# Patient Record
Sex: Female | Born: 1980 | Race: White | Hispanic: No | Marital: Married | Smoking: Never smoker
Health system: Southern US, Community
[De-identification: ages and names within clinical notes are randomized; demographics above are authoritative.]

## PROBLEM LIST (undated history)

## (undated) ENCOUNTER — Inpatient Hospital Stay (HOSPITAL_COMMUNITY): Payer: Self-pay

## (undated) DIAGNOSIS — E611 Iron deficiency: Secondary | ICD-10-CM

## (undated) DIAGNOSIS — K909 Intestinal malabsorption, unspecified: Secondary | ICD-10-CM

## (undated) DIAGNOSIS — G9332 Myalgic encephalomyelitis/chronic fatigue syndrome: Secondary | ICD-10-CM

## (undated) DIAGNOSIS — E039 Hypothyroidism, unspecified: Secondary | ICD-10-CM

## (undated) DIAGNOSIS — M1990 Unspecified osteoarthritis, unspecified site: Secondary | ICD-10-CM

## (undated) DIAGNOSIS — E559 Vitamin D deficiency, unspecified: Secondary | ICD-10-CM

## (undated) DIAGNOSIS — M51369 Other intervertebral disc degeneration, lumbar region without mention of lumbar back pain or lower extremity pain: Secondary | ICD-10-CM

## (undated) DIAGNOSIS — K2960 Other gastritis without bleeding: Secondary | ICD-10-CM

## (undated) DIAGNOSIS — Q998 Other specified chromosome abnormalities: Secondary | ICD-10-CM

## (undated) DIAGNOSIS — D509 Iron deficiency anemia, unspecified: Secondary | ICD-10-CM

## (undated) DIAGNOSIS — E568 Deficiency of other vitamins: Secondary | ICD-10-CM

## (undated) DIAGNOSIS — R569 Unspecified convulsions: Secondary | ICD-10-CM

## (undated) DIAGNOSIS — N12 Tubulo-interstitial nephritis, not specified as acute or chronic: Secondary | ICD-10-CM

## (undated) DIAGNOSIS — E538 Deficiency of other specified B group vitamins: Secondary | ICD-10-CM

## (undated) HISTORY — DX: Other gastritis without bleeding: K29.60

## (undated) HISTORY — DX: Iron deficiency: E61.1

## (undated) HISTORY — DX: Other specified chromosome abnormalities: Q99.8

## (undated) HISTORY — DX: Vitamin D deficiency, unspecified: E55.9

## (undated) HISTORY — DX: Other intervertebral disc degeneration, lumbar region without mention of lumbar back pain or lower extremity pain: M51.369

## (undated) HISTORY — DX: Deficiency of other specified B group vitamins: E53.8

## (undated) HISTORY — DX: Hypothyroidism, unspecified: E03.9

## (undated) HISTORY — DX: Myalgic encephalomyelitis/chronic fatigue syndrome: G93.32

## (undated) HISTORY — DX: Deficiency of other vitamins: E56.8

## (undated) HISTORY — PX: OTHER SURGICAL HISTORY: SHX169

## (undated) HISTORY — PX: COLONOSCOPY W/ ENDOSCOPIC US: SHX1379

## (undated) HISTORY — DX: Unspecified osteoarthritis, unspecified site: M19.90

## (undated) HISTORY — DX: Intestinal malabsorption, unspecified: K90.9

---
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
## 2001-04-29 ENCOUNTER — Ambulatory Visit (HOSPITAL_COMMUNITY): Admission: RE | Admit: 2001-04-29 | Discharge: 2001-04-29 | Payer: Self-pay | Admitting: *Deleted

## 2001-07-18 ENCOUNTER — Other Ambulatory Visit: Admission: RE | Admit: 2001-07-18 | Discharge: 2001-07-18 | Payer: Self-pay | Admitting: Obstetrics and Gynecology

## 2001-09-01 ENCOUNTER — Inpatient Hospital Stay (HOSPITAL_COMMUNITY): Admission: AD | Admit: 2001-09-01 | Discharge: 2001-09-01 | Payer: Self-pay | Admitting: *Deleted

## 2001-11-11 ENCOUNTER — Inpatient Hospital Stay (HOSPITAL_COMMUNITY): Admission: AD | Admit: 2001-11-11 | Discharge: 2001-11-13 | Payer: Self-pay | Admitting: Obstetrics and Gynecology

## 2002-01-02 ENCOUNTER — Other Ambulatory Visit: Admission: RE | Admit: 2002-01-02 | Discharge: 2002-01-02 | Payer: Self-pay | Admitting: Obstetrics and Gynecology

## 2002-05-03 ENCOUNTER — Other Ambulatory Visit: Admission: RE | Admit: 2002-05-03 | Discharge: 2002-05-03 | Payer: Self-pay | Admitting: Obstetrics and Gynecology

## 2002-09-14 ENCOUNTER — Other Ambulatory Visit: Admission: RE | Admit: 2002-09-14 | Discharge: 2002-09-14 | Payer: Self-pay | Admitting: Obstetrics and Gynecology

## 2003-05-25 ENCOUNTER — Emergency Department (HOSPITAL_COMMUNITY): Admission: EM | Admit: 2003-05-25 | Discharge: 2003-05-26 | Payer: Self-pay | Admitting: Emergency Medicine

## 2003-07-14 HISTORY — PX: OTHER SURGICAL HISTORY: SHX169

## 2003-08-16 ENCOUNTER — Inpatient Hospital Stay (HOSPITAL_COMMUNITY): Admission: AD | Admit: 2003-08-16 | Discharge: 2003-08-21 | Payer: Self-pay | Admitting: Orthopedic Surgery

## 2004-04-18 ENCOUNTER — Other Ambulatory Visit: Admission: RE | Admit: 2004-04-18 | Discharge: 2004-04-18 | Payer: Self-pay | Admitting: Obstetrics and Gynecology

## 2004-07-13 HISTORY — PX: WISDOM TOOTH EXTRACTION: SHX21

## 2005-01-02 ENCOUNTER — Other Ambulatory Visit: Admission: RE | Admit: 2005-01-02 | Discharge: 2005-01-02 | Payer: Self-pay | Admitting: Obstetrics and Gynecology

## 2005-01-30 ENCOUNTER — Other Ambulatory Visit: Admission: RE | Admit: 2005-01-30 | Discharge: 2005-01-30 | Payer: Self-pay | Admitting: Obstetrics and Gynecology

## 2005-05-08 ENCOUNTER — Other Ambulatory Visit: Admission: RE | Admit: 2005-05-08 | Discharge: 2005-05-08 | Payer: Self-pay | Admitting: Obstetrics and Gynecology

## 2005-09-04 ENCOUNTER — Other Ambulatory Visit: Admission: RE | Admit: 2005-09-04 | Discharge: 2005-09-04 | Payer: Self-pay | Admitting: Obstetrics and Gynecology

## 2006-04-13 ENCOUNTER — Emergency Department (HOSPITAL_COMMUNITY): Admission: EM | Admit: 2006-04-13 | Discharge: 2006-04-14 | Payer: Self-pay | Admitting: Emergency Medicine

## 2007-01-03 ENCOUNTER — Inpatient Hospital Stay (HOSPITAL_COMMUNITY): Admission: AD | Admit: 2007-01-03 | Discharge: 2007-01-03 | Payer: Self-pay | Admitting: Obstetrics and Gynecology

## 2007-05-30 ENCOUNTER — Inpatient Hospital Stay (HOSPITAL_COMMUNITY): Admission: AD | Admit: 2007-05-30 | Discharge: 2007-05-30 | Payer: Self-pay | Admitting: Obstetrics and Gynecology

## 2007-07-14 HISTORY — PX: OTHER SURGICAL HISTORY: SHX169

## 2007-08-09 ENCOUNTER — Inpatient Hospital Stay (HOSPITAL_COMMUNITY): Admission: RE | Admit: 2007-08-09 | Discharge: 2007-08-12 | Payer: Self-pay | Admitting: Obstetrics and Gynecology

## 2007-08-09 ENCOUNTER — Encounter (INDEPENDENT_AMBULATORY_CARE_PROVIDER_SITE_OTHER): Payer: Self-pay | Admitting: Obstetrics and Gynecology

## 2010-11-25 NOTE — Discharge Summary (Signed)
Meredith [name], DYMOND             ACCOUNT NO.:  [id number]   MEDICAL RECORD NO.:  [id number]          PATIENT TYPE:  INP   LOCATION:  9143                          [facility]ILITY[facility]   PHYSICIAN:  Osborn Coho, M.D.   DATE OF BIRTH:  06-Mar-1981   DATE OF ADMISSION:  08/09/2007  DATE OF DISCHARGE:  08/12/2007                               DISCHARGE SUMMARY   DISCHARGING PHYSICIAN:  Hal Morales, M.D.   ADMISSION DIAGNOSIS:  1. Intrauterine pregnancy at 39 weeks.  2. History of shoulder dystocia.  3. Desires primary low transverse cesarean section.   DISCHARGE DIAGNOSES:  1. Intrauterine pregnancy at 39 weeks.  2. History of shoulder dystocia.  3. Desires primary low transverse cesarean section.  4. Status post cesarean delivery of a female named [place], Apgars 9 and      9, weighing 8 pounds 11 ounces.   HOSPITAL PROCEDURES:  1. Spinal anesthesia.  2. Primary low transverse cesarean section.   HOSPITAL COURSE:  The patient was admitted for an elective cesarean  section due to a history of shoulder dystocia with her last baby.  She  was delivered of a female weighing 8 pounds 11 ounces, Apgars 9 and 9, by  Dr. Su Hilt.  EBL was 800 mL.  There were no complications.  She was  taken to recovery and then to mother/baby unit where she received  routine postop care.  Postop day #1, she was doing well, tolerating diet  and hemoglobin was 10.80.  Postop day #2, she was doing well, bottle-  feeding, vital signs were stable.  Incision was healing.  She continued  to receive routine care.  On postop day #3, she was ready to go home.  Vital signs were stable.  She was afebrile.  Chest was clear.  Heart  rate regular rate and rhythm.  Abdomen: Soft appropriately tender.  Incision was clean, dry and intact.  Extremities within normal limits.  Lochia was within normal limits.  She was deemed to receive full benefit  of her hospital stay and was discharged.   DISCHARGE MEDICATIONS:  1.  Motrin 600 mg p.o. q.6 h p.r.n.  2. Tylox 1-2 p.o. q.4 h p.r.n.   DISCHARGE LABORATORIES:  White blood cell 10.80, hemoglobin 10.8,  platelets 146, RPR nonreactive.   DISCHARGE INSTRUCTIONS:  Per [facility] OB handout.  Discharge follow up in 4  weeks for cultures with IUD placement 2 weeks later.      Marie L. Williams, C.N.M.      Osborn Coho, M.D.  Electronically Signed    MLW/MEDQ  D:  08/12/2007  T:  08/12/2007  Job:  [id number]

## 2010-11-25 NOTE — Op Note (Signed)
Meredith [name], Meredith [name]             ACCOUNT NO.:  [id number]   MEDICAL RECORD NO.:  [id number]          PATIENT TYPE:  INP   LOCATION:  9143                          [facility]   PHYSICIAN:  Osborn Coho, M.D.   DATE OF BIRTH:  September 05, 1980   DATE OF PROCEDURE:  08/09/2007  DATE OF DISCHARGE:                               OPERATIVE REPORT   PREOPERATIVE DIAGNOSIS:  1. 39 weeks.  2. History of shoulder dystocia.  3. Desires primary low transverse cesarean section.   POSTOPERATIVE DIAGNOSIS:  1. 39 weeks.  2. History of shoulder dystocia.  3. Desires primary low transverse cesarean section.   PROCEDURE:  Primary low transverse cesarean section.   ATTENDING PHYSICIAN:  Osborn Coho, M.D.   ASSISTANT:  Larna Daughters, CNM   ANESTHESIA:  Spinal.   FINDINGS:  Live female infant with Apgars of 9 at one minute and 9 at five  minutes weighing 8 pounds 11 ounces, [place].  Placenta to pathology.   FLUIDS REPLACED:  3500 mL.   URINE OUTPUT:  1300 mL.   ESTIMATED BLOOD LOSS:  800 mL.   COMPLICATIONS:  None.   DESCRIPTION OF PROCEDURE:  The patient was taken to the operating room  after the risks, benefits and alternatives were reviewed with the  patient, the patient verbalized understanding, consent signed and  witnessed.  The patient was given a spinal per anesthesia and prepped  and draped in the normal sterile fashion.  A Pfannenstiel skin incision  was made and carried down to the underlying layer of fascia with the  scalpel and Bovie.  The fascia was excised bilaterally in the midline  and extended bilaterally with the Mayo scissors.  Kocher clamps were  placed on the inferior aspect of the fascial incision and the rectus  muscle excised from the fascia.  The same was done on the superior  aspect of the fascial incision.  The muscle was separated in the midline  and the peritoneum entered bluntly and extended manually.  A bladder  blade was placed and bladder  flap created with Metzenbaum scissors.  The  uterine incision was made with the scalpel and extended bilaterally with  the bandage.  The infant was delivered in a vertex presentation, cord  clamped and cut, and the infant handed to the awaiting pediatricians.  A  gram of Cefoxitin was administered.  The placenta was removed via fundal  massage and the uterus cleared of all clots and debris.  The uterine  incision was repaired with 0 Vicryl in a running locked fashion.  A  second imbricating layer was performed.  The intra-abdominal cavity was  copiously irrigated and cleared of all clots and debris.  The uterine  incision was noted to have some bleeding in several locations which was  ligated with 0 Vicryl via figure-of-eight stitches and interrupted  stitches.  There was still a small amount of oozing noted and the  decision was made to apply Surgicel to the incision which was done.  The  peritoneum was repaired with 2-0 chromic in a running fashion.  The  fascia was repaired  with 0 Vicryl in a running fashion.  The  subcutaneous tissue was irrigated and made hemostatic with the Bovie.  Three interrupted stitches of 0 plain stitches were placed in the  subcutaneous tissues and the skin was reapproximated using 3-0 Monocryl  subcuticular stitch.  1/2 inch Steri-Strips were applied with Benzoin  and a pressure dressing applied, as well.  Sponge, lap and needle counts  were correct.  The patient tolerated the procedure well and is about to  be transferred to the recovery room in good condition.      Osborn Coho, M.D.  Electronically Signed     AR/MEDQ  D:  08/09/2007  T:  08/09/2007  Job:  [id number]

## 2010-11-25 NOTE — H&P (Signed)
NAMESTEHANIE, [name]             ACCOUNT NO.:  [id number]   MEDICAL RECORD NO.:  [id number]          PATIENT TYPE:  INP   LOCATION:  NA                            [facility]   PHYSICIAN:  Osborn Coho, M.D.   DATE OF BIRTH:  1980/11/24   DATE OF ADMISSION:  DATE OF DISCHARGE:                              HISTORY & PHYSICAL   HISTORY OF PRESENT ILLNESS:  [name] is a single white female,  gravida 3, para 2-0-1-1, who presents on the date of admission for a  scheduled primary elective C-section secondary to a history of shoulder  dystocia.  The patient's care has been followed by MD service at [facility].   The patient's history is remarkable for:  1. Rh negative.  2. History of abnormal Pap and positive HPV.  3. History of shoulder dystocia.  4. Group beta Strep negative.  5. Desires elective primary low transverse cesarean section secondary      to shoulder dystocia with previous delivery.   PRENATAL LABS:  The patient's blood type is O negative.  Rh antibody  screen was positive for anti-D on January 28, 2007.  Hemoglobin at the time  was 12.8, platelets were 258, RPR was nonreactive, rubella titer was  immune, hepatitis surface antigen was negative, HIV nonreactive, cystic  fibrosis negative.  First trimester screen on July 28 was within normal  limits.  The patient did have an elevated one hour GTT on November 11  equal to 141.  RPR was nonreactive.  The patient's three-hour GTT was  within normal limits.  The patient received RhoGAM in June of 08.  Her  group beta Strep was negative December 10 and GC and chlamydia cultures  were also negative.   OBSTETRICAL HISTORY:  Gravida 1.  The patient had a vacuum-assisted  vaginal delivery in May of 03, a female infant weighing 8 pounds 8 ounces  at [redacted] weeks gestation, in labor approximately 5 hours, did have a  shoulder dystocia.  Delivery per Dr. Estanislado Pandy.  The patient received  RhoGAM after delivery and son's  name, Meredith [name].  Rhett Bannister 2 was a  spontaneous abortion in February of 08, and gravida 3 is current  pregnancy.   ALLERGIES:  SHE REPORTS A MORPHINE ALLERGY CAUSING AGITATION AND CARDIAC  ARREST.  She denies latex sensitivity or other allergies.   PAST MEDICAL HISTORY:  The patient reports abnormal Pap smear in the  past in 2007 and had a subsequent colposcopy, a positive HPV.  The  patient was treated for chlamydia in 2001.  She is Rh negative.  She  received RhoGAM in May of 2003 and as well as June of 2008.  Reports  normal childhood illnesses, rubella vaccine as well as hepatitis B  vaccine and Gardasil vaccine x3.  The patient had bladder infection in  June of 08.  The patient reports a seizure at birth.  The patient  reports a Staph infection in her right arm in 2005.   PAST SURGICAL HISTORY:  She reports wisdom teeth extraction in 2006.   FAMILY HISTORY:  The patient reports  maternal grandfather with previous  MI, maternal grandmother with chronic hypertension, maternal grandfather  insulin dependent diabetes, maternal grandmother with thyroid  dysfunction, maternal grandmother dementia, maternal grandmother colon  cancer, the patient's mother skin cancer.  She reports her mother,  maternal aunt and father with nicotine addictions.   GENETIC HISTORY:  Unremarkable.   SOCIAL HISTORY:  The patient is a single white female.  She reports  Christian faith.  Father of the baby's name is Renda [name].  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  Father of the baby 11 years of education and is a full-time  Personnel officer.  She reported occasional alcohol use on weekends prior to  pregnancy.  She denied any tobacco use or illicit drug use.   HISTORY OF PRESENT PREGNANCY:  The patient had a new OB interview January 28, 2007 at approximately 11-3/[redacted] weeks gestation.  She had her new OB  workup as well the same day.  Routines were discussed, Dr. Estanislado Pandy  as  primary.  Pap, gonorrhea and chlamydia cultures were done.  Wet prep was  positive for bacterial vaginosis and was prescribed metronidazole.  The  patient had a first trimester screen on the 28th of July which was  within normal limits.  The patient had a normal AFP done on August the  15 which was also within normal limits.  The patient had an anatomy  ultrasound at 19-4/7 weeks showing normal growth and development, normal  fluids.  Cervix was 4.1 cm in length, and anterior placenta was noted.  The patient was without complaints.  The patient did measure size  greater than dates on a 23-4/7 weeks visit.  The patient had ultrasound  for estimated fetal weighs secondary to size greater than date.  The  patient did voice nervousness and concern regarding shoulder dystocia  with previous delivery and plan was made to have ultrasound for  estimated fetal weight at 36 to 37 weeks, and options were reviewed with  the patient.  The patient had one-hour GTT that was elevated, equal to  141 at 28-1/7 weeks.  Ultrasound showed estimated fetal weight 3 pounds,  1 ounce which is 93rd percentile with normal fluid, anterior placenta,  vertex presentation.  The patient was given a prescription for RhoGAM.  RPR was nonreactive at that time.  The patient was scheduled for a three-  hour GTT which was subsequently within normal limits.  The patient was  seen at 32-2/7 weeks for rule out preterm labor.  Cervix was closed.  Fetal fibronectin was obtained which was negative.  An NST was reactive  with a few uterine contractions but no regular pattern.  The patient had  a repeat ultrasound at 37-2/7 weeks.  Vertex presentation was noted.  Estimated fetal weight 7 pounds 8 ounces, 83rd percentile with normal  fluid.  MD discussed ultrasound results with patient.  The patient was  offered primary low transverse cesarean section secondary to a bad  experience with shoulder dystocia last pregnancy.   Subsequently, the  risks, benefits and alternatives were discussed with the patient, and  the patient desired scheduled elective primary low transverse cesarean  section.   PHYSICAL EXAMINATION:  VITAL SIGNS:  Stable.  The patient is afebrile.  HEENT:  Within normal limits.  LUNGS:  Breath sounds are clear to auscultation bilaterally.  HEART:  Regular rate and rhythm without murmur.  BREASTS:  Soft, nontender and intact.  ABDOMEN:  Fundal  height approximately 39 cm.  Estimated fetal weight 8  to 8-1/2 pounds.  HEART:  Heart rate has been in the 130s most recently in the office.  PELVIC EXAM:  Deferred.  EXTREMITIES:  Deep tendon reflexes are within normal limits and without  clonus.  The patient has no edema.   IMPRESSION:  1. Intrauterine pregnancy at 39-1/7 weeks.  2. Desires primary low transverse cesarean section secondary to a      history of shoulder dystocia with previous delivery.  3. Group beta Strep negative.   PLAN:  1. Admit to [facility] for consult with Dr. Osborn Coho as attending physician.  2. Routine physician preoperative orders.  3. Risks and benefits of primary elective cesarean section were      reviewed with the patient by Dr. Su Hilt, and the patient does wish      to continue to proceed with plan of delivery secondary to primary      low transverse C-section.      Candice Whipholt, [place]      Osborn Coho, M.D.  Electronically Signed    CHS/MEDQ  D:  08/08/2007  T:  08/08/2007  Job:  [id number]

## 2010-11-28 NOTE — Discharge Summary (Signed)
[name], Meredith [name]                         ACCOUNT NO.:  [id number]   MEDICAL RECORD NO.:  [id number]                   PATIENT TYPE:  INP   LOCATION:  5532                                 FACILITY:  [facility]   PHYSICIAN:  Artist Pais. Mina Marble, M.D.           DATE OF BIRTH:  [birth year]   DATE OF ADMISSION:  08/16/2003  DATE OF DISCHARGE:  08/21/2003                                 DISCHARGE SUMMARY   BRIEF HISTORY:  The patient was seen in the emergency room on August 16, 2003; found to have a probable methicillin-resistant Staphylococcus aureus  infection of the right wrist, first dorsal compartment, with abscess.  She  had a few days of increasing redness, swelling and pain.  She is an  otherwise healthy 30 year old white female.  She was taken to the operating  room for irrigation and debridement of the right wrist first dorsal  compartment abscess with partial wound closure.  Prior to this, she had been  on p.o. antibiotics for 48 hours.  She was admitted postoperatively for IV  vancomycin for a presumed methicillin-resistant Staphylococcus aureus  infection.  Social Services was consulted for discharge planning.  First day  postoperatively vital signs were stable.  Plans were made to take her back  to the operating room on August 17, 2003, for I&D and secondary wound  closure.  Continue on IV vancomycin 1 gm q.12h., NPO after midnight.  Patient remained comfortable and afebrile during her hospitalization.  Her  dressing was dry and intact.  Taken back to the operating room on August 20, 2003, for repeat I&D and secondary wound closure.  On August 21, 2003,  postoperatively day #1, surgery #3, patient was comfortable.  Vital signs  stable.  Wound dressing clean and dry.  Discharged home to follow up on  August 24, 2003, in our office; contact our office at [phone] for  followup appointment.   PERTINENT LABORATORY FINDINGS:  White count 8.0, hemoglobin 11.8, hematocrit  33.9, platelet count 201,000.  Sodium 136, potassium 3.4.  Wound cultures  grew out methicillin-resistant Staphylococcus aureus.   DISCHARGE CONDITION:  Stable and improved.   DISCHARGE INSTRUCTIONS:  Follow up with Dr. Mina Marble on August 24, 2003.  Contact our office at [phone] for appointment.  Discharged home on IV  vancomycin 1 gm IV q.12h. x 2 weeks followed by doxycycline p.o.  Home  health dressing changes.  Contact office prior to followup if she has any  questions or concerns.      Aura Fey Bobbe Medico.                       Artist Pais Mina Marble, M.D.    SCI/MEDQ  D:  09/27/2003  T:  10/01/2003  Job:  [id number]

## 2010-11-28 NOTE — H&P (Signed)
[name], [name]                         ACCOUNT NO.:  [id number]   MEDICAL RECORD NO.:  [id number]                   PATIENT TYPE:  INP   LOCATION:  5531                                 FACILITY:  [facility]   PHYSICIAN:  Artist Pais. Mina Marble, M.D.           DATE OF BIRTH:  [birth year]   DATE OF ADMISSION:  08/16/2003  DATE OF DISCHARGE:                                HISTORY & PHYSICAL   PREOPERATIVE DIAGNOSIS:  Abscess and methicillin-resistant staphylococcus  aureus infection in the right wrist, first dorsal compartment.   POSTOPERATIVE DIAGNOSIS:  Abscess and methicillin-resistant staphylococcus  aureus in the right wrist, first dorsal compartment.   PROCEDURE:  Irrigation and debridement of the above with partial wound  closure.   SURGEON:  Dr. Mina Marble.   ASSISTANT:  Orlin Hilding, PA-C.   ANESTHESIA:  General.   TOURNIQUET TIME:  Thirty minutes.   COMPLICATIONS:  None.   DRAINS:  None.   OPERATION:  Patient was taken to the operating room.  After the induction of  general endotracheal anesthesia, the right upper extremity was prepped and  draped in the usual sterile fashion.  Esmarch was used to exsanguinate the  limb.  Tourniquet was inflated to 250 mmHg.  At this point in time, the  right MRSA infection in the wrist area was irrigated and debrided using 6  liters of normal saline using a power irrigator.  Devitalized tissue was  debrided down to the normal, good tissue.  The wound was partially closed  with 3-0 nylon.  There were tacking-type sutures x3, and the remaining parts  were packed open with iodoform gauze and placed in a dressing of Xeroform,  4x4s, ABDs and a compressive wrap.  Patient tolerated the procedure well and  was sent to the recovery room in a stable fashion.   OPERATIVE REPORT:                                                Artist Pais. Mina Marble, M.D.    MAW/MEDQ  D:  08/20/2003  T:  08/20/2003  Job:  [id number]

## 2010-11-28 NOTE — Consult Note (Signed)
NAMECHRISLYN, SEEDORF                         ACCOUNT NO.:  [id number]   MEDICAL RECORD NO.:  [id number]                   PATIENT TYPE:  INP   LOCATION:  ED                                   FACILITY:  [facility]   PHYSICIAN:  Artist Pais. Mina Marble, M.D.           DATE OF BIRTH:  09/24/1980   DATE OF CONSULTATION:  08/16/2003  DATE OF DISCHARGE:                                   CONSULTATION   ORTHOPEDIC SURGERY EMERGENCY ROOM CONSULTATION:   PHYSICIAN REQUESTING CONSULTATION:  Devoria Albe, M.D.   REASON FOR CONSULTATION:  Raja Caputi is a 30 year old right-hand  dominant female who presents today with an acute abscess over the first  dorsal compartment of her dominant right wrist that has been there  approximately 72 hours which has gotten worse over the past 48 hours despite  p.o. antibiotics and one attempted aspiration.  She is an otherwise healthy  30 year old right-hand dominant female with no known drug allergies, no  current medications, no recent hospitalization or surgery.   FAMILY MEDICAL HISTORY:  Noncontributory.   SOCIAL HISTORY:  Noncontributory.   EXAMINATION TODAY:  She has an acute abscess over the first dorsal  compartment.  She has a small area of demarcation from previous physicians  at [facility] who marked this with ink and it has spread at least 75-80% of  its original diameter, it is traveling up the forearm, it is painful with  deep palpation, and she has pain with any movement of first dorsal  compartment tendons and significant swelling and erythema over an 8 x 6 cm  area.  No adenopathy is noted in the epitrochlear or cervical area.  She has  pain with active flexion-extension of the thumb, no pain with extension or  flexion of the digits.   IMPRESSION:  [age]-year-old female with an acute abscess on her  dominant right wrist first dorsal compartment area after being followed for  the past 72 hours at [facility] including p.o. antibiotics and one  attempted  aspiration now with significant worsening.   RECOMMENDATIONS:  At this point in time we will take her to the operating  room for incision and drainage, culturing, we will put her on IV vancomycin  for a presumed methicillin-resistant Staphylococcus aureus infection and  repeat I&D in 48 hours with secondary wound closure if possible.                                               Artist Pais Mina Marble, M.D.    MAW/MEDQ  D:  08/15/2003  T:  08/16/2003  Job:  [id number]

## 2010-11-28 NOTE — H&P (Signed)
[facility]  Patient:    Meredith [name], Meredith [name] Visit Number: [id number] MRN: [id number]          Service Type: OBS Location: [id number] Attending Physician:  Esmeralda Arthur Dictated by:   Nigel Bridgeman, C.N.M. Admit Date:  11/11/2001                           History and Physical  CHIEF COMPLAINT:              [name] is a 30 year old gravida 1 para 0 at 38-2/7th weeks, who presents for admission from the office secondary to cervix 5 cm, active labor, and leaking clear fluid.  HISTORY OF PRESENT ILLNESS:   She was seen on Nov 10, 2001 at the office with the cervix 3 cm.  Pregnancy has been remarkable for:                               1. First trimester Chlamydia.                               2. Abnormal Pap smear.                               3. Low-grade SIL with HPV changes, with a plan                                  made for colposcopy postpartum.                               4. Rh negative.  PRENATAL LABORATORY DATA:     Blood type is O-negative.  Rh antibody negative. VDRL nonreactive.  Rubella titer positive.  Hepatitis B surface antigen negative.  HIV nonreactive.  GC and Chlamydia cultures were negative.  Pap smear at initial visit showed low-grade SIL with HPV changes.  [facility]  She also had a colposcopy and Pap smear done at approximately 22 weeks.  Plan was made to repeat it in April 2003 and plan a colposcopy postpartum.  The patient did receive RhoGAM.  Pap smear was unchanged in January 2003 and the plan was made to repeat this every three months.  Group B strep culture was negative at 36 weeks.  EDC of Nov 23, 2001 was established by last menstrual period and was in agreement with ultrasound at approximately 18 weeks and 10 weeks.  HISTORY PRESENT PREGNANCY:    Patient entered care at [facility] at 15 weeks.  She had a new OB visit at the [facility] in  October 2002.  She was treated for Chlamydia at that time.  She was told she had an abnormal Pap smear.  She had another ultrasound at 18 weeks that was within normal limits.  She had a colposcopy at 23 weeks.  The rest of her pregnancy was essentially uncomplicated.  PAST MEDICAL HISTORY:         1. She was a previous oral contraceptive user.  2. She had an abnormal Pap smear in 2002.                               3. She had the usual childhood illnesses.                               4. She had seizures as a baby but none since.                                  This was secondary to a skull fracture noted                                  at birth.  There is no further information                                  noted about this.  ALLERGIES:                    She has no known medication allergies.  FAMILY HISTORY:               Maternal grandfather had an MI.  Maternal grandfather also had chronic hypertension.  Maternal grandmother and maternal grandfather had hypertension and heart disease.  Maternal grandfather is a diabetic, who is on "shots and pills."  Paternal grandmother has thyroid problems.  Maternal grandmother had polyps of the colon.  Her mother had a stroke.  GENETIC HISTORY:              Unremarkable.  SOCIAL HISTORY:               Present is single.  The father of the baby is currently involved and supportive.  His name is Sharlette Dense.  The patient is Caucasian, of the [place] faith.  She had been followed initially by the Certified Nurse Midwife service but then transferred to physician care late in her pregnancy per her request.  She has two years of college.  She is employed at a [employer].  Her partner has an eleventh grade education.  He is also employed.  She denies any alcohol, drug, or tobacco use during this pregnancy.  PHYSICAL EXAMINATION:  VITAL SIGNS:                  Stable.  The patient is afebrile.  HEENT:                         Within normal limits.  LUNGS:                        Bilateral breath sounds are clear.  HEART:                        Regular rate and rhythm without murmurs.  BREAST:                       Soft, nontender.  ABDOMEN:                      Fundal  height approximately 38 cm.  Estimated fetal weight 7-1/2 to 8 pounds.  Uterine contractions are every three to four minutes, moderate quality.  PELVIC:                       Cervical examination in the office by Wynelle Bourgeois, CNM was 5 cm, 100%, vertex at a -1 station with bulging bag of waters noted, although there was positive leaking of clear fluid also noted. Fetal heart rate is reassuring with no decelerations.  EXTREMITIES:                  Deep tendon reflexes are 2+ without clonus. There is trace edema noted.  IMPRESSION:                   1. Intrauterine pregnancy at 38-2/7th weeks.                               2. Active labor.                               3. History of abnormal Pap smear with need for                                  colposcopy postpartum.                               4. Rh negative.  PLAN:                         1. Admit to birthing suite for consult with                                  Dr. Silverio Lay, attending physician.                               2. Routine physician orders. Dictated by:   Nigel Bridgeman, C.N.M. Attending Physician:  Esmeralda Arthur DD:  11/11/01 TD:  11/11/01 Job: [id number]

## 2010-11-28 NOTE — Op Note (Signed)
Meredith [name], Meredith [name]                         ACCOUNT NO.:  [id number]   MEDICAL RECORD NO.:  [id number]                   PATIENT TYPE:  INP   LOCATION:  NA                                   FACILITY:  [facility]   PHYSICIAN:  Artist Pais. Mina Marble, M.D.           DATE OF BIRTH:  Dec 02, 1980   DATE OF PROCEDURE:  08/16/2003  DATE OF DISCHARGE:                                 OPERATIVE REPORT   PREOPERATIVE DIAGNOSIS:  Septic right wrist first dorsal compartment.   POSTOPERATIVE DIAGNOSIS:  Septic right wrist first dorsal compartment.   PROCEDURE:  Irrigation debridement and release of first dorsal compartment.   SURGEON:  Artist Pais. Mina Marble, M.D.   ASSISTANT:  None.   ANESTHESIA:  General.   TOURNIQUET TIME:  Was 30 minutes.   COMPLICATIONS:  None.   The wound was packed open.   OPERATIVE REPORT:  The patient was taken to the operating room with the  induction of adequate general anesthesia.  Right upper extremity was prepped  and draped in the usual sterile fashion.  Esmarch was used to exsanguinate  the limb.  Tourniquet was then inflated to 150 mmHg at this point in time.  A longitudinal incision was made over the dorsal radial aspect of the right  wrist in line with the radial styloid, going for approximately 6-cm.  The  incision was taken down through the skin, subcutaneous tissues.  Once the  subcutaneous tissues were identified, purulent material was encountered.  This was cultured for both aerobic, anaerobic, fungal, etcetera.  The  incision was deepened and dorsal and volar flaps were raised.  There was  significant chronic infection overlying the superficial radial nerve, the  radial artery, the venae comitantes, as well as into the first dorsal  compartment. The first dorsal compartment was opened and purulent material  was encountered here.  After the entire wound was thoroughly inspected, it  was irrigated with a _________ irrigator using 6 liters of normal saline.  This was then followed by open packing of the wound with a 1/2 inch iodoform  gauze, followed by 4 x 4's, an ABD pad, Kerlix cling, and a compressive  Coban wrapping.  The patient tolerated the procedure well.  Went to the  recovery room in a stable fashion.                                               Artist Pais Mina Marble, M.D.    MAW/MEDQ  D:  08/16/2003  T:  08/16/2003  Job:  [id number]

## 2010-11-28 NOTE — Op Note (Signed)
Meredith [name], Meredith [name]                         ACCOUNT NO.:  [id number]   MEDICAL RECORD NO.:  [id number]                   PATIENT TYPE:  INP   LOCATION:  5531                                 FACILITY:  [facility]   PHYSICIAN:  Artist Pais. Mina Marble, M.D.           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   POSTOPERATIVE DIAGNOSIS:  Chronic methicillin-resistant staphylococcus  aureus.   PROCEDURE:  Irrigation and debridement and secondary wound closure.   SURGEON:  Artist Pais. Mina Marble, M.D.   ASSISTANT:  R.N.   ANESTHESIA:  General.   TOURNIQUET TIME:  21 minutes.   COMPLICATIONS:  No complications or drains.   DESCRIPTION OF PROCEDURE:  The patient was taken to the operating room for  induction of adequate general anesthesia.  The right upper extremity was  prepped and draped in the usual sterile fashion.  An Esmarch was used to  exsanguinate the limb.  Tourniquet was inflated to 250 mmHg at this point in  time.  The right wrist area which had been previously partially closed was  examined again and the protection sutures were removed.  The wound was  throughout opened and irrigated with two liters of normal saline.  The  devitalized tissue was debrided.  At this point in time, a secondary wound  closure was performed using a 3-0 Prolene subcuticular stitch, and three 3-0  nylon retention sutures to decrease the pressure on the wound edges.  It was  then placed in a sterile dressing and Xeroform 4x4, wrapped in compressive  dressing.  The patient tolerated the procedure well and went to recovery in  stable fashion.                                               Artist Pais Mina Marble, M.D.    MAW/MEDQ  D:  08/20/2003  T:  08/20/2003  Job:  [id number]

## 2011-04-02 LAB — CBC
Hemoglobin: 10.8 — ABNORMAL LOW
Platelets: 216
RBC: 3.53 — ABNORMAL LOW
RDW: 13.6
WBC: 10.8 — ABNORMAL HIGH
WBC: 11.4 — ABNORMAL HIGH

## 2011-04-02 LAB — RH IMMUNE GLOB WKUP(>/=20WKS)(NOT WOMEN'S HOSP): Fetal Screen: NEGATIVE

## 2011-04-21 LAB — RH IMMUNE GLOBULIN WORKUP (NOT WOMEN'S HOSP): Antibody Screen: NEGATIVE

## 2011-04-29 LAB — RH IMMUNE GLOBULIN WORKUP (NOT WOMEN'S HOSP)
ABO/RH(D): O NEG
Antibody Screen: NEGATIVE

## 2011-04-29 LAB — HCG, QUANTITATIVE, PREGNANCY: hCG, Beta Chain, Quant, S: 148922 — ABNORMAL HIGH

## 2011-06-20 ENCOUNTER — Ambulatory Visit (INDEPENDENT_AMBULATORY_CARE_PROVIDER_SITE_OTHER): Payer: BC Managed Care – PPO

## 2011-06-20 DIAGNOSIS — R112 Nausea with vomiting, unspecified: Secondary | ICD-10-CM

## 2011-06-20 DIAGNOSIS — R509 Fever, unspecified: Secondary | ICD-10-CM

## 2011-06-20 DIAGNOSIS — R1084 Generalized abdominal pain: Secondary | ICD-10-CM

## 2011-06-21 ENCOUNTER — Encounter (HOSPITAL_COMMUNITY): Payer: Self-pay | Admitting: *Deleted

## 2011-06-21 ENCOUNTER — Inpatient Hospital Stay (HOSPITAL_COMMUNITY)
Admission: AD | Admit: 2011-06-21 | Discharge: 2011-06-23 | DRG: 321 | Disposition: A | Discharge status: Home / Self Care | Payer: BC Managed Care – PPO | Source: Ambulatory Visit | Attending: Family Medicine | Admitting: Family Medicine

## 2011-06-21 ENCOUNTER — Inpatient Hospital Stay (HOSPITAL_COMMUNITY): Payer: BC Managed Care – PPO

## 2011-06-21 ENCOUNTER — Ambulatory Visit (INDEPENDENT_AMBULATORY_CARE_PROVIDER_SITE_OTHER): Payer: BC Managed Care – PPO

## 2011-06-21 DIAGNOSIS — N12 Tubulo-interstitial nephritis, not specified as acute or chronic: Secondary | ICD-10-CM | POA: Diagnosis present

## 2011-06-21 DIAGNOSIS — N200 Calculus of kidney: Secondary | ICD-10-CM | POA: Diagnosis present

## 2011-06-21 DIAGNOSIS — E860 Dehydration: Secondary | ICD-10-CM

## 2011-06-21 DIAGNOSIS — R112 Nausea with vomiting, unspecified: Secondary | ICD-10-CM

## 2011-06-21 DIAGNOSIS — R109 Unspecified abdominal pain: Secondary | ICD-10-CM | POA: Diagnosis present

## 2011-06-21 HISTORY — DX: Unspecified convulsions: R56.9

## 2011-06-21 HISTORY — DX: Tubulo-interstitial nephritis, not specified as acute or chronic: N12

## 2011-06-21 LAB — COMPREHENSIVE METABOLIC PANEL
ALT: 10 U/L (ref 0–35)
AST: 10 U/L (ref 0–37)
Albumin: 3.1 g/dL — ABNORMAL LOW (ref 3.5–5.2)
Alkaline Phosphatase: 59 U/L (ref 39–117)
Calcium: 8.4 mg/dL (ref 8.4–10.5)
Potassium: 3.7 mEq/L (ref 3.5–5.1)
Sodium: 141 mEq/L (ref 135–145)
Total Protein: 6.4 g/dL (ref 6.0–8.3)

## 2011-06-21 LAB — URINE MICROSCOPIC-ADD ON

## 2011-06-21 LAB — CBC
Platelets: 218 10*3/uL (ref 150–400)
RBC: 3.81 MIL/uL — ABNORMAL LOW (ref 3.87–5.11)
WBC: 7.3 10*3/uL (ref 4.0–10.5)

## 2011-06-21 LAB — URINALYSIS, ROUTINE W REFLEX MICROSCOPIC
Ketones, ur: 40 mg/dL — AB
Nitrite: NEGATIVE
Specific Gravity, Urine: 1.015 (ref 1.005–1.030)
pH: 6 (ref 5.0–8.0)

## 2011-06-21 LAB — DIFFERENTIAL
Lymphocytes Relative: 30 % (ref 12–46)
Lymphs Abs: 2.2 10*3/uL (ref 0.7–4.0)
Neutrophils Relative %: 56 % (ref 43–77)

## 2011-06-21 LAB — RAPID URINE DRUG SCREEN, HOSP PERFORMED
Amphetamines: NOT DETECTED
Tetrahydrocannabinol: POSITIVE — AB

## 2011-06-21 LAB — TSH: TSH: 0.972 u[IU]/mL (ref 0.350–4.500)

## 2011-06-21 MED ORDER — ACETAMINOPHEN 325 MG PO TABS
650.0000 mg | ORAL_TABLET | Freq: Four times a day (QID) | ORAL | Status: DC | PRN
  Administered 2011-06-22 (×3): 650 mg via ORAL
  Filled 2011-06-21 (×3): qty 2

## 2011-06-21 MED ORDER — ZOLPIDEM TARTRATE 5 MG PO TABS
5.0000 mg | ORAL_TABLET | Freq: Every day | ORAL | Status: DC
  Administered 2011-06-22: 5 mg via ORAL
  Filled 2011-06-21: qty 1

## 2011-06-21 MED ORDER — ONDANSETRON HCL 4 MG/2ML IJ SOLN: 4.0000 mg | Freq: Four times a day (QID) | INTRAMUSCULAR | Status: DC | PRN

## 2011-06-21 MED ORDER — ENOXAPARIN SODIUM 40 MG/0.4ML ~~LOC~~ SOLN
40.0000 mg | SUBCUTANEOUS | Status: DC
  Administered 2011-06-22: 40 mg via SUBCUTANEOUS
  Filled 2011-06-21 (×3): qty 0.4

## 2011-06-21 MED ORDER — POLYETHYLENE GLYCOL 3350 17 G PO PACK
17.0000 g | PACK | Freq: Every day | ORAL | Status: DC | PRN
  Filled 2011-06-21: qty 1

## 2011-06-21 MED ORDER — ACETAMINOPHEN 650 MG RE SUPP: 650.0000 mg | Freq: Four times a day (QID) | RECTAL | Status: DC | PRN

## 2011-06-21 MED ORDER — ONDANSETRON HCL 4 MG PO TABS
4.0000 mg | ORAL_TABLET | Freq: Four times a day (QID) | ORAL | Status: DC | PRN
  Administered 2011-06-21 – 2011-06-22 (×2): 4 mg via ORAL
  Filled 2011-06-21 (×2): qty 1

## 2011-06-21 MED ORDER — OXYCODONE HCL 5 MG PO TABS: 5.0000 mg | ORAL_TABLET | ORAL | Status: DC | PRN

## 2011-06-21 MED ORDER — SODIUM CHLORIDE 0.9 % IV SOLN
INTRAVENOUS | Status: DC
  Administered 2011-06-22: 03:00:00 via INTRAVENOUS

## 2011-06-21 MED ORDER — DEXTROSE 5 % IV SOLN
1.0000 g | INTRAVENOUS | Status: DC
  Administered 2011-06-22: 1 g via INTRAVENOUS
  Filled 2011-06-21 (×3): qty 10

## 2011-06-21 MED ORDER — IOHEXOL 300 MG/ML  SOLN
100.0000 mL | Freq: Once | INTRAMUSCULAR | Status: AC | PRN
  Administered 2011-06-21: 100 mL via INTRAVENOUS

## 2011-06-21 MED ORDER — HYDROMORPHONE HCL PF 1 MG/ML IJ SOLN: 0.5000 mg | INTRAMUSCULAR | Status: DC | PRN

## 2011-06-21 NOTE — H&P (Signed)
PCP:  No primary provider on file.   DOA:  06/21/2011  4:26 PM  Chief Complaint:  Left flank pain, nausea and vomiting  HPI: 30 yo female with no significant PMH presents with progressively worsening left flank pain that initially started 3-4 days prior to admission, associated with nausea, vomiting, poor oral intake, subjective fevers and chills. Pt reports going to urgent care and was told she has viral gastroenteritis. She comes in today for further evaluation since her symptoms have not improved. She denies any urinary concerns. She describes her pain as sharp, intermittent, 10/10 in severity, with no specific aggravating or alleviating factors. Denies similar episodes in the past, denies history of STD's. Not currently menstruating.   Allergies: Allergies  Allergen Reactions  . Morphine And Related Nausea And Vomiting  . Phenergan     Prior to Admission medications   Not on File    Past Medical History  Diagnosis Date  . Seizures   . Pyelonephritis 06/21/2011    No past surgical history on file.  Social History:  reports that she has never smoked. She has never used smokeless tobacco. She reports that she does not drink alcohol or use illicit drugs.  No family history on file.  Review of Systems:  Constitutional: Denies diaphoresis, fatigue.  HEENT: Denies photophobia, eye pain, redness, hearing loss, ear pain, congestion, sore throat, rhinorrhea, sneezing, mouth sores, trouble swallowing, neck pain, neck stiffness and tinnitus.   Respiratory: Denies SOB, DOE, cough, chest tightness,  and wheezing.   Cardiovascular: Denies chest pain, palpitations and leg swelling.  Gastrointestinal: Denies abdominal pain, diarrhea, constipation, blood in stool and abdominal distention.  Genitourinary: Denies dysuria, urgency, frequency, hematuria, and difficulty urinating.  Musculoskeletal: Denies myalgias, back pain, joint swelling, arthralgias and gait problem.  Skin: Denies pallor,  rash and wound.  Neurological: Denies dizziness, seizures, syncope, weakness, light-headedness, numbness and headaches.  Hematological: Denies adenopathy. Easy bruising, personal or family bleeding history  Psychiatric/Behavioral: Denies suicidal ideation, mood changes, confusion, nervousness, sleep disturbance and agitation   Physical Exam:  Filed Vitals:   06/21/11 1700  BP: 126/76  Pulse: 85  Temp: 98.7 F (37.1 C)  TempSrc: Oral  Resp: 18  SpO2: 99%    Constitutional: Vital signs reviewed.  Patient is a well-developed and well-nourished in no acute distress and cooperative with exam. Alert and oriented x3.  Head: Normocephalic and atraumatic Ear: TM normal bilaterally Mouth: no erythema or exudates, MMM Eyes: PERRL, EOMI, conjunctivae normal, No scleral icterus.  Neck: Supple, Trachea midline normal ROM, No JVD, mass, thyromegaly, or carotid bruit present.  Cardiovascular: RRR, S1 normal, S2 normal, no MRG, pulses symmetric and intact bilaterally Pulmonary/Chest: CTAB, no wheezes, rales, or rhonchi Abdominal: Soft. Non-tender, non-distended, bowel sounds are normal, no masses, organomegaly, or guarding present.  GU: significant left CVA tenderness Musculoskeletal: No joint deformities, erythema, or stiffness, ROM full and no nontender Ext: no edema and no cyanosis, pulses palpable bilaterally (DP and PT) Hematology: no cervical, inginal, or axillary adenopathy.  Neurological: A&O x3, Strenght is normal and symmetric bilaterally, cranial nerve II-XII are grossly intact, no focal motor deficit, sensory intact to light touch bilaterally.  Skin: Warm, dry and intact. No rash, cyanosis, or clubbing.  Psychiatric: Normal mood and affect. speech and behavior is normal. Judgment and thought content normal. Cognition and memory are normal.   Labs on Admission:  No results found for this or any previous visit (from the past 48 hour(s)).  Radiological Exams on Admission: No results  found.  Assessment/Plan Principal Problem:  *Pyelonephritis - symptoms certainly worrisome for pyelonephritis - will obtain imaging studies for further evaluation - provide supportive care with IVF, antibiotics, anti - emetics, and anti - analgesics - keep NPO for now  Time Spent on Admission: Over 30 minutes  MAGICK-Anyia Gierke 06/21/2011, 5:07 PM

## 2011-06-22 LAB — CBC
HCT: 34.4 % — ABNORMAL LOW (ref 36.0–46.0)
Hemoglobin: 11.6 g/dL — ABNORMAL LOW (ref 12.0–15.0)
MCH: 29.4 pg (ref 26.0–34.0)
MCHC: 33.7 g/dL (ref 30.0–36.0)
MCV: 87.3 fL (ref 78.0–100.0)
Platelets: 214 10*3/uL (ref 150–400)
RBC: 3.94 MIL/uL (ref 3.87–5.11)
RDW: 11.9 % (ref 11.5–15.5)
WBC: 6.4 10*3/uL (ref 4.0–10.5)

## 2011-06-22 LAB — BASIC METABOLIC PANEL WITH GFR
BUN: 8 mg/dL (ref 6–23)
CO2: 25 meq/L (ref 19–32)
Calcium: 8.5 mg/dL (ref 8.4–10.5)
Chloride: 105 meq/L (ref 96–112)
Creatinine, Ser: 0.65 mg/dL (ref 0.50–1.10)
GFR calc Af Amer: >90 mL/min
GFR calc non Af Amer: >90 mL/min
Glucose, Bld: 83 mg/dL (ref 70–99)
Potassium: 3.6 meq/L (ref 3.5–5.1)
Sodium: 138 meq/L (ref 135–145)

## 2011-06-22 LAB — MRSA PCR SCREENING: MRSA by PCR: POSITIVE — AB

## 2011-06-22 MED ORDER — MUPIROCIN 2 % EX OINT
1.0000 | TOPICAL_OINTMENT | Freq: Two times a day (BID) | CUTANEOUS | Status: DC
Start: 2011-06-22 — End: 2011-06-23
  Administered 2011-06-22: 1 via NASAL
  Filled 2011-06-22 (×2): qty 22

## 2011-06-22 MED ORDER — CHLORHEXIDINE GLUCONATE CLOTH 2 % EX PADS: 6.0000 | MEDICATED_PAD | Freq: Every day | CUTANEOUS | Status: DC

## 2011-06-22 NOTE — Progress Notes (Signed)
Patient ID: Meredith [name], female   DOB: May 18, 1981, 30 y.o.   MRN: [id number]  Subjective: No events overnight. Patient denies chest pain, shortness of breath, abdominal pain.   Objective:  Vital signs in last 24 hours:  Filed Vitals:   06/21/11 1700 06/21/11 1757 06/21/11 2300 06/22/11 0430  BP: 126/76 102/66 100/63 96/60  Pulse: 85 80 81 88  Temp: 98.7 F (37.1 C) 98.5 F (36.9 C) 98.5 F (36.9 C) 98.7 F (37.1 C)  TempSrc: Oral Oral Oral Oral  Resp: 18 18 18 18   Height: 5\' 2"  (1.575 m)     Weight: 68.765 kg (151 lb 9.6 oz)     SpO2: 99% 98% 99% 96%    Intake/Output from previous day:   Intake/Output Summary (Last 24 hours) at 06/22/11 0839 Last data filed at 06/22/11 0600  Gross per 24 hour  Intake 763.33 ml  Output    800 ml  Net -36.67 ml    Physical Exam: General: Alert, awake, oriented x3, in no acute distress. HEENT: No bruits, no goiter. Moist mucous membranes, no scleral icterus, no conjunctival pallor. Heart: Regular rate and rhythm, without murmurs, rubs, gallops. Lungs: Clear to auscultation bilaterally. No wheezing, no rhonchi, no rales.  Abdomen: Soft, nontender, nondistended, positive bowel sounds. Extremities: No clubbing cyanosis or edema,  positive pedal pulses. Neuro: Grossly intact, nonfocal.  Lab Results:  Basic Metabolic Panel:    Component Value Date/Time   NA 138 06/22/2011 0420   K 3.6 06/22/2011 0420   CL 105 06/22/2011 0420   CO2 25 06/22/2011 0420   BUN 8 06/22/2011 0420   CREATININE 0.65 06/22/2011 0420   GLUCOSE 83 06/22/2011 0420   CALCIUM 8.5 06/22/2011 0420   CBC:    Component Value Date/Time   WBC 6.4 06/22/2011 0420   HGB 11.6* 06/22/2011 0420   HCT 34.4* 06/22/2011 0420   PLT 214 06/22/2011 0420   MCV 87.3 06/22/2011 0420   NEUTROABS 4.1 06/21/2011 1745   LYMPHSABS 2.2 06/21/2011 1745   MONOABS 0.9 06/21/2011 1745   EOSABS 0.1 06/21/2011 1745   BASOSABS 0.0 06/21/2011 1745      Lab 06/22/11 0420 06/21/11 1745   WBC 6.4 7.3  HGB 11.6* 11.4*  HCT 34.4* 33.3*  PLT 214 218  MCV 87.3 87.4  MCH 29.4 29.9  MCHC 33.7 34.2  RDW 11.9 12.1  LYMPHSABS -- 2.2  MONOABS -- 0.9  EOSABS -- 0.1  BASOSABS -- 0.0  BANDABS -- --    Lab 06/22/11 0420 06/21/11 1745  NA 138 141  K 3.6 3.7  CL 105 108  CO2 25 26  GLUCOSE 83 82  BUN 8 9  CREATININE 0.65 0.69  CALCIUM 8.5 8.4  MG -- 1.9    Studies/Results:  Ct Abdomen Pelvis W Contrast 06/21/2011   IMPRESSION: Large nonobstructing stone in the left kidney parenchyma.  No acute inflammatory process or abscess demonstrated in the abdomen or pelvis.   US Renal 06/21/2011     Impression:  10 mm left renal stone without evidence for hydronephrosis.  Diffuse cortical thinning in the right kidney.   Medications: Scheduled Meds:   . cefTRIAXone (ROCEPHIN)  IV  1 g Intravenous Q24H  . enoxaparin  40 mg Subcutaneous Q24H  . zolpidem  5 mg Oral QHS   Continuous Infusions:   . sodium chloride 100 mL/hr at 06/22/11 0310   PRN Meds:.acetaminophen, acetaminophen, HYDROmorphone, iohexol, ondansetron (ZOFRAN) IV, ondansetron, oxyCODONE, polyethylene glycol  Assessment/Plan:  Principal Problem:  *  Pyelonephritis  - please note the above's CT abd/pelvis findings - continue supportive care with antibiotics for now - called urologist on call, recommendation is to treat for pyelo with follow up with urology in next 1-2 weeks once pt discharged   LOS: 1 day   MAGICK-Vinette Crites 06/22/2011, 8:39 AM

## 2011-06-23 LAB — URINE CULTURE: Culture  Setup Time: 201212092056

## 2011-06-23 MED ORDER — ONDANSETRON HCL 4 MG PO TABS: 4.0000 mg | ORAL_TABLET | Freq: Four times a day (QID) | ORAL | Status: AC | PRN

## 2011-06-23 MED ORDER — OXYCODONE HCL 5 MG PO TABS: 5.0000 mg | ORAL_TABLET | ORAL | Status: AC | PRN

## 2011-06-23 MED ORDER — ZOLPIDEM TARTRATE 5 MG PO TABS: 5.0000 mg | ORAL_TABLET | Freq: Every day | ORAL | Status: DC

## 2011-06-23 MED ORDER — MUPIROCIN 2 % EX OINT: 1.0000 "application " | TOPICAL_OINTMENT | Freq: Two times a day (BID) | CUTANEOUS | Status: AC

## 2011-06-23 MED ORDER — SULFAMETHOXAZOLE-TRIMETHOPRIM 800-160 MG PO TABS: 1.0000 | ORAL_TABLET | Freq: Two times a day (BID) | ORAL | Status: AC

## 2011-06-23 NOTE — Discharge Summary (Signed)
Patient ID: Meredith [name] MRN: [id number] DOB/AGE: [birth year] 30 y.o.  Admit date: 06/21/2011 Discharge date: 06/23/2011  Primary Care Physician:  No primary provider on file.  Discharge Diagnoses:    Present on Admission:  .Pyelonephritis  Principal Problem:  *Pyelonephritis   Current Discharge Medication List    START taking these medications   Details  mupirocin ointment (BACTROBAN) 2 % Apply 1 application topically 2 (two) times daily. Qty: 22 g, Refills: 1    ondansetron (ZOFRAN) 4 MG tablet Take 1 tablet (4 mg total) by mouth every 6 (six) hours as needed for nausea. Qty: 60 tablet, Refills: 1    oxyCODONE (OXY IR/ROXICODONE) 5 MG immediate release tablet Take 1 tablet (5 mg total) by mouth every 4 (four) hours as needed. Qty: 60 tablet, Refills: 0    sulfamethoxazole-trimethoprim (BACTRIM DS) 800-160 MG per tablet Take 1 tablet by mouth 2 (two) times daily. Qty: 28 tablet, Refills: 0    zolpidem (AMBIEN) 5 MG tablet Take 1 tablet (5 mg total) by mouth at bedtime. Qty: 30 tablet, Refills: 0        Disposition and Follow-up: Pt was discharged from the hospital in stable condition and will go home on antibiotics to complete therapy for 14 days. She was advised to make an appointment with [facility] and the phone number was provided, she was told to make an appointment in 1-2 weeks for further evaluation and management.  Consults: none, urology on call called to review the case and provide recommendations  Significant Diagnostic Studies:   Ct Abdomen Pelvis W Contrast 06/21/2011  IMPRESSION: Large nonobstructing stone in the left kidney parenchyma.  No acute inflammatory process or abscess demonstrated in the abdomen or pelvis.    US Renal 06/21/2011  Impression:  10 mm left renal stone without evidence for hydronephrosis.  Diffuse cortical thinning in the right kidney.    Brief H and P: 30 yo female with no significant PMH presents with  progressively worsening left flank pain that initially started 3-4 days prior to admission, associated with nausea, vomiting, poor oral intake, subjective fevers and chills. Pt reports going to urgent care and was told she has viral gastroenteritis. She comes in today for further evaluation since her symptoms have not improved. She denies any urinary concerns. She describes her pain as sharp, intermittent, 10/10 in severity, with no specific aggravating or alleviating factors. Denies similar episodes in the past, denies history of STD's. Not currently menstruating.    Physical Exam on Discharge:  Filed Vitals:   06/22/11 0430 06/22/11 1400 06/22/11 2209 06/23/11 0500  BP: 96/60 88/58 95/65  93/65  Pulse: 88 90 84 74  Temp: 98.7 F (37.1 C) 98.3 F (36.8 C) 98.3 F (36.8 C) 98.3 F (36.8 C)  TempSrc: Oral Oral Oral Oral  Resp: 18 18 17 18   Height:      Weight:      SpO2: 96% 96% 98% 97%     Intake/Output Summary (Last 24 hours) at 06/23/11 0744 Last data filed at 06/22/11 2030  Gross per 24 hour  Intake    240 ml  Output    400 ml  Net   -160 ml    General: Alert, awake, oriented x3, in no acute distress. HEENT: No bruits, no goiter. Heart: Regular rate and rhythm, without murmurs, rubs, gallops. Lungs: Clear to auscultation bilaterally. Abdomen: Soft, nontender, nondistended, positive bowel sounds. Extremities: No clubbing cyanosis or edema with positive pedal pulses. Neuro: Grossly intact, nonfocal.  CBC:    Component Value Date/Time   WBC 6.4 06/22/2011 0420   HGB 11.6* 06/22/2011 0420   HCT 34.4* 06/22/2011 0420   PLT 214 06/22/2011 0420   MCV 87.3 06/22/2011 0420   NEUTROABS 4.1 06/21/2011 1745   LYMPHSABS 2.2 06/21/2011 1745   MONOABS 0.9 06/21/2011 1745   EOSABS 0.1 06/21/2011 1745   BASOSABS 0.0 06/21/2011 1745    Basic Metabolic Panel:    Component Value Date/Time   NA 138 06/22/2011 0420   K 3.6 06/22/2011 0420   CL 105 06/22/2011 0420   CO2 25 06/22/2011  0420   BUN 8 06/22/2011 0420   CREATININE 0.65 06/22/2011 0420   GLUCOSE 83 06/22/2011 0420   CALCIUM 8.5 06/22/2011 0420    Hospital Course:  Principal Problem:  *Pyelonephritis - pt clinically improving and tolerating PO - she is stable for discharge and will need to follow up with urologist once discharged - she will continue to take antibiotic for 14 additional days   Disposition - plan of care and diagnosis, diagnostic studies and test results were discussed with pt and family at bedside - pt and  family verbalized understanding  Time spent on Discharge: Over 30 minutes  Signed: MAGICK-Barclay Lennox 06/23/2011, 7:44 AM

## 2011-06-23 NOTE — Progress Notes (Signed)
Pt being d/c to the home. Pt and pt's husband verbalize understanding of all d/c instructions and state they have no further questions. Pt taken to car via wheelchair. Marcelino Duster, RN

## 2011-07-03 ENCOUNTER — Other Ambulatory Visit: Payer: Self-pay | Admitting: Urology

## 2011-07-09 ENCOUNTER — Other Ambulatory Visit: Payer: Self-pay | Admitting: Urology

## 2011-07-09 DIAGNOSIS — N200 Calculus of kidney: Secondary | ICD-10-CM

## 2011-07-20 ENCOUNTER — Encounter (HOSPITAL_COMMUNITY): Payer: Self-pay | Admitting: Pharmacy Technician

## 2011-07-27 ENCOUNTER — Encounter (HOSPITAL_COMMUNITY)
Admission: RE | Admit: 2011-07-27 | Discharge: 2011-07-27 | Disposition: A | Discharge status: Home / Self Care | Payer: BC Managed Care – PPO | Source: Ambulatory Visit | Attending: Urology | Admitting: Urology

## 2011-07-27 ENCOUNTER — Encounter (HOSPITAL_COMMUNITY): Payer: Self-pay

## 2011-07-27 ENCOUNTER — Other Ambulatory Visit: Payer: Self-pay | Admitting: Radiology

## 2011-07-27 LAB — SURGICAL PCR SCREEN: Staphylococcus aureus: NEGATIVE

## 2011-07-27 LAB — BASIC METABOLIC PANEL
BUN: 11 mg/dL (ref 6–23)
CO2: 24 mEq/L (ref 19–32)
GFR calc non Af Amer: >90 mL/min (ref >90)
Glucose, Bld: 102 mg/dL — ABNORMAL HIGH (ref 70–99)
Potassium: 3.5 mEq/L (ref 3.5–5.1)
Sodium: 140 mEq/L (ref 135–145)

## 2011-07-27 LAB — CBC
HCT: 39.9 % (ref 36.0–46.0)
Hemoglobin: 13.5 g/dL (ref 12.0–15.0)
MCHC: 33.8 g/dL (ref 30.0–36.0)
RBC: 4.56 MIL/uL (ref 3.87–5.11)

## 2011-07-27 LAB — HCG, SERUM, QUALITATIVE: Preg, Serum: NEGATIVE

## 2011-07-27 LAB — APTT: aPTT: 34 seconds (ref 24–37)

## 2011-07-27 LAB — PROTIME-INR: INR: 0.97 (ref 0.00–1.49)

## 2011-07-27 NOTE — Patient Instructions (Signed)
20 Meredith [name]  07/27/2011   Your procedure is scheduled on:  08-03-2011  Report to [facility] long radiology at 0715 AM.  Call this number if you have problems the morning of surgery: [phone]   Remember:   Do not eat food or drink liquid:After Midnight.      Take these medicines the morning of surgery with A SIP OF WATER:no meds to take   Do not wear jewelry, make-up  Do not wear lotions, powders, or perfumes.     Do not bring valuables to the hospital.  Contacts, dentures or bridgework may not be worn into surgery.  Leave suitcase in the car. After surgery it may be brought to your room.  For patients admitted to the hospital, checkout time is 11:00 AM the day of discharge.     Special Instructions: CHG Shower Use Special Wash: 1/2 bottle night before surgery and 1/2 bottle morning of surgery.neck down avoid private area   Please read over the following fact sheets that you were given: MRSA Information Cain Sieve, rn [facility] pre op nurse phone number [phone]

## 2011-07-28 ENCOUNTER — Other Ambulatory Visit: Payer: Self-pay | Admitting: Urology

## 2011-07-28 ENCOUNTER — Inpatient Hospital Stay (HOSPITAL_COMMUNITY): Admission: RE | Admit: 2011-07-28 | Payer: BC Managed Care – PPO | Source: Ambulatory Visit

## 2011-07-29 MED ORDER — ACETAMINOPHEN 10 MG/ML IV SOLN: 1000.0000 mg | Freq: Four times a day (QID) | INTRAVENOUS | Status: AC

## 2011-07-30 ENCOUNTER — Other Ambulatory Visit: Payer: Self-pay | Admitting: Radiology

## 2011-08-03 ENCOUNTER — Ambulatory Visit (HOSPITAL_COMMUNITY)
Admission: RE | Admit: 2011-08-03 | Discharge: 2011-08-03 | Disposition: A | Discharge status: Home / Self Care | Payer: BC Managed Care – PPO | Source: Ambulatory Visit | Attending: Urology | Admitting: Urology

## 2011-08-03 ENCOUNTER — Ambulatory Visit (HOSPITAL_COMMUNITY): Payer: BC Managed Care – PPO

## 2011-08-03 ENCOUNTER — Encounter (HOSPITAL_COMMUNITY)
Admission: RE | Disposition: A | Discharge status: Home / Self Care | Payer: Self-pay | Source: Ambulatory Visit | Attending: Urology

## 2011-08-03 ENCOUNTER — Encounter (HOSPITAL_COMMUNITY): Payer: Self-pay

## 2011-08-03 ENCOUNTER — Inpatient Hospital Stay (HOSPITAL_COMMUNITY)
Admission: RE | Admit: 2011-08-03 | Discharge: 2011-08-06 | DRG: 304 | Disposition: A | Discharge status: Home / Self Care | Payer: BC Managed Care – PPO | Source: Ambulatory Visit | Attending: Urology | Admitting: Urology

## 2011-08-03 ENCOUNTER — Ambulatory Visit (HOSPITAL_COMMUNITY): Payer: BC Managed Care – PPO | Admitting: Anesthesiology

## 2011-08-03 ENCOUNTER — Encounter (HOSPITAL_COMMUNITY): Payer: Self-pay | Admitting: Anesthesiology

## 2011-08-03 DIAGNOSIS — N200 Calculus of kidney: Secondary | ICD-10-CM

## 2011-08-03 DIAGNOSIS — R319 Hematuria, unspecified: Secondary | ICD-10-CM | POA: Diagnosis present

## 2011-08-03 DIAGNOSIS — Z01812 Encounter for preprocedural laboratory examination: Secondary | ICD-10-CM

## 2011-08-03 DIAGNOSIS — E876 Hypokalemia: Secondary | ICD-10-CM | POA: Diagnosis present

## 2011-08-03 DIAGNOSIS — R112 Nausea with vomiting, unspecified: Secondary | ICD-10-CM | POA: Diagnosis not present

## 2011-08-03 DIAGNOSIS — R1115 Cyclical vomiting syndrome unrelated to migraine: Secondary | ICD-10-CM | POA: Diagnosis not present

## 2011-08-03 DIAGNOSIS — R5381 Other malaise: Secondary | ICD-10-CM | POA: Diagnosis present

## 2011-08-03 DIAGNOSIS — R109 Unspecified abdominal pain: Secondary | ICD-10-CM | POA: Diagnosis present

## 2011-08-03 DIAGNOSIS — N12 Tubulo-interstitial nephritis, not specified as acute or chronic: Secondary | ICD-10-CM

## 2011-08-03 HISTORY — PX: NEPHROSTOMY: SHX1014

## 2011-08-03 HISTORY — PX: NEPHROLITHOTOMY: SHX5134

## 2011-08-03 SURGERY — NEPHROLITHOTOMY PERCUTANEOUS
Anesthesia: General | Laterality: Left | Wound class: Clean

## 2011-08-03 MED ORDER — FENTANYL CITRATE 0.05 MG/ML IJ SOLN
INTRAMUSCULAR | Status: AC
  Filled 2011-08-03: qty 2

## 2011-08-03 MED ORDER — FENTANYL CITRATE 0.05 MG/ML IJ SOLN: 25.0000 ug | INTRAMUSCULAR | Status: DC | PRN

## 2011-08-03 MED ORDER — CISATRACURIUM BESYLATE 2 MG/ML IV SOLN
INTRAVENOUS | Status: DC | PRN
  Administered 2011-08-03: 5 mg via INTRAVENOUS

## 2011-08-03 MED ORDER — CIPROFLOXACIN IN D5W 400 MG/200ML IV SOLN
400.0000 mg | INTRAVENOUS | Status: AC
  Administered 2011-08-03: 400 mg via INTRAVENOUS
  Filled 2011-08-03: qty 200

## 2011-08-03 MED ORDER — NEOSTIGMINE METHYLSULFATE 1 MG/ML IJ SOLN
INTRAMUSCULAR | Status: DC | PRN
  Administered 2011-08-03: 3 mg via INTRAVENOUS

## 2011-08-03 MED ORDER — OXYBUTYNIN CHLORIDE 5 MG PO TABS
5.0000 mg | ORAL_TABLET | Freq: Three times a day (TID) | ORAL | Status: DC | PRN
  Filled 2011-08-03: qty 1

## 2011-08-03 MED ORDER — DEXAMETHASONE SODIUM PHOSPHATE 10 MG/ML IJ SOLN
INTRAMUSCULAR | Status: DC | PRN
  Administered 2011-08-03 (×2): 10 mg via INTRAVENOUS

## 2011-08-03 MED ORDER — ONDANSETRON HCL 4 MG/2ML IJ SOLN
INTRAMUSCULAR | Status: AC
  Administered 2011-08-03: 4 mg via INTRAVENOUS
  Filled 2011-08-03: qty 2

## 2011-08-03 MED ORDER — CIPROFLOXACIN IN D5W 400 MG/200ML IV SOLN: 400.0000 mg | Freq: Once | INTRAVENOUS | Status: DC

## 2011-08-03 MED ORDER — SODIUM CHLORIDE 0.9 % IR SOLN
Status: DC | PRN
  Administered 2011-08-03: 9000 mL

## 2011-08-03 MED ORDER — ONDANSETRON HCL 4 MG/2ML IJ SOLN
4.0000 mg | INTRAMUSCULAR | Status: DC | PRN
  Administered 2011-08-03 – 2011-08-05 (×10): 4 mg via INTRAVENOUS
  Filled 2011-08-03 (×11): qty 2

## 2011-08-03 MED ORDER — GLYCOPYRROLATE 0.2 MG/ML IJ SOLN
INTRAMUSCULAR | Status: DC | PRN
  Administered 2011-08-03: .4 mg via INTRAVENOUS

## 2011-08-03 MED ORDER — LIDOCAINE HCL 1 % IJ SOLN
INTRAMUSCULAR | Status: AC
  Filled 2011-08-03: qty 20

## 2011-08-03 MED ORDER — SUCCINYLCHOLINE CHLORIDE 20 MG/ML IJ SOLN
INTRAMUSCULAR | Status: DC | PRN
  Administered 2011-08-03: 100 mg via INTRAVENOUS

## 2011-08-03 MED ORDER — ZOLPIDEM TARTRATE 5 MG PO TABS: 5.0000 mg | ORAL_TABLET | Freq: Every evening | ORAL | Status: DC | PRN

## 2011-08-03 MED ORDER — SODIUM CHLORIDE 0.9 % IV SOLN: INTRAVENOUS | Status: DC

## 2011-08-03 MED ORDER — IOHEXOL 300 MG/ML  SOLN
INTRAMUSCULAR | Status: AC
  Filled 2011-08-03: qty 2

## 2011-08-03 MED ORDER — ACETAMINOPHEN 10 MG/ML IV SOLN
INTRAVENOUS | Status: AC
  Filled 2011-08-03: qty 100

## 2011-08-03 MED ORDER — SULFAMETHOXAZOLE-TMP DS 800-160 MG PO TABS
1.0000 | ORAL_TABLET | Freq: Two times a day (BID) | ORAL | Status: DC
  Administered 2011-08-04 (×2): 1 via ORAL
  Filled 2011-08-03 (×6): qty 1

## 2011-08-03 MED ORDER — SULFAMETHOXAZOLE-TMP DS 800-160 MG PO TABS: 0.5000 | ORAL_TABLET | Freq: Every day | ORAL | Status: DC

## 2011-08-03 MED ORDER — LACTATED RINGERS IV SOLN: INTRAVENOUS | Status: DC

## 2011-08-03 MED ORDER — PROPOFOL 10 MG/ML IV EMUL
INTRAVENOUS | Status: DC | PRN
  Administered 2011-08-03: 175 mg via INTRAVENOUS

## 2011-08-03 MED ORDER — ONDANSETRON HCL 4 MG/2ML IJ SOLN
4.0000 mg | Freq: Once | INTRAMUSCULAR | Status: AC
  Administered 2011-08-03: 4 mg via INTRAVENOUS

## 2011-08-03 MED ORDER — SULFAMETHOXAZOLE-TRIMETHOPRIM 400-80 MG PO TABS
1.0000 | ORAL_TABLET | Freq: Two times a day (BID) | ORAL | Status: DC
  Filled 2011-08-03: qty 1

## 2011-08-03 MED ORDER — LACTATED RINGERS IV SOLN
INTRAVENOUS | Status: DC
  Administered 2011-08-03: 10:00:00 via INTRAVENOUS

## 2011-08-03 MED ORDER — DROPERIDOL 2.5 MG/ML IJ SOLN
INTRAMUSCULAR | Status: DC | PRN
  Administered 2011-08-03: 0.625 mg via INTRAVENOUS

## 2011-08-03 MED ORDER — DEXTROSE-NACL 5-0.45 % IV SOLN
INTRAVENOUS | Status: DC
  Administered 2011-08-03 – 2011-08-04 (×4): via INTRAVENOUS
  Administered 2011-08-05: 125 mL/h via INTRAVENOUS
  Administered 2011-08-05: 02:00:00 via INTRAVENOUS

## 2011-08-03 MED ORDER — KETOROLAC TROMETHAMINE 30 MG/ML IJ SOLN
INTRAMUSCULAR | Status: DC | PRN
  Administered 2011-08-03: 30 mg via INTRAVENOUS

## 2011-08-03 MED ORDER — FENTANYL CITRATE 0.05 MG/ML IJ SOLN
50.0000 ug | INTRAMUSCULAR | Status: DC | PRN
  Administered 2011-08-03: 100 ug via INTRAVENOUS

## 2011-08-03 MED ORDER — ONDANSETRON HCL 4 MG/2ML IJ SOLN
4.0000 mg | Freq: Once | INTRAMUSCULAR | Status: AC | PRN
  Administered 2011-08-03: 4 mg via INTRAVENOUS
  Filled 2011-08-03: qty 2

## 2011-08-03 MED ORDER — IBUPROFEN 800 MG PO TABS
800.0000 mg | ORAL_TABLET | Freq: Three times a day (TID) | ORAL | Status: DC | PRN
  Administered 2011-08-04: 800 mg via ORAL
  Filled 2011-08-03 (×3): qty 1

## 2011-08-03 MED ORDER — SENNOSIDES-DOCUSATE SODIUM 8.6-50 MG PO TABS
2.0000 | ORAL_TABLET | Freq: Every day | ORAL | Status: DC
  Filled 2011-08-03 (×4): qty 2

## 2011-08-03 MED ORDER — LACTATED RINGERS IV SOLN
INTRAVENOUS | Status: DC | PRN
  Administered 2011-08-03: 11:00:00 via INTRAVENOUS

## 2011-08-03 MED ORDER — CEFAZOLIN SODIUM 1-5 GM-% IV SOLN
INTRAVENOUS | Status: AC
  Filled 2011-08-03: qty 50

## 2011-08-03 MED ORDER — KETOROLAC TROMETHAMINE 30 MG/ML IJ SOLN
30.0000 mg | Freq: Four times a day (QID) | INTRAMUSCULAR | Status: AC
  Administered 2011-08-03 – 2011-08-04 (×4): 30 mg via INTRAVENOUS
  Filled 2011-08-03 (×5): qty 1

## 2011-08-03 MED ORDER — ACETAMINOPHEN 10 MG/ML IV SOLN
1000.0000 mg | Freq: Four times a day (QID) | INTRAVENOUS | Status: AC
  Administered 2011-08-03 – 2011-08-04 (×4): 1000 mg via INTRAVENOUS
  Filled 2011-08-03 (×4): qty 100

## 2011-08-03 MED ORDER — LIDOCAINE HCL (CARDIAC) 20 MG/ML IV SOLN
INTRAVENOUS | Status: DC | PRN
  Administered 2011-08-03: 20 mg via INTRAVENOUS

## 2011-08-03 MED ORDER — DIPHENHYDRAMINE HCL 12.5 MG/5ML PO ELIX: 12.5000 mg | ORAL_SOLUTION | Freq: Four times a day (QID) | ORAL | Status: DC | PRN

## 2011-08-03 MED ORDER — CEFAZOLIN SODIUM 1-5 GM-% IV SOLN
1.0000 g | INTRAVENOUS | Status: AC
  Administered 2011-08-03: 1 g via INTRAVENOUS

## 2011-08-03 MED ORDER — BUPIVACAINE LIPOSOME 1.3 % IJ SUSP
20.0000 mL | INTRAMUSCULAR | Status: AC
  Administered 2011-08-03: 10 mL
  Filled 2011-08-03: qty 20

## 2011-08-03 MED ORDER — SODIUM CHLORIDE 0.9 % IR SOLN
Status: DC | PRN
  Administered 2011-08-03: 1000 mL

## 2011-08-03 MED ORDER — FENTANYL CITRATE 0.05 MG/ML IJ SOLN
INTRAMUSCULAR | Status: AC | PRN
  Administered 2011-08-03: 50 ug via INTRAVENOUS
  Administered 2011-08-03: 100 ug via INTRAVENOUS
  Administered 2011-08-03: 50 ug via INTRAVENOUS

## 2011-08-03 MED ORDER — FENTANYL CITRATE 0.05 MG/ML IJ SOLN
INTRAMUSCULAR | Status: DC | PRN
  Administered 2011-08-03: 100 ug via INTRAVENOUS
  Administered 2011-08-03 (×3): 50 ug via INTRAVENOUS

## 2011-08-03 MED ORDER — DIPHENHYDRAMINE HCL 50 MG/ML IJ SOLN: 12.5000 mg | Freq: Four times a day (QID) | INTRAMUSCULAR | Status: DC | PRN

## 2011-08-03 MED ORDER — MIDAZOLAM HCL 5 MG/5ML IJ SOLN
INTRAMUSCULAR | Status: DC | PRN
  Administered 2011-08-03: 1 mg via INTRAVENOUS

## 2011-08-03 MED ORDER — IOHEXOL 300 MG/ML  SOLN
INTRAMUSCULAR | Status: DC | PRN
  Administered 2011-08-03: 3 mL

## 2011-08-03 MED ORDER — KETAMINE HCL 10 MG/ML IJ SOLN
INTRAMUSCULAR | Status: DC | PRN
  Administered 2011-08-03: 10 mg via INTRAVENOUS

## 2011-08-03 MED ORDER — ONDANSETRON HCL 4 MG/2ML IJ SOLN
INTRAMUSCULAR | Status: DC | PRN
  Administered 2011-08-03 (×2): 2 mg via INTRAVENOUS

## 2011-08-03 MED ORDER — SODIUM CHLORIDE 0.9 % IV SOLN
INTRAVENOUS | Status: DC
  Administered 2011-08-03: 08:00:00 via INTRAVENOUS

## 2011-08-03 MED ORDER — LACTATED RINGERS IV SOLN
INTRAVENOUS | Status: DC
  Administered 2011-08-03: 14:00:00 via INTRAVENOUS

## 2011-08-03 MED ORDER — HYDROMORPHONE HCL PF 1 MG/ML IJ SOLN
0.5000 mg | INTRAMUSCULAR | Status: DC | PRN
  Administered 2011-08-03 – 2011-08-04 (×4): 1 mg via INTRAVENOUS
  Filled 2011-08-03 (×4): qty 1

## 2011-08-03 MED ORDER — SODIUM CHLORIDE 0.9 % IV SOLN
INTRAVENOUS | Status: DC | PRN
  Administered 2011-08-03: 13:00:00 via INTRAVENOUS

## 2011-08-03 MED ORDER — ACETAMINOPHEN 10 MG/ML IV SOLN
INTRAVENOUS | Status: DC | PRN
  Administered 2011-08-03: 1000 mg via INTRAVENOUS

## 2011-08-03 MED ORDER — MEPERIDINE HCL 100 MG/ML IJ SOLN: 50.0000 mg | INTRAMUSCULAR | Status: DC | PRN

## 2011-08-03 MED ORDER — MIDAZOLAM HCL 5 MG/5ML IJ SOLN
INTRAMUSCULAR | Status: AC | PRN
  Administered 2011-08-03: 1 mg via INTRAVENOUS
  Administered 2011-08-03: 2 mg via INTRAVENOUS

## 2011-08-03 MED ORDER — ACETAMINOPHEN 325 MG PO TABS
650.0000 mg | ORAL_TABLET | ORAL | Status: DC | PRN
  Administered 2011-08-05 – 2011-08-06 (×3): 650 mg via ORAL
  Filled 2011-08-03 (×3): qty 2

## 2011-08-03 SURGICAL SUPPLY — 45 items
APL SKNCLS STERI-STRIP NONHPOA (GAUZE/BANDAGES/DRESSINGS) ×1
BAG URINE DRAINAGE (UROLOGICAL SUPPLIES) ×2 IMPLANT
BASKET ZERO TIP NITINOL 2.4FR (BASKET) ×1 IMPLANT
BENZOIN TINCTURE PRP APPL 2/3 (GAUZE/BANDAGES/DRESSINGS) ×3 IMPLANT
BSKT STON RTRVL ZERO TP 2.4FR (BASKET)
CATCHER STONE W/TUBE ADAPTER (UROLOGICAL SUPPLIES) ×1 IMPLANT
CATH FOLEY 2W COUNCIL 20FR 5CC (CATHETERS) IMPLANT
CATH FOLEY 2W COUNCIL 5CC 18FR (CATHETERS) ×1 IMPLANT
CATH ROBINSON RED A/P 20FR (CATHETERS) IMPLANT
CATH X-FORCE N30 NEPHROSTOMY (TUBING) ×2 IMPLANT
CLOTH BEACON ORANGE TIMEOUT ST (SAFETY) ×2 IMPLANT
COVER SURGICAL LIGHT HANDLE (MISCELLANEOUS) ×2 IMPLANT
DRAPE C-ARM 42X72 X-RAY (DRAPES) ×2 IMPLANT
DRAPE CAMERA CLOSED 9X96 (DRAPES) ×2 IMPLANT
DRAPE LINGEMAN PERC (DRAPES) ×2 IMPLANT
DRAPE SURG IRRIG POUCH 19X23 (DRAPES) ×2 IMPLANT
DRSG TEGADERM 8X12 (GAUZE/BANDAGES/DRESSINGS) ×4 IMPLANT
GAUZE SPONGE 4X4 12PLY STRL LF (GAUZE/BANDAGES/DRESSINGS) ×1 IMPLANT
GLOVE BIOGEL M STRL SZ7.5 (GLOVE) ×2 IMPLANT
GOWN STRL REIN XL XLG (GOWN DISPOSABLE) ×2 IMPLANT
KIT BASIN OR (CUSTOM PROCEDURE TRAY) ×2 IMPLANT
LASER FIBER DISP (UROLOGICAL SUPPLIES) IMPLANT
LASER FIBER DISP 1000U (UROLOGICAL SUPPLIES) IMPLANT
MANIFOLD NEPTUNE II (INSTRUMENTS) ×2 IMPLANT
NS IRRIG 1000ML POUR BTL (IV SOLUTION) ×2 IMPLANT
PACK BASIC VI WITH GOWN DISP (CUSTOM PROCEDURE TRAY) ×3 IMPLANT
PAD ABD 7.5X8 STRL (GAUZE/BANDAGES/DRESSINGS) ×4 IMPLANT
POSITIONER SURGICAL ARM (MISCELLANEOUS) ×2 IMPLANT
PROBE EHL 9 FR 470CM (MISCELLANEOUS) IMPLANT
PROBE LITHOCLAST ULTRA 3.8X403 (UROLOGICAL SUPPLIES) ×1 IMPLANT
PROBE PNEUMATIC 1.0MMX570MM (UROLOGICAL SUPPLIES) ×2 IMPLANT
SET IRRIG Y TYPE TUR BLADDER L (SET/KITS/TRAYS/PACK) ×2 IMPLANT
SET WARMING FLUID IRRIGATION (MISCELLANEOUS) ×1 IMPLANT
SPONGE GAUZE 4X4 12PLY (GAUZE/BANDAGES/DRESSINGS) ×2 IMPLANT
SPONGE LAP 4X18 X RAY DECT (DISPOSABLE) ×2 IMPLANT
STENT CONTOUR 7FRX24 (STENTS) ×1 IMPLANT
STONE CATCHER W/TUBE ADAPTER (UROLOGICAL SUPPLIES) ×2 IMPLANT
SUT SILK 2 0 30  PSL (SUTURE) ×1
SUT SILK 2 0 30 PSL (SUTURE) ×1 IMPLANT
SYR 20CC LL (SYRINGE) ×3 IMPLANT
SYRINGE 10CC LL (SYRINGE) ×2 IMPLANT
TOWEL OR NON WOVEN STRL DISP B (DISPOSABLE) ×2 IMPLANT
TRAY FOLEY CATH 14FRSI W/METER (CATHETERS) ×2 IMPLANT
TUBING CONNECTING 10 (TUBING) ×5 IMPLANT
WATER STERILE IRR 1500ML POUR (IV SOLUTION) ×1 IMPLANT

## 2011-08-03 NOTE — Anesthesia Preprocedure Evaluation (Signed)
Anesthesia Evaluation  Patient identified by MRN, date of birth, ID band Patient awake    Reviewed: Allergy & Precautions, H&P , NPO status , Patient's Chart, lab work & pertinent test results  Airway Mallampati: II TM Distance: >3 FB Neck ROM: full    Dental No notable dental hx.    Pulmonary neg pulmonary ROS,  clear to auscultation  Pulmonary exam normal       Cardiovascular Exercise Tolerance: Good neg cardio ROS regular Normal    Neuro/Psych Negative Neurological ROS  Negative Psych ROS   GI/Hepatic negative GI ROS, Neg liver ROS,   Endo/Other  Negative Endocrine ROS  Renal/GU negative Renal ROS  Genitourinary negative   Musculoskeletal   Abdominal   Peds  Hematology negative hematology ROS (+)   Anesthesia Other Findings   Reproductive/Obstetrics negative OB ROS                           Anesthesia Physical Anesthesia Plan  ASA: I and Emergent  Anesthesia Plan: General and General ETT   Post-op Pain Management:    Induction:   Airway Management Planned:   Additional Equipment:   Intra-op Plan:   Post-operative Plan:   Informed Consent: I have reviewed the patients History and Physical, chart, labs and discussed the procedure including the risks, benefits and alternatives for the proposed anesthesia with the patient or authorized representative who has indicated his/her understanding and acceptance.   Dental Advisory Given  Plan Discussed with: CRNA  Anesthesia Plan Comments:         Anesthesia Quick Evaluation

## 2011-08-03 NOTE — Procedures (Signed)
L 70F PCN No comp

## 2011-08-03 NOTE — Transfer of Care (Signed)
Immediate Anesthesia Transfer of Care Note  Patient: Meredith [name]  Procedure(s) Performed:  NEPHROLITHOTOMY PERCUTANEOUS  Patient Location: PACU  Anesthesia Type: General  Level of Consciousness: awake and alert   Airway & Oxygen Therapy: Patient Spontanous Breathing and non-rebreather face mask  Post-op Assessment: Report given to PACU RN and Post -op Vital signs reviewed and stable  Post vital signs: Reviewed and stable  Complications: No apparent anesthesia complications

## 2011-08-03 NOTE — Anesthesia Postprocedure Evaluation (Signed)
Anesthesia Post Note  Patient: Meredith [name]  Procedure(s) Performed:  NEPHROLITHOTOMY PERCUTANEOUS  Anesthesia type: General  Patient location: PACU  Post pain: Pain level controlled  Post assessment: Post-op Vital signs reviewed  Last Vitals:  Filed Vitals:   08/03/11 1401  BP: 122/80  Pulse: 78  Temp: 36.1 C  Resp: 16    Post vital signs: Reviewed  Level of consciousness: sedated  Complications: No apparent anesthesia complications

## 2011-08-03 NOTE — H&P (Signed)
Meredith [name] is an 31 y.o. female.   Chief Complaint: History L flank pain HPI: L renal calculus, here for OR lithotomy  Past Medical History  Diagnosis Date  . Pyelonephritis 06/21/2011  . Seizures     at birth, none since    Past Surgical History  Procedure Date  . 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 Cesarean section jan 2009  . Wisdom tooth extraction 2006    No family history on file. Social History:  reports that she has never smoked. She has never used smokeless tobacco. She reports that she drinks alcohol. She reports that she does not use illicit drugs.  Allergies:  Allergies  Allergen Reactions  . Morphine And Related Nausea And Vomiting  . Phenergan Other (See Comments)    hypotention    Medications Prior to Admission  Medication Sig Dispense Refill  . ibuprofen (ADVIL,MOTRIN) 800 MG tablet Take 800 mg by mouth every 8 (eight) hours as needed. Pain        . sulfamethoxazole-trimethoprim (BACTRIM DS) 800-160 MG per tablet Take 0.5 tablets by mouth daily.       Marland Kitchen zolpidem (AMBIEN) 5 MG tablet Take 5 mg by mouth at bedtime as needed. Sleep        Medications Prior to Admission  Medication Dose Route Frequency Provider Last Rate Last Dose  . 0.9 %  sodium chloride infusion   Intravenous Continuous Robet Leu, PA      . 0.9 %  sodium chloride infusion   Intravenous Continuous D Jeananne Rama, PA      . acetaminophen (OFIRMEV) IV 1,000 mg  1,000 mg Intravenous Q6H       . ceFAZolin (ANCEF) IVPB 1 g/50 mL premix  1 g Intravenous 30 min Pre-Op       . ciprofloxacin (CIPRO) IVPB 400 mg  400 mg Intravenous Once Robet Leu, PA      . ciprofloxacin (CIPRO) IVPB 400 mg  400 mg Intravenous On Call D Jeananne Rama, PA        No results found for this or any previous visit (from the past 48 hour(s)). No results found.  Review of Systems  Constitutional: Negative for fever and chills.  Gastrointestinal: Negative for nausea and  vomiting.  Genitourinary: Negative for dysuria, urgency, frequency and flank pain.    Blood pressure 108/67, pulse 92, temperature 98.5 F (36.9 C), resp. rate 16, last menstrual period 07/02/2011, SpO2 99.00%. Physical Exam  Constitutional: She is oriented to person, place, and time. She appears well-developed and well-nourished.  Neck: Neck supple.  Cardiovascular: Normal rate, regular rhythm and normal heart sounds.   Respiratory: Effort normal and breath sounds normal. She has no wheezes. She has no rales.  GI: Soft. She exhibits no distension and no mass. There is no tenderness. There is no guarding.  Neurological: She is alert and oriented to person, place, and time.  Skin: Skin is warm and dry.     Assessment/Plan L PCN, then to OR.  [name],ART A 08/03/2011, 7:46 AM

## 2011-08-03 NOTE — Progress Notes (Signed)
EPIC will not allow me to Insert Intake of NS while here in SS. 100 cc NS infused -Total  volume left in bag from IR- LR begun for OR

## 2011-08-03 NOTE — H&P (Signed)
Urology Admission H&P  Chief Complaint: L flank pain, infection  History of Present Illness: 31 yo female with  Recent admission for pyelonephritis with L flank pain, N, v.  CT shows 10mm stone w/o hydronephrosis. She has diffuse cortical thinning fo the R kidney. She has been covered with Septra DS, with negative c/s. She is now for L perc nephrostomy.  Past Medical History  Diagnosis Date  . Pyelonephritis 06/21/2011  . Seizures     at birth, none since   Past Surgical History  Procedure Date  . 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 Cesarean section jan 2009  . Wisdom tooth extraction 2006  . Nephrostomy 08/03/11    left    Home Medications:  Prescriptions prior to admission  Medication Sig Dispense Refill  . ibuprofen (ADVIL,MOTRIN) 800 MG tablet Take 800 mg by mouth every 8 (eight) hours as needed. Pain        . sulfamethoxazole-trimethoprim (BACTRIM DS) 800-160 MG per tablet Take 0.5 tablets by mouth daily.       Marland Kitchen zolpidem (AMBIEN) 5 MG tablet Take 5 mg by mouth at bedtime as needed. Sleep        Allergies:  Allergies  Allergen Reactions  . Morphine And Related Nausea And Vomiting  . Phenergan Other (See Comments)    hypotention    History reviewed. No pertinent family history. Social History:  reports that she has never smoked. She has never used smokeless tobacco. She reports that she drinks alcohol. She reports that she does not use illicit drugs.  Review of Systems  Constitutional: Positive for malaise/fatigue. Negative for fever, chills, weight loss and diaphoresis.  HENT: Negative for hearing loss, ear pain and nosebleeds.   Eyes: Negative.   Respiratory: Negative.   Cardiovascular: Negative.   Gastrointestinal: Positive for nausea and vomiting. Negative for heartburn, diarrhea, constipation, blood in stool and melena.  Genitourinary: Positive for hematuria and flank pain. Negative for dysuria, urgency and frequency.    Musculoskeletal: Negative.   Skin: Negative for itching and rash.  Neurological: Positive for weakness. Negative for headaches.  Endo/Heme/Allergies: Negative.   Psychiatric/Behavioral: Negative.     Physical Exam:  Vital signs in last 24 hours: Temp:  [98.5 F (36.9 C)-98.7 F (37.1 C)] 98.7 F (37.1 C) (01/21 0933) Pulse Rate:  [78-98] 79  (01/21 1005) Resp:  [10-21] 18  (01/21 1005) BP: (108-123)/(64-80) 111/74 mmHg (01/21 1005) SpO2:  [98 %-100 %] 100 % (01/21 1005) Weight:  [70.308 kg (155 lb)] 70.308 kg (155 lb) (01/21 0817) Physical Exam  Constitutional: She is oriented to person, place, and time. She appears well-developed and well-nourished. She appears distressed.  HENT:  Head: Normocephalic.  Eyes: EOM are normal.  Neck: Normal range of motion.  Cardiovascular: Normal rate and intact distal pulses.   Respiratory: Effort normal. No respiratory distress. She has no wheezes. She has no rales. She exhibits no tenderness.  GI: Bowel sounds are normal. She exhibits no distension and no mass. There is no tenderness. There is no rebound and no guarding.  Genitourinary: Vagina normal.  Musculoskeletal: Normal range of motion.  Neurological: She is alert and oriented to person, place, and time.  Skin: Skin is warm and dry. She is not diaphoretic.  Psychiatric: She has a normal mood and affect.    Laboratory Data:  No results found for this or any previous visit (from the past 24 hour(s)). Recent Results (from the  past 240 hour(s))  SURGICAL PCR SCREEN     Status: Normal   Collection Time   07/27/11  1:05 PM      Component Value Range Status Comment   MRSA, PCR NEGATIVE  NEGATIVE  Final    Staphylococcus aureus NEGATIVE  NEGATIVE  Final    Creatinine:  Basename 07/27/11 1340  CREATININE 0.74    Impression/Assessment:  Left renal stone for percutaneous nephroliithotomy  Plan:  L perc nephrostomy. L side marked.  Shrita Thien I 08/03/2011, 11:57 AM

## 2011-08-03 NOTE — Progress Notes (Signed)
Pt still vomiting and in pain w/o pain order. Call to OR to see when they will be calling for pt or if I need to call for additional PRNs. Meredith [name] said to bring her on over and they would get her comfortable. Family called back to see prior to OR

## 2011-08-03 NOTE — Op Note (Signed)
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 Patsi Sears, MD  Radiologist: Fredia Sorrow  Anesthesia:  general  Preparation: After appropriate preanesthesia, the patient was brought to the operating room, and placed on the operating table in the dorsal supine position where general endotracheal anesthesia was introduced. She was replaced in the prone position, where the left flank was prepped with Betadine solution, and draped in usual fashion. The patient had previously undergone left percutaneous nephrostomy tube placement by Dr. Bonnielee Haff in radiology. Timeout was observed. The patient was appropriately marked.  Review history: The patient is a 31 year old female recently admitted to the hospital for pyelonephritis, with the finding of 20 mm x 10 mm stone located in the left lower pole calyceal diverticulum. She is now for percutaneous nephrolithotomy.  Statement of  Likelihood of Success: Excellent. TIME-OUT observed.:  Procedure: The left percutaneous nephrostomy tube was identified, and guidewire was placed into the bladder as it a safety wire, and a wire placed into the kidney. The balloon dilator was placed into the kidney, and dilated for 5 minutes. Because the approach to the calyceal diverticulum was medial, it was felt that there was a larger amount of paraspinous muscle to be dilated. Following this dilation, the 24 French access sheath was passed into the diverticulum, and multiple stones were identified. Using the lithoclast, the stones were fragmented, and suctioned out. Some of the stones were captured and sent for examination. Repeat evaluation showed that the patient is a small intrarenal pelvis. The narrowed neck of the diverticulum was dilated with the dilute dilating wires, and was felt that there was some extravasation of the calyceal  diverticular wall. Therefore, at the end of procedure, with no visible stone left, placed a 7 [place] by 24 cm double-J stent into the bladder, and coiled in the renal pelvis with direct vision showing coiling of the upper portion of the double-J stent. In addition, placed a 18 [place] council catheter into the renal pelvis with 3 cc of contrast in the balloon with excellent visualization of the balloon. X. Parillo was then injected into the tissue tract. The patient had been given IV Tylenol prior to the case, covered with 2 g of Ancef, and given IV Toradol at the end of the procedure. She was awakened and taken to recovery room in good condition.

## 2011-08-04 LAB — CREATININE, SERUM
GFR calc Af Amer: >90 mL/min (ref >90)
GFR calc non Af Amer: >90 mL/min (ref >90)

## 2011-08-04 MED ORDER — ACETAMINOPHEN 10 MG/ML IV SOLN
1000.0000 mg | Freq: Four times a day (QID) | INTRAVENOUS | Status: AC
  Administered 2011-08-04 – 2011-08-05 (×2): 1000 mg via INTRAVENOUS
  Filled 2011-08-04 (×4): qty 100

## 2011-08-04 NOTE — Progress Notes (Signed)
Urology Progress Note  1 Day Post-Op   Subjective:     No acute urologic events overnight. Ambulation: poor   negative Flatus: none   negative Bowel movement  negative  Pain :significant nausea/vomiting, possibly related to Dilauded.   Objective:  Blood pressure 108/72, pulse 101, temperature 97.8 F (36.6 C), temperature source Oral, resp. rate 16, height 5\' 2"  (1.575 m), weight 70.308 kg (155 lb), SpO2 98.00%.  Physical Exam:  General:  No acute distress, awake Resp: clear to auscultation bilaterally Genitourinary:  *normal. Foley in place. Perc in place. Foley: bloody    I/O last 3 completed shifts: In: 3465.8 [I.V.:3165.8; IV Piggyback:300] Out: 2000 [Urine:1950; Other:25; Blood:25]  No results found for this basename: HGB:2,WBC:2,PLT:2 in the last 72 hours  No results found for this basename: NA:2,K:2,CL:2,CO2:2,BUN:2,CREATININE:2,CALCIUM:2,MAGNESIUM:2,GFRNONAA:2,GFRAA:2 in the last 72 hours   No results found for this basename: PT:2,INR:2,APTT:2 in the last 72 hours   No components found with this basename: ABG:2  Assessment/Plan: Change to admit status. Stop dilauded. Continue IV Tylenol. Ck Cr. Increase fluids to 150cc/he.   Catheter not removed.

## 2011-08-04 NOTE — Progress Notes (Signed)
Pt reports feeling very dizzy and nauseated. VSS.  Assisted to [facility].  Pt voided 25cc bloody urine and vomited moderate amount of green bile. Emptied nephrostomy tube measuring 50cc bloody urine.  MD notified. Nino Parsley

## 2011-08-05 ENCOUNTER — Inpatient Hospital Stay (HOSPITAL_COMMUNITY): Payer: BC Managed Care – PPO

## 2011-08-05 ENCOUNTER — Encounter (HOSPITAL_COMMUNITY): Payer: Self-pay | Admitting: Urology

## 2011-08-05 LAB — DIFFERENTIAL
Lymphocytes Relative: 10 % — ABNORMAL LOW (ref 12–46)
Lymphs Abs: 1.3 10*3/uL (ref 0.7–4.0)
Monocytes Relative: 10 % (ref 3–12)
Neutro Abs: 10.7 10*3/uL — ABNORMAL HIGH (ref 1.7–7.7)
Neutrophils Relative %: 80 % — ABNORMAL HIGH (ref 43–77)

## 2011-08-05 LAB — BASIC METABOLIC PANEL
BUN: 4 mg/dL — ABNORMAL LOW (ref 6–23)
Chloride: 104 mEq/L (ref 96–112)
GFR calc non Af Amer: >90 mL/min (ref >90)
Glucose, Bld: 128 mg/dL — ABNORMAL HIGH (ref 70–99)
Potassium: 3 mEq/L — ABNORMAL LOW (ref 3.5–5.1)
Sodium: 136 mEq/L (ref 135–145)

## 2011-08-05 LAB — CBC
Hemoglobin: 11.4 g/dL — ABNORMAL LOW (ref 12.0–15.0)
Platelets: 230 10*3/uL (ref 150–400)
RBC: 3.8 MIL/uL — ABNORMAL LOW (ref 3.87–5.11)
WBC: 13.4 10*3/uL — ABNORMAL HIGH (ref 4.0–10.5)

## 2011-08-05 MED ORDER — IOHEXOL 300 MG/ML  SOLN
10.0000 mL | Freq: Once | INTRAMUSCULAR | Status: AC | PRN
  Administered 2011-08-05: 10 mL

## 2011-08-05 MED ORDER — SODIUM CHLORIDE 0.45 % IV SOLN
INTRAVENOUS | Status: DC
  Administered 2011-08-05: via INTRAVENOUS
  Administered 2011-08-06: 100 mL/h via INTRAVENOUS

## 2011-08-05 NOTE — Progress Notes (Signed)
Patient c/o nausea whenever she moves in bed or gets up, she is also c/o pain in back and abdomen but unable to take PO meds due to her nausea. Dr Patsi Sears called, awaiting call back. Will continue to monitor closely.  Marisa Sprinkles RN

## 2011-08-05 NOTE — Progress Notes (Signed)
Urology Progress Note  2 Days Post-Op Percutaneous nephrolithotomy  Subjective:     No acute urologic events overnight. Continues to have nausea, not controlled with zofran. Ambulation: bedrest. + scds Flatus:   Decreased bowelsounds Bowel movement: none  Pain: Pain from hyperemesis  Objective:  Blood pressure 107/67, pulse 105, temperature 98 F (36.7 C), temperature source Oral, resp. rate 20, height 5\' 2"  (1.575 m), weight 70.308 kg (155 lb), SpO2 97.00%.  Physical Exam:  General:  acute distress 2ndary hyoperemesis, awake Resp: clear to auscultation bilaterally Genitourinary:  Foley out, perc tube out. ( JJ remains). Foley:out.    I/O last 3 completed shifts: In: 5200 [P.O.:600; I.V.:4400; IV Piggyback:200] Out: 4025 [Urine:4025]  No results found for this basename: HGB:2,WBC:2,PLT:2 in the last 72 hours  Recent Labs  [facility] 08/04/11 1408   NA --   K --   CL --   CO2 --   BUN --   CREATININE 0.80   CALCIUM --   GFRNONAA >90   GFRAA >90     No results found for this basename: PT:2,INR:2,APTT:2 in the last 72 hours   No components found with this basename: ABG:2  Assessment/Plan: Hyperemesis. Will get [facility] evaluation. Note: had perc nephrostogram today: no extravisation. Perc tube removed. Fragment of  stone seen in lower pole. May need lithotripsy in the future, but kidney is protected with JJ stent for now..   Wound care discussed.Consider d/c of antibiotics ( ? Cause for emesis.). D/c foley . Ambulate, IM consult.

## 2011-08-06 ENCOUNTER — Encounter (HOSPITAL_COMMUNITY): Payer: Self-pay | Admitting: Internal Medicine

## 2011-08-06 DIAGNOSIS — R112 Nausea with vomiting, unspecified: Secondary | ICD-10-CM | POA: Diagnosis not present

## 2011-08-06 DIAGNOSIS — N200 Calculus of kidney: Secondary | ICD-10-CM | POA: Diagnosis present

## 2011-08-06 DIAGNOSIS — E876 Hypokalemia: Secondary | ICD-10-CM | POA: Diagnosis present

## 2011-08-06 LAB — BASIC METABOLIC PANEL
BUN: 7 mg/dL (ref 6–23)
CO2: 25 mEq/L (ref 19–32)
Calcium: 8.2 mg/dL — ABNORMAL LOW (ref 8.4–10.5)
GFR calc non Af Amer: 78 mL/min — ABNORMAL LOW (ref >90)
Glucose, Bld: 178 mg/dL — ABNORMAL HIGH (ref 70–99)
Potassium: 3.2 mEq/L — ABNORMAL LOW (ref 3.5–5.1)
Sodium: 138 mEq/L (ref 135–145)

## 2011-08-06 LAB — CBC
HCT: 33.2 % — ABNORMAL LOW (ref 36.0–46.0)
Hemoglobin: 11.5 g/dL — ABNORMAL LOW (ref 12.0–15.0)
MCH: 30.3 pg (ref 26.0–34.0)
MCHC: 34.6 g/dL (ref 30.0–36.0)
RBC: 3.8 MIL/uL — ABNORMAL LOW (ref 3.87–5.11)

## 2011-08-06 MED ORDER — POTASSIUM CHLORIDE CRYS ER 20 MEQ PO TBCR
20.0000 meq | EXTENDED_RELEASE_TABLET | Freq: Once | ORAL | Status: AC
  Administered 2011-08-06: 20 meq via ORAL
  Filled 2011-08-06: qty 1

## 2011-08-06 NOTE — Consults (Signed)
Patient's PCP: No primary provider on file.  Referring Physician: Dr. Patsi Sears  Chief Complaint: Nausea and vomiting  History of Present Illness: Palma Buster is a 31 y.o. Caucasian female with history of renal calculi who was initially admitted on January 21 of 2013 with left flank pain for nausea and vomiting.  CT showed 10 mm stone without hydronephrosis.  Patient had percutaneous nephrostomy on 08/03/2011 then had a double-J stent placed.  Post procedure the patient had hyperemesis as a result the hospitalist service was consult and for further care and management.  Did complain of some chest tightness yesterday after vomiting.  Currently denies any chest pain.  Denies any recent fevers or chills.  Denies any abdominal pain.  Does complain of some flank pain where she had the percutaneous tube placed.  Denies any headaches or vision changes.  Today she reports her nausea is improved and was actually able to keep food down.  Meds: Scheduled Meds:   . acetaminophen  1,000 mg Intravenous Q6H  . potassium chloride  20 mEq Oral Once  . senna-docusate  2 tablet Oral QHS  . sulfamethoxazole-trimethoprim  1 tablet Oral Q12H   Continuous Infusions:   . sodium chloride 100 mL/hr (08/06/11 0844)  . dextrose 5 % and 0.45% NaCl 125 mL/hr (08/05/11 1058)  . lactated ringers Stopped (08/03/11 1656)   PRN Meds:.acetaminophen, diphenhydrAMINE, diphenhydrAMINE, ibuprofen, iohexol, ondansetron, oxybutynin, zolpidem Allergies: Morphine and related and Phenergan Past Medical History  Diagnosis Date  . Pyelonephritis 06/21/2011  . Seizures     at birth, none since   Past Surgical History  Procedure Date  . 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 Cesarean section jan 2009  . Wisdom tooth extraction 2006  . Nephrostomy 08/03/11    left  . Nephrolithotomy 08/03/2011    Procedure: NEPHROLITHOTOMY PERCUTANEOUS;  Surgeon: Kathi Ludwig, MD;  Location: [facility];   Service: Urology;  Laterality: Left;   History reviewed. No pertinent family history. History   Social History  . Marital Status: Single    Spouse Name: N/A    Number of Children: N/A  . Years of Education: N/A   Occupational History  . Not on file.   Social History Main Topics  . Smoking status: Never Smoker   . Smokeless tobacco: Never Used  . Alcohol Use: Yes     very occasional  . Drug Use: No  . Sexually Active: Yes    Birth Control/ Protection: IUD   Other Topics Concern  . Not on file   Social History Narrative  . No narrative on file  Family History: Mother with kidney stones otherwise healthy.  Review of Systems: All systems reviewed with the patient and positive as per history of present illness, otherwise all other systems are negative.  Physical Exam: Blood pressure 109/74, pulse 106, temperature 98.3 F (36.8 C), temperature source Oral, resp. rate 20, height 5\' 2"  (1.575 m), weight 70.308 kg (155 lb), SpO2 96.00%. General: Awake, Oriented x3, No acute distress. HEENT: EOMI, Moist mucous membranes Neck: Supple CV: S1 and S2 Lungs: Clear to ascultation bilaterally Abdomen: Soft, Nontender, Nondistended, +bowel sounds. Ext: Good pulses. Trace edema. No clubbing or cyanosis noted. Neuro: Cranial Nerves II-XII grossly intact. Has 5/5 motor strength in upper and lower extremities. Back: Bandage over left mid back where the patient had percutaneous nephrostomy tube.   Lab results:  [facility] 08/06/11 0903 08/05/11 2120  NA 138 136  K 3.2* 3.0*  CL 105 104  CO2 25 21  GLUCOSE 178* 128*  BUN 7 4*  CREATININE 0.97 0.81  CALCIUM 8.2* 8.5  MG 1.7 --  PHOS -- --   No results found for this basename: AST:2,ALT:2,ALKPHOS:2,BILITOT:2,PROT:2,ALBUMIN:2 in the last 72 hours No results found for this basename: LIPASE:2,AMYLASE:2 in the last 72 hours  Basename 08/06/11 0903 08/05/11 2120  WBC 11.5* 13.4*  NEUTROABS -- 10.7*  HGB 11.5* 11.4*  HCT 33.2* 33.0*   MCV 87.4 86.8  PLT 216 230   No results found for this basename: CKTOTAL:3,CKMB:3,CKMBINDEX:3,TROPONINI:3 in the last 72 hours No components found with this basename: POCBNP:3 No results found for this basename: DDIMER in the last 72 hours No results found for this basename: HGBA1C:2 in the last 72 hours No results found for this basename: CHOL:2,HDL:2,LDLCALC:2,TRIG:2,CHOLHDL:2,LDLDIRECT:2 in the last 72 hours No results found for this basename: TSH,T4TOTAL,FREET3,T3FREE,THYROIDAB in the last 72 hours No results found for this basename: VITAMINB12:2,FOLATE:2,FERRITIN:2,TIBC:2,IRON:2,RETICCTPCT:2 in the last 72 hours Imaging results:  Dg Abd 1 View  08/05/2011  *RADIOLOGY REPORT*  Clinical Data: Status post left kidney stone removal.  Hyper emesis.  ABDOMEN - 1 VIEW  Comparison: Abdominal film and intraoperative films 08/05/2011.  Findings: The nephrostomy tube has been removed.  A double-J ureteral stent is in place on the left.  An IUD is noted.  The bowel gas pattern is unremarkable.  The axial skeleton is within normal limits.  IMPRESSION:  1.  Left double-J ureteral stent in place. 2.  Nonspecific bowel gas pattern. 3.  IUD.  Original Report Authenticated By: Jamesetta Orleans. MATTERN, M.D.   Ir Perc Nephrostomy Left  08/03/2011  *RADIOLOGY REPORT*  Clinical Data/Indication: LEFT RENAL CALCULUS  PERC NEPHROSTOMY*L*  Sedation: Versed three mg, Fentanyl 200 mg.  Total Moderate Sedation Time: 25 minutes.  Contrast Volume: 10 ml Omnipaque-300.  Additional Medications: Ciprofloxacin 4 mg IV.  Fluoroscopy Time: 8.7 minutes.  Procedure: The procedure, risks, benefits, and alternatives were explained to the patient. Questions regarding the procedure were encouraged and answered. The patient understands and consents to the procedure.  The left back was prepped with betadine in a sterile fashion, and a sterile drape was applied covering the operative field. A sterile gown and sterile gloves were used for  the procedure.  Under fluoroscopic guidance, a 22 gauge needle was inserted into the area of the collecting system containing the calculi within the posterior left kidney.  Contrast was injected opacifying the collecting system.  It became apparent that the stones are probably contained within a calyceal diverticulum to a posterior calix.  This was removed over a 0018 wire.  The tract was dilated with the Aprima set.  A Kumpe catheter was advanced over the wire and into the collecting system.  The 018 wire was exchanged for a glide wire which was advanced through the pelvis and into the bladder.  The Kumpe was advanced to the bladder.  Contrast was injected filling the bladder.  It was sewn in place with an O Prolene.  Findings: Spot images demonstrate the collection of calculi within the collecting system of the left kidney.  The neck series of images demonstrate access into the collecting system area containing calculi and into the renal pelvis and finally the bladder.  It is apparent that this is probably a collection of stones contained within a calyceal diverticulum associated with a posterior interpolar region calix.  Complications: None.  IMPRESSION: Successful left percutaneous nephrostomy catheter placement in preparation for  OR percutaneous lithotomy.  Original Report Authenticated By: Donavan Burnet, M.D.   Ir Dil Ureter Left  08/03/2011  *RADIOLOGY REPORT*  Clinical Data:  Left renal calculi.  Status post nephrostomy access.  Dilatation of the nephrostomy tract is performed intraoperatively for nephrolithotomy.  DILATATION OF LEFT PERCUTANEOUS NEPHROSTOMY TRACT  The nephrostomy catheter was removed over a guidewire in the operating room.  C-arm fluoroscopy was utilized.  A 9-French peel- away sheath was advanced into the ureter.  A second guidewire was then advanced into the bladder.  Percutaneous nephrostomy tract dilatation was performed with a 10 mm balloon.  A 30-French sheath was then advanced to  the level of the renal calculus under C-arm fluoroscopy.  This sheath was utilized by Dr. Patsi Sears during the nephrolithotomy procedure.  Upon completion, a 7-French, 24 cm ureteral stent was also advanced and formed in the bladder and renal pelvis.  An 18-French balloon retention Council-tip nephrostomy tube was also placed in the renal pelvis.  IMPRESSION: Intraoperative dilatation of left percutaneous nephrostomy tract for nephrolithotomy.  Original Report Authenticated By: Reola Calkins, M.D.   Dg Nephrostogram Left  08/05/2011  *RADIOLOGY REPORT*  Clinical Data: Status post intraoperative percutaneous lithotomy.  LEFT NEPHROSTOGRAM  Comparison: 08/03/2011  Procedure:  Spot images were obtained.  Contrast was then injected into the nephrostomy and imaging was obtained.  The nephrostomy was removed without complication.  No evidence of bleeding from the tract.  Findings: [facility] imaging demonstrates a nephrostomy tube in place. A double-J ureteral stent extends from the left kidney to the bladder.  Most of the calculi contained within the collecting system of the right kidney have been removed.  A few small calculi persist in the lower pole of the right kidney now.  Contrast injected easily traverses into the ureter towards the bladder. No evidence of contrast extravasation to suggest injury.  IMPRESSION: Most of the stones have been removed.  A few small stones persist in the lower pole of the right kidney.  Ureteral system is patent as contrast traverses beyond the ureter and double-J ureteral stent towards the bladder.  No evidence of contrast extravasation to suggest injury.  Original Report Authenticated By: Donavan Burnet, M.D.   Dg C-arm 61-120 Min-no Report  08/03/2011  CLINICAL DATA: Left kidney stone   C-ARM 61-120 MINUTES  Fluoroscopy was utilized by the requesting physician.  No radiographic  interpretation.     Assessment & Plan by Problem: 1.  Nausea and vomiting.  Multifactorial.   Likely due to anesthesia, kidney stones, hypokalemia and recent procedures.  Since yesterday patient reports significant improvement in nausea and currently is able to tolerate a diet.  Suggest conservative management and giving her anti-emetics before a meal.  Patient symptoms continues to improve since yesterday.  2. Nephrolithiasis.  Management as per urology.  3. Hypokalemia.  Replace as needed.  Time spent on admission, talking to the patient, and coordinating care was: 45 mins.  Jayelle Page A, MD 08/06/2011, 11:37 AM

## 2011-08-06 NOTE — Progress Notes (Signed)
Urology Progress Note  3 Days Post-Op   Subjective:     No acute urologic events overnight. Ambulation: yes Flatus: yes    Bowel movement no   Pain:good with Tylenol  Objective:  Blood pressure 109/74, pulse 106, temperature 98.3 F (36.8 C), temperature source Oral, resp. rate 20, height 5\' 2"  (1.575 m), weight 70.308 kg (155 lb), SpO2 96.00%.  Physical Exam:  General:  No acute distress, awake  Genitourinary:  No s-p pain. Mild Flank pain.  Foley: out.  + vopid    I/O last 3 completed shifts: In: 4760 [P.O.:360; I.V.:4200; IV Piggyback:200] Out: 3325 [Urine:3325]  Recent Labs  [facility] 08/06/11 0903 08/05/11 2120   HGB 11.5* 11.4*   WBC 11.5* 13.4*   PLT 216 230    Recent Labs  Basename 08/06/11 0903 08/05/11 2120   NA 138 136   K 3.2* 3.0*   CL 105 104   CO2 25 21   BUN 7 4*   CREATININE 0.97 0.81   CALCIUM 8.2* 8.5   GFRNONAA 78* >90   GFRAA >90 >90     No results found for this basename: PT:2,INR:2,APTT:2 in the last 72 hours   No components found with this basename: ABG:2  Assessment/Plan: Pt now looks normal. No more emesis. Off all pain and antiemetics. Will take tylenol at home only. Will hold off on antibiotics. Needs waterproof bandage.   Wound care discussed.

## 2011-08-06 NOTE — Discharge Summary (Signed)
Physician Discharge Summary  Patient ID: Meredith [name] MRN: [id number] DOB/AGE: Jul 31, 1980 31 y.o.  Admit date: 08/03/2011 Discharge date: 08/06/2011  Admission Diagnoses: renal calculi, renal calyceal diverticulum, hyperemesis post op  Discharge Diagnoses: same   Discharged Condition: stable  Hospital Course: surgery Monday 1/21  Consults:[facility]  Treatments: none  Discharge Exam: Blood pressure 109/74, pulse 106, temperature 98.3 F (36.8 C), temperature source Oral, resp. rate 20, height 5\' 2"  (1.575 m), weight 70.308 kg (155 lb), SpO2 96.00%. General appearance: alert, cooperative and no distress  Disposition: Home or Self Care  Discharge Orders    Future Orders Please Complete By Expires   Diet - low sodium heart healthy      Increase activity slowly      Discontinue IV        Medication List  As of 08/06/2011 12:46 PM   ASK your doctor about these medications         ibuprofen 800 MG tablet   Commonly known as: ADVIL,MOTRIN   Take 800 mg by mouth every 8 (eight) hours as needed. Pain        sulfamethoxazole-trimethoprim 800-160 MG per tablet   Commonly known as: BACTRIM DS   Take 0.5 tablets by mouth daily.      zolpidem 5 MG tablet   Commonly known as: AMBIEN   Take 5 mg by mouth at bedtime as needed. Sleep             Follow-up Information    Follow up with Jethro Bolus I, MD. (pt has appointment)    Contact information:   [address] [place], [address] [facility] [place] [address] [phone]          Signed: Jethro Bolus I 08/06/2011, 12:46 PM

## 2011-09-10 ENCOUNTER — Other Ambulatory Visit (INDEPENDENT_AMBULATORY_CARE_PROVIDER_SITE_OTHER): Payer: BC Managed Care – PPO

## 2011-09-10 DIAGNOSIS — N926 Irregular menstruation, unspecified: Secondary | ICD-10-CM

## 2011-10-29 ENCOUNTER — Emergency Department (HOSPITAL_COMMUNITY)
Admission: EM | Admit: 2011-10-29 | Discharge: 2011-10-29 | Disposition: A | Discharge status: Home / Self Care | Payer: Self-pay | Source: Home / Self Care | Attending: Emergency Medicine | Admitting: Emergency Medicine

## 2011-10-29 ENCOUNTER — Encounter (HOSPITAL_COMMUNITY): Payer: Self-pay | Admitting: *Deleted

## 2011-10-29 DIAGNOSIS — L247 Irritant contact dermatitis due to plants, except food: Secondary | ICD-10-CM

## 2011-10-29 DIAGNOSIS — L255 Unspecified contact dermatitis due to plants, except food: Secondary | ICD-10-CM

## 2011-10-29 MED ORDER — HYDROXYZINE HCL 25 MG PO TABS: 25.0000 mg | ORAL_TABLET | Freq: Four times a day (QID) | ORAL | Status: DC | PRN

## 2011-10-29 MED ORDER — HYDROXYZINE HCL 25 MG PO TABS: 25.0000 mg | ORAL_TABLET | Freq: Four times a day (QID) | ORAL | Status: AC | PRN

## 2011-10-29 MED ORDER — METHYLPREDNISOLONE ACETATE 40 MG/ML IJ SUSP
80.0000 mg | Freq: Once | INTRAMUSCULAR | Status: AC
  Administered 2011-10-29: 80 mg via INTRAMUSCULAR

## 2011-10-29 MED ORDER — TRIAMCINOLONE ACETONIDE 0.1 % EX CREA: TOPICAL_CREAM | Freq: Three times a day (TID) | CUTANEOUS | Status: DC

## 2011-10-29 MED ORDER — METHYLPREDNISOLONE ACETATE 80 MG/ML IJ SUSP
INTRAMUSCULAR | Status: AC
  Filled 2011-10-29: qty 1

## 2011-10-29 NOTE — ED Provider Notes (Signed)
History     CSN: [id number]  Arrival date & time 10/29/11  1111   First MD Initiated Contact with Patient 10/29/11 1153      Chief Complaint  Patient presents with  . Rash    (Consider location/radiation/quality/duration/timing/severity/associated sxs/prior treatment) HPI Comments: Patient presented to urgent care complaining of an ongoing and exacerbated rash that started after she did some yard work and she suspected she was exposed to poison ivy. Rest of both forearms mainly the right side and with the patches of redness with itchiness and some of them are weeping clear fluid. Have some lesions in her abdomen around the umbilical area armpits and legs have noticed also that her left eyelid have been come swollen and itchy. Been trying to take Benadryl but his not doing it seems like it's spreading"  Patient is a 31 y.o. female presenting with rash. The history is provided by the patient.  Rash  This is a new problem. The current episode started more than 1 week ago. The problem has been gradually worsening. Associated with: working outdoors. There has been no fever. The rash is present on the torso, face, abdomen, trunk, right upper leg, right wrist, right hand, left upper leg and left lower leg. The patient is experiencing no pain. The pain has been worsening since onset. Associated symptoms include blisters, itching and weeping. She has tried antihistamines for the symptoms. The treatment provided no relief.    Past Medical History  Diagnosis Date  . Pyelonephritis 06/21/2011  . Seizures     at birth, none since    Past Surgical History  Procedure Date  . 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 Cesarean section jan 2009  . Wisdom tooth extraction 2006  . Nephrostomy 08/03/11    left  . Nephrolithotomy 08/03/2011    Procedure: NEPHROLITHOTOMY PERCUTANEOUS;  Surgeon: Kathi Ludwig, MD;  Location: [facility];  Service: Urology;  Laterality: Left;     Family History  Problem Relation Age of Onset  . Urolithiasis Mother     History  Substance Use Topics  . Smoking status: Never Smoker   . Smokeless tobacco: Never Used  . Alcohol Use: Yes     very occasional    OB History    Grav Para Term Preterm Abortions TAB SAB Ect Mult Living                  Review of Systems  Constitutional: Negative for fever, activity change and fatigue.  HENT: Negative for congestion.   Skin: Positive for itching and rash. Negative for wound.    Allergies  Morphine and related and Phenergan  Home Medications   Current Outpatient Rx  Name Route Sig Dispense Refill  . HYDROXYZINE HCL 25 MG PO TABS Oral Take 1 tablet (25 mg total) by mouth every 6 (six) hours as needed for itching. 30 tablet 0  . TRIAMCINOLONE ACETONIDE 0.1 % EX CREA Topical Apply topically 3 (three) times daily. Apply affceted area "(except face) for 2 weeks) 85.2 g 0    BP 104/72  Pulse 60  Temp 98.7 F (37.1 C)  Resp 16  SpO2 99%  LMP 09/30/2011  Physical Exam  Constitutional: She appears well-developed and well-nourished.  HENT:  Head: Normocephalic.  Eyes: Conjunctivae are normal.  Neck: No thyromegaly present.  Abdominal: She exhibits no mass.  Skin: Rash noted.       ED Course  Procedures (  including critical care time)  Labs Reviewed - No data to display No results found.   1. Contact dermatitis and eczema due to plant       MDM  The patient developed a chondrodermatitis after she has been doing some yard work. She seemed to think that she has visualized and poison ivy pruritic generalized rash. No oral mucosal bulb        Jimmie Molly, MD 10/29/11 1309

## 2011-10-29 NOTE — ED Notes (Signed)
Pt  Developed  Rash     1  Week  Ago she  Reports   She   Was           Outside  Financial trader  Work   Possibly  Exposed  To poison ivy       The  Rash itches  And  Is  Spreading        She  Is  In no acute  Distress  Speaking in  Complete  sentances

## 2011-10-29 NOTE — Discharge Instructions (Signed)
Poison Ivy Poison ivy is a rash caused by touching the leaves of the poison ivy plant. The rash often shows up 48 hours later. You might just have bumps, redness, and itching. Sometimes, blisters appear and break open. Your eyes may get puffy (swollen). Poison ivy often heals in 2 to 3 weeks without treatment. HOME CARE  If you touch poison ivy:   Wash your skin with soap and water right away. Wash under your fingernails. Do not rub the skin very hard.   Wash any clothes you were wearing.   Avoid poison ivy in the future. Poison ivy has 3 leaves on a stem.   Use medicine to help with itching as told by your doctor. Do not drive when you take this medicine.   Keep open sores dry, clean, and covered with a bandage and medicated cream, if needed.   Ask your doctor about medicine for children.  GET HELP RIGHT AWAY IF:  You have open sores.   Redness spreads beyond the area of the rash.   There is yellowish white fluid (pus) coming from the rash.   Pain gets worse.   You have a temperature by mouth above 102 F (38.9 C), not controlled by medicine.  MAKE SURE YOU:  Understand these instructions.   Will watch your condition.   Will get help right away if you are not doing well or get worse.  Document Released: 08/01/2010 Document Revised: 06/18/2011 Document Reviewed: 08/01/2010 Exit[employer] Patient Information 2012 [employer]. 

## 2012-02-19 ENCOUNTER — Telehealth: Payer: Self-pay | Admitting: Obstetrics and Gynecology

## 2012-02-19 NOTE — Telephone Encounter (Signed)
Ar pt 

## 2012-02-19 NOTE — Telephone Encounter (Signed)
TRIAGE/EPIC °

## 2012-02-23 ENCOUNTER — Telehealth: Payer: Self-pay | Admitting: Obstetrics and Gynecology

## 2012-02-24 ENCOUNTER — Telehealth: Payer: Self-pay

## 2012-02-24 NOTE — Telephone Encounter (Signed)
Pt states that she has had Mirena in for over 4 years and she has always had regular periods. Now she hasn't had a period since June. She did a upt which was negative about 2-3 weeks ago. She is panicky that she may have a tubal pregnancy for some reason. She has no insurance right now and cannot get into the health dept for 6-8 weeks. [facility] has shut down. She is wondering what to do. I told her I'd ask around about possible resources to refer her to for a pt with no ins. And get back to her with info. Melody Comas A

## 2012-02-29 ENCOUNTER — Telehealth: Payer: Self-pay

## 2012-02-29 NOTE — Telephone Encounter (Signed)
Pt was called and advised to go to the [facility]. Pt was having cycle issues with IUD in place.  Pt does not have any insurance at this time. The Health Department denied treatment. Mathis Bud

## 2012-03-03 ENCOUNTER — Ambulatory Visit: Payer: Self-pay | Admitting: Physician Assistant

## 2012-03-03 VITALS — BP 117/74 | HR 99 | Temp 98.6°F | Resp 16 | Ht 62.5 in | Wt 156.2 lb

## 2012-03-03 DIAGNOSIS — Z111 Encounter for screening for respiratory tuberculosis: Secondary | ICD-10-CM

## 2012-03-03 DIAGNOSIS — R350 Frequency of micturition: Secondary | ICD-10-CM

## 2012-03-03 LAB — POCT UA - MICROSCOPIC ONLY
Crystals, Ur, HPF, POC: NEGATIVE
Mucus, UA: NEGATIVE

## 2012-03-03 LAB — POCT URINALYSIS DIPSTICK
Bilirubin, UA: NEGATIVE
Ketones, UA: NEGATIVE
Protein, UA: NEGATIVE
pH, UA: 6

## 2012-03-03 MED ORDER — SULFAMETHOXAZOLE-TRIMETHOPRIM 800-160 MG PO TABS: 1.0000 | ORAL_TABLET | Freq: Two times a day (BID) | ORAL | Status: AC

## 2012-03-03 MED ORDER — PHENAZOPYRIDINE HCL 200 MG PO TABS: 200.0000 mg | ORAL_TABLET | Freq: Three times a day (TID) | ORAL | Status: DC | PRN

## 2012-03-03 MED ORDER — SULFAMETHOXAZOLE-TRIMETHOPRIM 800-160 MG PO TABS: 1.0000 | ORAL_TABLET | Freq: Two times a day (BID) | ORAL | Status: DC

## 2012-03-03 MED ORDER — PHENAZOPYRIDINE HCL 200 MG PO TABS: 200.0000 mg | ORAL_TABLET | Freq: Three times a day (TID) | ORAL | Status: AC | PRN

## 2012-03-03 NOTE — Progress Notes (Signed)
  Subjective:    Patient ID: Meredith [name], female    DOB: [birth year], 31 y.o.   MRN: [id number]  HPI This 31 y.o. female presents for evaluation of urinary urgency, frequency and burning for several days.  Also, she needs a PPD for school ([employer]).   No fever, chills, vomiting.  Has had a few episodes of nausea.  She has several known kidney stones remaining after nephrolithotomy in January.  No vaginal symptoms.  Review of Systems No chest pain, SOB, HA, dizziness, vision change, diarrhea, constipation, dysuria, urinary urgency or frequency, myalgias, arthralgias or rash.  1. Has the patient ever had a TB Skin Test?: yes  2. Has the patient had a TB Skin Test in the past 6 weeks? no   3. Has the patient ever had a positive reaction? no   4. Has the patient ever had a BCG vaccine? no  5. Has the patient ever had Tuberculosis? no  6. Has the patient been in recent contact with anyone known or suspected of having     active TB disease?: no       7. Has the patient ever been advised by a healthcare provider not to have a TB skin test? no   Verified in allergy area and with patient that the patient is not allergic to the products PPD is made of (Phenol or Tween). Yes  Is patient taking any oral or IV steroid medication now or have they taken it in the last month? no  Patient advised to return for reading within 48-72 hours.   Past Medical History  Diagnosis Date  . Pyelonephritis 06/21/2011  . Seizures     at birth, none since    Past Surgical History  Procedure Date  . 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 Cesarean section jan 2009  . Wisdom tooth extraction 2006  . Nephrostomy 08/03/11    left  . Nephrolithotomy 08/03/2011    Procedure: NEPHROLITHOTOMY PERCUTANEOUS;  Surgeon: Kathi Ludwig, MD;  Location: [facility];  Service: Urology;  Laterality: Left;    Allergies  Allergen Reactions  . Morphine And Related Nausea And Vomiting  .  Promethazine Hcl Other (See Comments)    hypotention    History   Social History  . Marital Status: Married    Spouse Name: Renda Rolls    Number of Children: 2  . Years of Education: N/A   Occupational History  . student     [employer] program   Social History Main Topics  . Smoking status: Never Smoker   . Smokeless tobacco: Never Used  . Alcohol Use: Yes     very occasional  . Drug Use: No  . Sexually Active: Yes -- Female partner(s)    Birth Control/ Protection: IUD-Mirena   Other Topics Concern  . Not on file   Social History Narrative  . No narrative on file    Family History  Problem Relation Age of Onset  . Urolithiasis Mother   . Hypertension Maternal Grandmother   . Thyroid disease Maternal Grandmother   . Dementia Maternal Grandmother   . Heart disease Maternal Grandfather   . Diabetes Maternal Grandfather        Objective:   Physical Exam        Assessment & Plan:

## 2012-03-03 NOTE — Patient Instructions (Signed)
Get plenty of rest and drink at least 64 ounces of water.  Refrain from vaginal intercourse until your symptoms have resolved and you have completed the antibiotic.

## 2012-03-06 ENCOUNTER — Encounter (INDEPENDENT_AMBULATORY_CARE_PROVIDER_SITE_OTHER): Payer: Self-pay

## 2012-03-06 DIAGNOSIS — Z111 Encounter for screening for respiratory tuberculosis: Secondary | ICD-10-CM

## 2012-03-06 LAB — TB SKIN TEST
Induration: 0 mm
TB Skin Test: NEGATIVE

## 2012-03-07 LAB — URINE CULTURE

## 2012-08-02 ENCOUNTER — Telehealth: Payer: Self-pay | Admitting: Obstetrics and Gynecology

## 2012-08-02 NOTE — Telephone Encounter (Signed)
Spoke to pt who only has [employer] right now. Her Mirena is getting ready to expire. She's wondering what she needs to do, since she cannot get into the Health Dept. For several mos. My best advice to her was to use condoms until she can be seen at [facility] for removal of IUD and then discuss at the time of her visit another option of BC. Her case worker has given her a list of other places to call to see if she can be seen sooner, but she has had no luck. She is agreeable to using condoms and she will make appt at the [facility]. Melody Comas A

## 2012-08-02 NOTE — Telephone Encounter (Signed)
Pt called says she has seen dr Su Hilt in the last five years has iud and would like it removed,but no insurance. Does iud expire or does she have to find a job with insurance. And does not want to get pregnant. pe

## 2012-08-11 ENCOUNTER — Encounter: Payer: Medicaid Other | Admitting: Obstetrics and Gynecology

## 2012-08-18 ENCOUNTER — Ambulatory Visit: Payer: Medicaid Other | Admitting: Obstetrics and Gynecology

## 2012-08-24 ENCOUNTER — Encounter: Payer: Self-pay | Admitting: Obstetrics and Gynecology

## 2012-08-24 ENCOUNTER — Other Ambulatory Visit (HOSPITAL_COMMUNITY)
Admission: RE | Admit: 2012-08-24 | Payer: Medicaid Other | Source: Ambulatory Visit | Admitting: Obstetrics and Gynecology

## 2012-08-24 ENCOUNTER — Ambulatory Visit (INDEPENDENT_AMBULATORY_CARE_PROVIDER_SITE_OTHER): Payer: Medicaid Other | Admitting: Obstetrics and Gynecology

## 2012-08-24 ENCOUNTER — Other Ambulatory Visit (HOSPITAL_COMMUNITY)
Admission: RE | Admit: 2012-08-24 | Discharge: 2012-08-24 | Disposition: A | Discharge status: Home / Self Care | Payer: Medicaid Other | Source: Ambulatory Visit | Attending: Obstetrics and Gynecology | Admitting: Obstetrics and Gynecology

## 2012-08-24 VITALS — BP 108/66 | HR 81 | Ht 62.0 in | Wt 159.0 lb

## 2012-08-24 DIAGNOSIS — Z01419 Encounter for gynecological examination (general) (routine) without abnormal findings: Secondary | ICD-10-CM | POA: Insufficient documentation

## 2012-08-24 DIAGNOSIS — Z1151 Encounter for screening for human papillomavirus (HPV): Secondary | ICD-10-CM | POA: Insufficient documentation

## 2012-08-24 DIAGNOSIS — Z23 Encounter for immunization: Secondary | ICD-10-CM

## 2012-08-24 NOTE — Progress Notes (Signed)
  Subjective:     Meredith [name] is a 32 y.o. female G2P2 with LMP 08/02/2012 and who is here for a comprehensive physical exam. The patient reports presence of a odor. Patient reports having this odor last year when diagnosed with pyelonephritis. She states she only notices the odor when urinating and during intercourse. She currently denies any vaginal discharge, dysuria or frequency. Patient also reports the presence of a small bump on vagina, non-painful. Patient reports cleansing daily with vagisil.  Patient using IUD for birth control, she is due to be replaced  History   Social History  . Marital Status: Married    Spouse Name: Renda Rolls    Number of Children: 2  . Years of Education: N/A   Occupational History  . student     [employer]   Social History Main Topics  . Smoking status: Never Smoker   . Smokeless tobacco: Never Used  . Alcohol Use: Yes     Comment: very occasional  . Drug Use: No  . Sexually Active: Yes -- Female partner(s)    Birth Control/ Protection: IUD   Other Topics Concern  . Not on file   Social History Narrative  . No narrative on file   Health Maintenance  Topic Date Due  . Influenza Vaccine  03/13/1982  . Pap Smear  12/25/1999  . Tetanus/tdap  10/02/2020       Review of Systems A comprehensive review of systems was negative.   Objective:      GENERAL: Well-developed, well-nourished female in no acute distress.  HEENT: Normocephalic, atraumatic. Sclerae anicteric.  NECK: Supple. Normal thyroid.  LUNGS: Clear to auscultation bilaterally.  HEART: Regular rate and rhythm. BREASTS: Symmetric in size. No palpable masses or lymphadenopathy, skin changes, or nipple drainage. ABDOMEN: Soft, nontender, nondistended. No organomegaly. PELVIC: Normal external female genitalia. 5 mm cyst near clitoral hood on right labia minora. Vagina is pink and rugated.  Normal discharge. Normal appearing cervix. Uterus is normal in size. No adnexal mass  or tenderness. EXTREMITIES: No cyanosis, clubbing, or edema, 2+ distal pulses.    Assessment:    Healthy female exam.      Plan:    Pap smear collected Wet prep collected Will check urine culture HIV performed at patient request. She declined all other STD testing RTC for IUD replacement Patient advised to perform monthly self breast and vulva exams See After Visit Summary for Counseling Recommendations

## 2012-08-24 NOTE — Patient Instructions (Signed)
Preventive Care for Adults, Female A healthy lifestyle and preventive care can promote health and wellness. Preventive health guidelines for women include the following key practices.  A routine yearly physical is a good way to check with your caregiver about your health and preventive screening. It is a chance to share any concerns and updates on your health, and to receive a thorough exam.  Visit your dentist for a routine exam and preventive care every 6 months. Brush your teeth twice a day and floss once a day. Good oral hygiene prevents tooth decay and gum disease.  The frequency of eye exams is based on your age, health, family medical history, use of contact lenses, and other factors. Follow your caregiver's recommendations for frequency of eye exams.  Eat a healthy diet. Foods like vegetables, fruits, whole grains, low-fat dairy products, and lean protein foods contain the nutrients you need without too many calories. Decrease your intake of foods high in solid fats, added sugars, and salt. Eat the right amount of calories for you.Get information about a proper diet from your caregiver, if necessary.  Regular physical exercise is one of the most important things you can do for your health. Most adults should get at least 150 minutes of moderate-intensity exercise (any activity that increases your heart rate and causes you to sweat) each week. In addition, most adults need muscle-strengthening exercises on 2 or more days a week.  Maintain a healthy weight. The body mass index (BMI) is a screening tool to identify possible weight problems. It provides an estimate of body fat based on height and weight. Your caregiver can help determine your BMI, and can help you achieve or maintain a healthy weight.For adults 20 years and older:  A BMI below 18.5 is considered underweight.  A BMI of 18.5 to 24.9 is normal.  A BMI of 25 to 29.9 is considered overweight.  A BMI of 30 and above is  considered obese.  Maintain normal blood lipids and cholesterol levels by exercising and minimizing your intake of saturated fat. Eat a balanced diet with plenty of fruit and vegetables. Blood tests for lipids and cholesterol should begin at age 20 and be repeated every 5 years. If your lipid or cholesterol levels are high, you are over 50, or you are at high risk for heart disease, you may need your cholesterol levels checked more frequently.Ongoing high lipid and cholesterol levels should be treated with medicines if diet and exercise are not effective.  If you smoke, find out from your caregiver how to quit. If you do not use tobacco, do not start.  If you are pregnant, do not drink alcohol. If you are breastfeeding, be very cautious about drinking alcohol. If you are not pregnant and choose to drink alcohol, do not exceed 1 drink per day. One drink is considered to be 12 ounces (355 mL) of beer, 5 ounces (148 mL) of wine, or 1.5 ounces (44 mL) of liquor.  Avoid use of street drugs. Do not share needles with anyone. Ask for help if you need support or instructions about stopping the use of drugs.  High blood pressure causes heart disease and increases the risk of stroke. Your blood pressure should be checked at least every 1 to 2 years. Ongoing high blood pressure should be treated with medicines if weight loss and exercise are not effective.  If you are 55 to 31 years old, ask your caregiver if you should take aspirin to prevent strokes.  Diabetes   screening involves taking a blood sample to check your fasting blood sugar level. This should be done once every 3 years, after age 45, if you are within normal weight and without risk factors for diabetes. Testing should be considered at a younger age or be carried out more frequently if you are overweight and have at least 1 risk factor for diabetes.  Breast cancer screening is essential preventive care for women. You should practice "breast  self-awareness." This means understanding the normal appearance and feel of your breasts and may include breast self-examination. Any changes detected, no matter how small, should be reported to a caregiver. Women in their 20s and 30s should have a clinical breast exam (CBE) by a caregiver as part of a regular health exam every 1 to 3 years. After age 40, women should have a CBE every year. Starting at age 40, women should consider having a mammography (breast X-ray test) every year. Women who have a family history of breast cancer should talk to their caregiver about genetic screening. Women at a high risk of breast cancer should talk to their caregivers about having magnetic resonance imaging (MRI) and a mammography every year.  The Pap test is a screening test for cervical cancer. A Pap test can show cell changes on the cervix that might become cervical cancer if left untreated. A Pap test is a procedure in which cells are obtained and examined from the lower end of the uterus (cervix).  Women should have a Pap test starting at age 21.  Between ages 21 and 29, Pap tests should be repeated every 2 years.  Beginning at age 30, you should have a Pap test every 3 years as long as the past 3 Pap tests have been normal.  Some women have medical problems that increase the chance of getting cervical cancer. Talk to your caregiver about these problems. It is especially important to talk to your caregiver if a new problem develops soon after your last Pap test. In these cases, your caregiver may recommend more frequent screening and Pap tests.  The above recommendations are the same for women who have or have not gotten the vaccine for human papillomavirus (HPV).  If you had a hysterectomy for a problem that was not cancer or a condition that could lead to cancer, then you no longer need Pap tests. Even if you no longer need a Pap test, a regular exam is a good idea to make sure no other problems are  starting.  If you are between ages 65 and 70, and you have had normal Pap tests going back 10 years, you no longer need Pap tests. Even if you no longer need a Pap test, a regular exam is a good idea to make sure no other problems are starting.  If you have had past treatment for cervical cancer or a condition that could lead to cancer, you need Pap tests and screening for cancer for at least 20 years after your treatment.  If Pap tests have been discontinued, risk factors (such as a new sexual partner) need to be reassessed to determine if screening should be resumed.  The HPV test is an additional test that may be used for cervical cancer screening. The HPV test looks for the virus that can cause the cell changes on the cervix. The cells collected during the Pap test can be tested for HPV. The HPV test could be used to screen women aged 30 years and older, and should   be used in women of any age who have unclear Pap test results. After the age of 30, women should have HPV testing at the same frequency as a Pap test.  Colorectal cancer can be detected and often prevented. Most routine colorectal cancer screening begins at the age of 50 and continues through age 75. However, your caregiver may recommend screening at an earlier age if you have risk factors for colon cancer. On a yearly basis, your caregiver may provide home test kits to check for hidden blood in the stool. Use of a small camera at the end of a tube, to directly examine the colon (sigmoidoscopy or colonoscopy), can detect the earliest forms of colorectal cancer. Talk to your caregiver about this at age 50, when routine screening begins. Direct examination of the colon should be repeated every 5 to 10 years through age 75, unless early forms of pre-cancerous polyps or small growths are found.  Hepatitis C blood testing is recommended for all people born from 1945 through 1965 and any individual with known risks for hepatitis C.  Practice  safe sex. Use condoms and avoid high-risk sexual practices to reduce the spread of sexually transmitted infections (STIs). STIs include gonorrhea, chlamydia, syphilis, trichomonas, herpes, HPV, and human immunodeficiency virus (HIV). Herpes, HIV, and HPV are viral illnesses that have no cure. They can result in disability, cancer, and death. Sexually active women aged 25 and younger should be checked for chlamydia. Older women with new or multiple partners should also be tested for chlamydia. Testing for other STIs is recommended if you are sexually active and at increased risk.  Osteoporosis is a disease in which the bones lose minerals and strength with aging. This can result in serious bone fractures. The risk of osteoporosis can be identified using a bone density scan. Women ages 65 and over and women at risk for fractures or osteoporosis should discuss screening with their caregivers. Ask your caregiver whether you should take a calcium supplement or vitamin D to reduce the rate of osteoporosis.  Menopause can be associated with physical symptoms and risks. Hormone replacement therapy is available to decrease symptoms and risks. You should talk to your caregiver about whether hormone replacement therapy is right for you.  Use sunscreen with sun protection factor (SPF) of 30 or more. Apply sunscreen liberally and repeatedly throughout the day. You should seek shade when your shadow is shorter than you. Protect yourself by wearing long sleeves, pants, a wide-brimmed hat, and sunglasses year round, whenever you are outdoors.  Once a month, do a whole body skin exam, using a mirror to look at the skin on your back. Notify your caregiver of new moles, moles that have irregular borders, moles that are larger than a pencil eraser, or moles that have changed in shape or color.  Stay current with required immunizations.  Influenza. You need a dose every fall (or winter). The composition of the flu vaccine  changes each year, so being vaccinated once is not enough.  Pneumococcal polysaccharide. You need 1 to 2 doses if you smoke cigarettes or if you have certain chronic medical conditions. You need 1 dose at age 65 (or older) if you have never been vaccinated.  Tetanus, diphtheria, pertussis (Tdap, Td). Get 1 dose of Tdap vaccine if you are younger than age 65, are over 65 and have contact with an infant, are a healthcare worker, are pregnant, or simply want to be protected from whooping cough. After that, you need a Td   booster dose every 10 years. Consult your caregiver if you have not had at least 3 tetanus and diphtheria-containing shots sometime in your life or have a deep or dirty wound.  HPV. You need this vaccine if you are a woman age 26 or younger. The vaccine is given in 3 doses over 6 months.  Measles, mumps, rubella (MMR). You need at least 1 dose of MMR if you were born in 1957 or later. You may also need a second dose.  Meningococcal. If you are age 19 to 21 and a first-year college student living in a residence hall, or have one of several medical conditions, you need to get vaccinated against meningococcal disease. You may also need additional booster doses.  Zoster (shingles). If you are age 60 or older, you should get this vaccine.  Varicella (chickenpox). If you have never had chickenpox or you were vaccinated but received only 1 dose, talk to your caregiver to find out if you need this vaccine.  Hepatitis A. You need this vaccine if you have a specific risk factor for hepatitis A virus infection or you simply wish to be protected from this disease. The vaccine is usually given as 2 doses, 6 to 18 months apart.  Hepatitis B. You need this vaccine if you have a specific risk factor for hepatitis B virus infection or you simply wish to be protected from this disease. The vaccine is given in 3 doses, usually over 6 months. Preventive Services / Frequency Ages 19 to 39  Blood  pressure check.** / Every 1 to 2 years.  Lipid and cholesterol check.** / Every 5 years beginning at age 20.  Clinical breast exam.** / Every 3 years for women in their 20s and 30s.  Pap test.** / Every 2 years from ages 21 through 29. Every 3 years starting at age 30 through age 65 or 70 with a history of 3 consecutive normal Pap tests.  HPV screening.** / Every 3 years from ages 30 through ages 65 to 70 with a history of 3 consecutive normal Pap tests.  Hepatitis C blood test.** / For any individual with known risks for hepatitis C.  Skin self-exam. / Monthly.  Influenza immunization.** / Every year.  Pneumococcal polysaccharide immunization.** / 1 to 2 doses if you smoke cigarettes or if you have certain chronic medical conditions.  Tetanus, diphtheria, pertussis (Tdap, Td) immunization. / A one-time dose of Tdap vaccine. After that, you need a Td booster dose every 10 years.  HPV immunization. / 3 doses over 6 months, if you are 26 and younger.  Measles, mumps, rubella (MMR) immunization. / You need at least 1 dose of MMR if you were born in 1957 or later. You may also need a second dose.  Meningococcal immunization. / 1 dose if you are age 19 to 21 and a first-year college student living in a residence hall, or have one of several medical conditions, you need to get vaccinated against meningococcal disease. You may also need additional booster doses.  Varicella immunization.** / Consult your caregiver.  Hepatitis A immunization.** / Consult your caregiver. 2 doses, 6 to 18 months apart.  Hepatitis B immunization.** / Consult your caregiver. 3 doses usually over 6 months. ** Family history and personal history of risk and conditions may change your caregiver's recommendations. Document Released: 08/25/2001 Document Revised: 09/21/2011 Document Reviewed: 11/24/2010 [employer] Patient Information 2013 [employer].  

## 2012-08-24 NOTE — Progress Notes (Signed)
Patient has two marble like things on the right side of her vaginal lip.  They do not hurt, she noticed when she was cleaning.  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

## 2012-08-25 LAB — HIV ANTIBODY (ROUTINE TESTING W REFLEX): HIV: NONREACTIVE

## 2012-08-25 LAB — WET PREP, GENITAL
Trich, Wet Prep: NONE SEEN
Yeast Wet Prep HPF POC: NONE SEEN

## 2012-10-27 ENCOUNTER — Ambulatory Visit (INDEPENDENT_AMBULATORY_CARE_PROVIDER_SITE_OTHER): Payer: Medicaid Other | Admitting: Obstetrics & Gynecology

## 2012-10-27 ENCOUNTER — Encounter: Payer: Self-pay | Admitting: Obstetrics & Gynecology

## 2012-10-27 VITALS — BP 106/72 | HR 93 | Ht 62.0 in | Wt 162.6 lb

## 2012-10-27 DIAGNOSIS — Z0000 Encounter for general adult medical examination without abnormal findings: Secondary | ICD-10-CM

## 2012-10-27 DIAGNOSIS — N912 Amenorrhea, unspecified: Secondary | ICD-10-CM

## 2012-10-27 DIAGNOSIS — Z3009 Encounter for other general counseling and advice on contraception: Secondary | ICD-10-CM

## 2012-10-27 DIAGNOSIS — Z01419 Encounter for gynecological examination (general) (routine) without abnormal findings: Secondary | ICD-10-CM

## 2012-10-27 DIAGNOSIS — Z975 Presence of (intrauterine) contraceptive device: Secondary | ICD-10-CM

## 2012-10-27 DIAGNOSIS — IMO0001 Reserved for inherently not codable concepts without codable children: Secondary | ICD-10-CM

## 2012-10-27 DIAGNOSIS — Z30433 Encounter for removal and reinsertion of intrauterine contraceptive device: Secondary | ICD-10-CM

## 2012-10-27 LAB — LIPID PANEL
Cholesterol: 206 mg/dL — ABNORMAL HIGH (ref 0–200)
Total CHOL/HDL Ratio: 3.7 Ratio
Triglycerides: 130 mg/dL (ref <150)

## 2012-10-27 LAB — COMPREHENSIVE METABOLIC PANEL
ALT: 31 U/L (ref 0–35)
CO2: 26 mEq/L (ref 19–32)
Calcium: 9.7 mg/dL (ref 8.4–10.5)
Chloride: 105 mEq/L (ref 96–112)
Creat: 0.79 mg/dL (ref 0.50–1.10)
Glucose, Bld: 89 mg/dL (ref 70–99)

## 2012-10-27 LAB — CBC
HCT: 43 % (ref 36.0–46.0)
Hemoglobin: 14.7 g/dL (ref 12.0–15.0)
MCV: 85.8 fL (ref 78.0–100.0)
Platelets: 303 10*3/uL (ref 150–400)
RBC: 5.01 MIL/uL (ref 3.87–5.11)
WBC: 9.8 10*3/uL (ref 4.0–10.5)

## 2012-10-27 MED ORDER — NORGESTREL-ETHINYL ESTRADIOL 0.3-30 MG-MCG PO TABS: 1.0000 | ORAL_TABLET | Freq: Every day | ORAL | Status: DC

## 2012-10-27 NOTE — Progress Notes (Signed)
  Subjective:    Patient ID: Meredith [name], female    DOB: 16-Sep-1980, 32 y.o.   MRN: [id number]  HPI  32 yo MW P2 who is here to have her expired Mirena removed. After a long discussion of birth control options, she has decided to have a new Mirena placed.  She would like fasting screening labs today.  Review of Systems     Objective:   Physical Exam   Consent signed, Time out procedure done. Cervix prepped with betadine and grasped with a single tooth tenaculum. Her old Mirena was removed intact with some unusual amount of pulling required. I attempted to place the new Mirena without success. I changed the position of the tenaculum from anterior lip to posterior lip again without success. The Mirena applicator was noted to be bent from the effort. She tolerated the procedure well.      Assessment & Plan:  Contraception- Failed Mirena placement.  She has decided to restart OCPs.  I e prescribed generic Lo/Ovral Back up method discussed for a month and anytime she is on antibiotics.  Screening labs today RTC 2 weeks for BP check

## 2012-10-27 NOTE — Addendum Note (Signed)
Addended by: Barbara Cower on: 10/27/2012 11:45 AM   Modules accepted: Orders

## 2012-10-27 NOTE — Progress Notes (Signed)
Here today for IUD removal, it has been in since February of 2009. Wants to discuss other birth control options.  She is pleased with the Mirena, but she has noticed decrease in her sex drive since having the IUD.  Wants fasting labs drawn.  Has noticed fatigue and increase in appetite.

## 2012-10-28 ENCOUNTER — Ambulatory Visit: Payer: Medicaid Other | Admitting: Obstetrics & Gynecology

## 2012-10-28 LAB — HEPATITIS B SURFACE ANTIGEN: Hepatitis B Surface Ag: NEGATIVE

## 2012-10-28 LAB — HEMOGLOBIN A1C: Mean Plasma Glucose: 108 mg/dL (ref <117)

## 2012-10-28 LAB — RPR

## 2012-10-31 ENCOUNTER — Encounter: Payer: Self-pay | Admitting: *Deleted

## 2012-12-06 IMAGING — RF DG NEPHROSTOGRAM LEFT
4 series · 4 of 4 positions shown · non-contrast
Comparison: 08/03/2011

CLINICAL DATA: Status post intraoperative percutaneous lithotomy.

LEFT NEPHROSTOGRAM

[Series 1: run · 1 of 1 slices shown (1 of 4)]
[im 1/1]
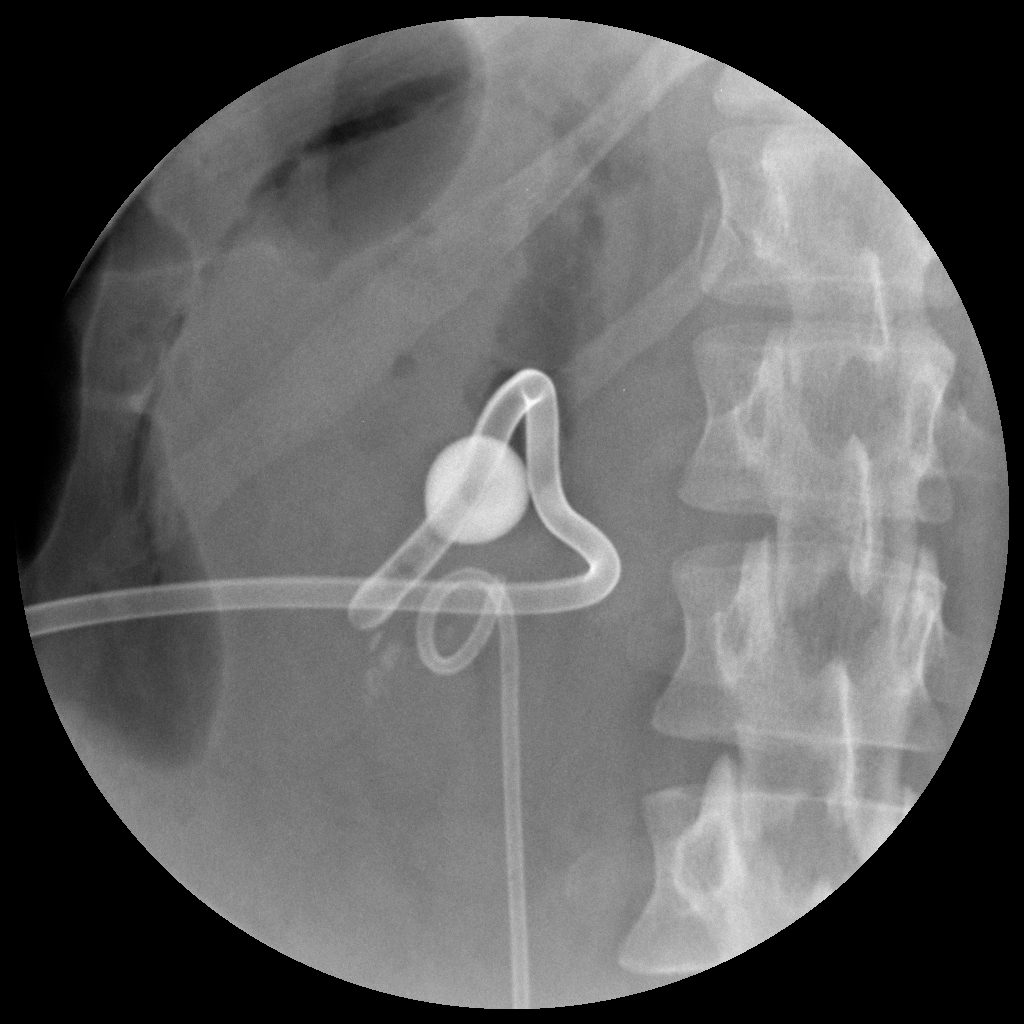

[Series 1: run · 1 of 1 slices shown (2 of 4)]
[im 1/1]
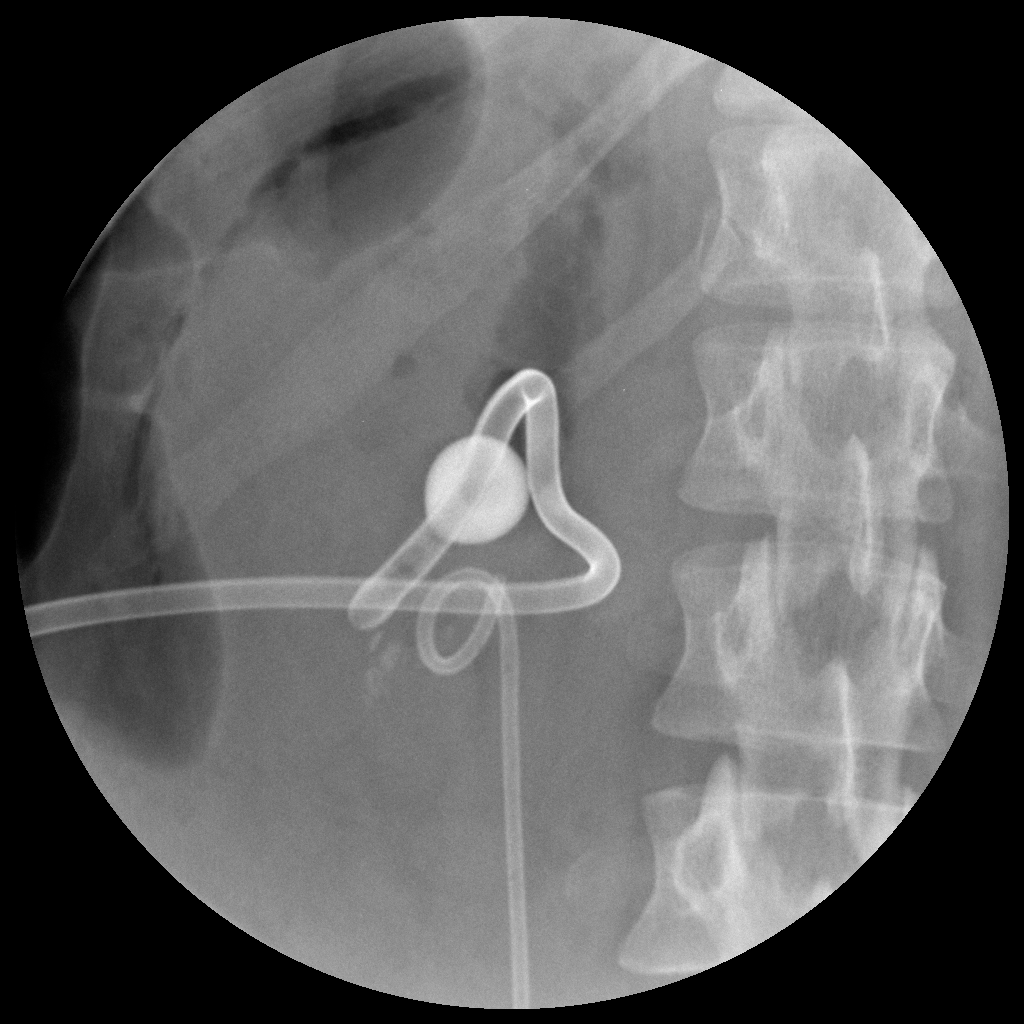

[Series 2: run · 1 of 1 slices shown (3 of 4)]
[im 1/1]
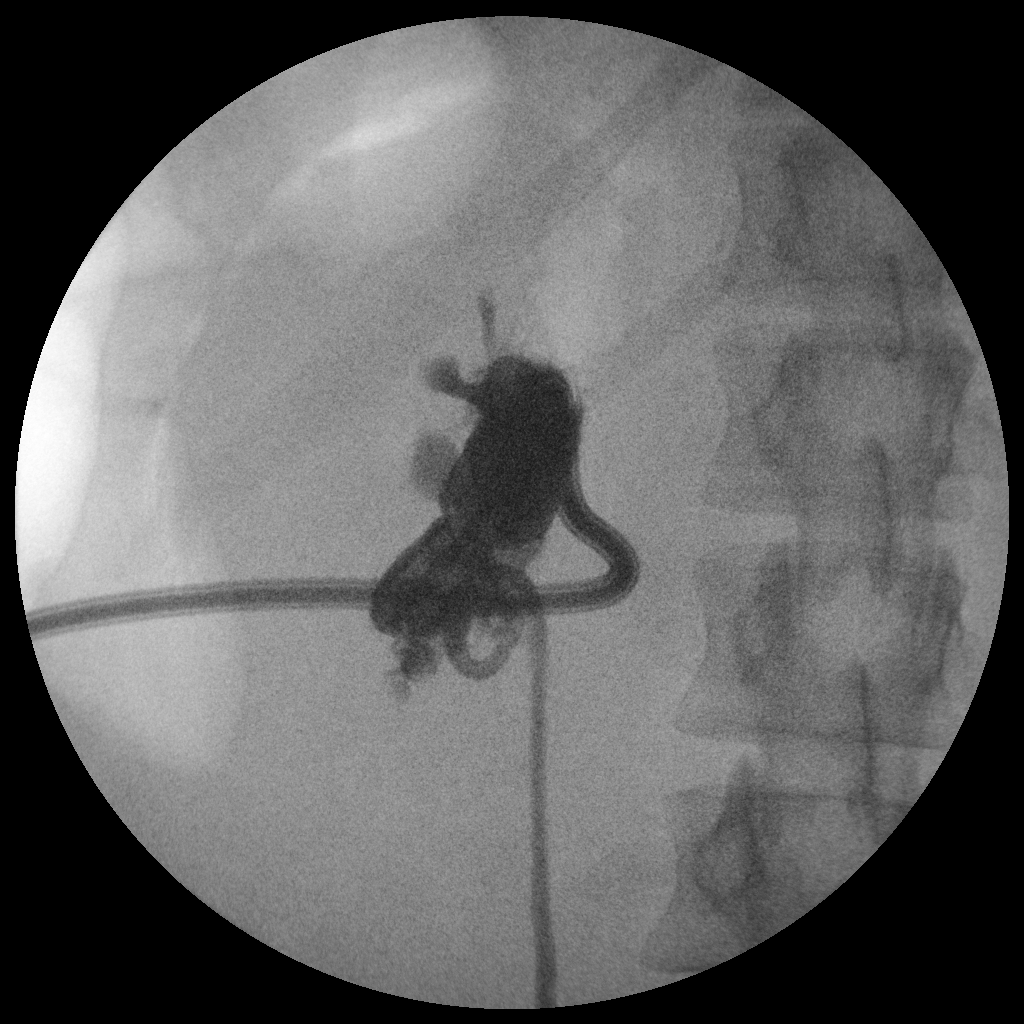

[Series 2: run · 1 of 1 slices shown (4 of 4)]
[im 1/1]
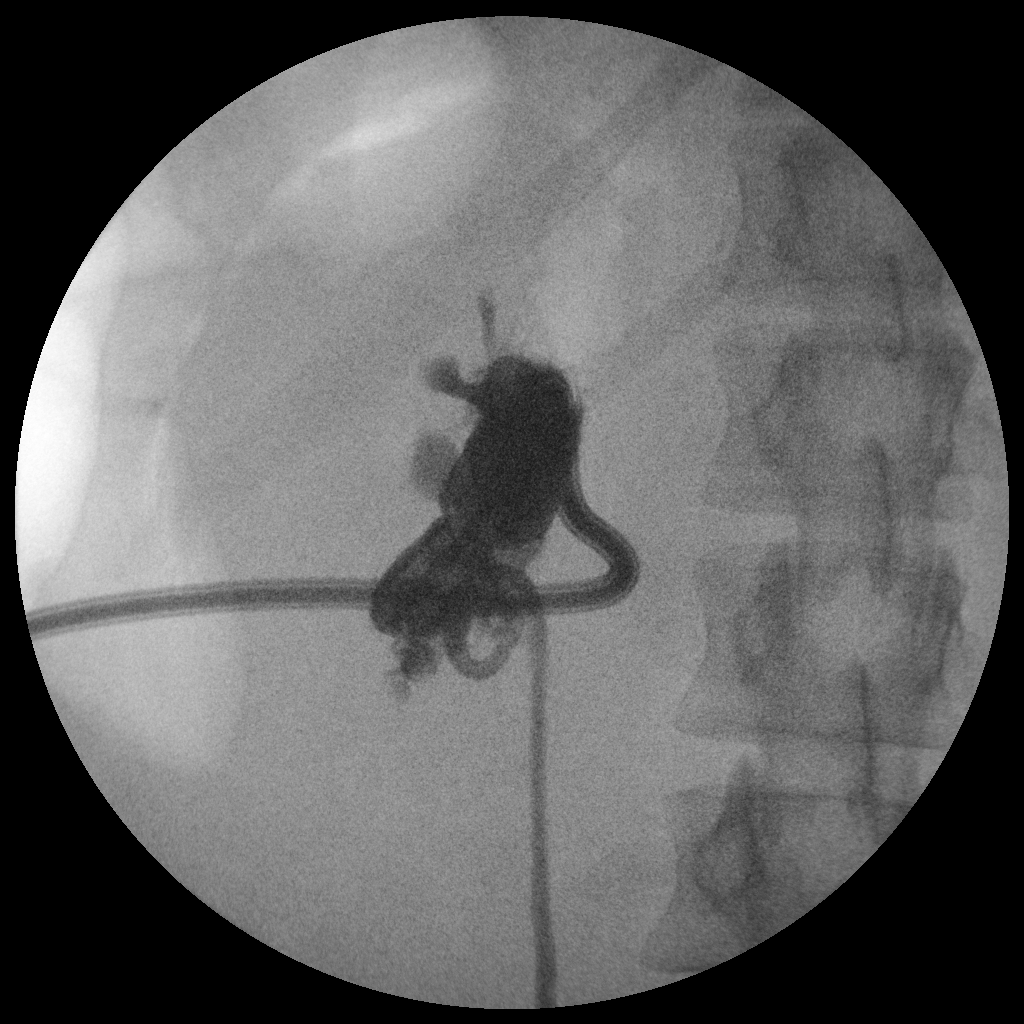

[4 of 4 positions shown; findings below may reference images not displayed]

Procedure:  Spot images were obtained.  Contrast was then injected
into the nephrostomy and imaging was obtained.  The nephrostomy was
removed without complication.  No evidence of bleeding from the
tract.
FINDINGS: [facility] imaging demonstrates a nephrostomy tube in place.
A double-J ureteral stent extends from the left kidney to the
bladder.  Most of the calculi contained within the collecting
system of the right kidney have been removed.  A few small calculi
persist in the lower pole of the right kidney now.  Contrast
injected easily traverses into the ureter towards the bladder. No
evidence of contrast extravasation to suggest injury.
IMPRESSION: Most of the stones have been removed.  A few small stones persist
in the lower pole of the right kidney.

Ureteral system is patent as contrast traverses beyond the ureter
and double-J ureteral stent towards the bladder.

No evidence of contrast extravasation to suggest injury.

## 2012-12-06 IMAGING — CR DG ABDOMEN 1V
1 series · 1 of 1 positions shown · non-contrast
Comparison: Abdominal film and intraoperative films 08/05/2011.

CLINICAL DATA: Status post left kidney stone removal.  Hyper
emesis.

ABDOMEN - 1 VIEW

[AP]
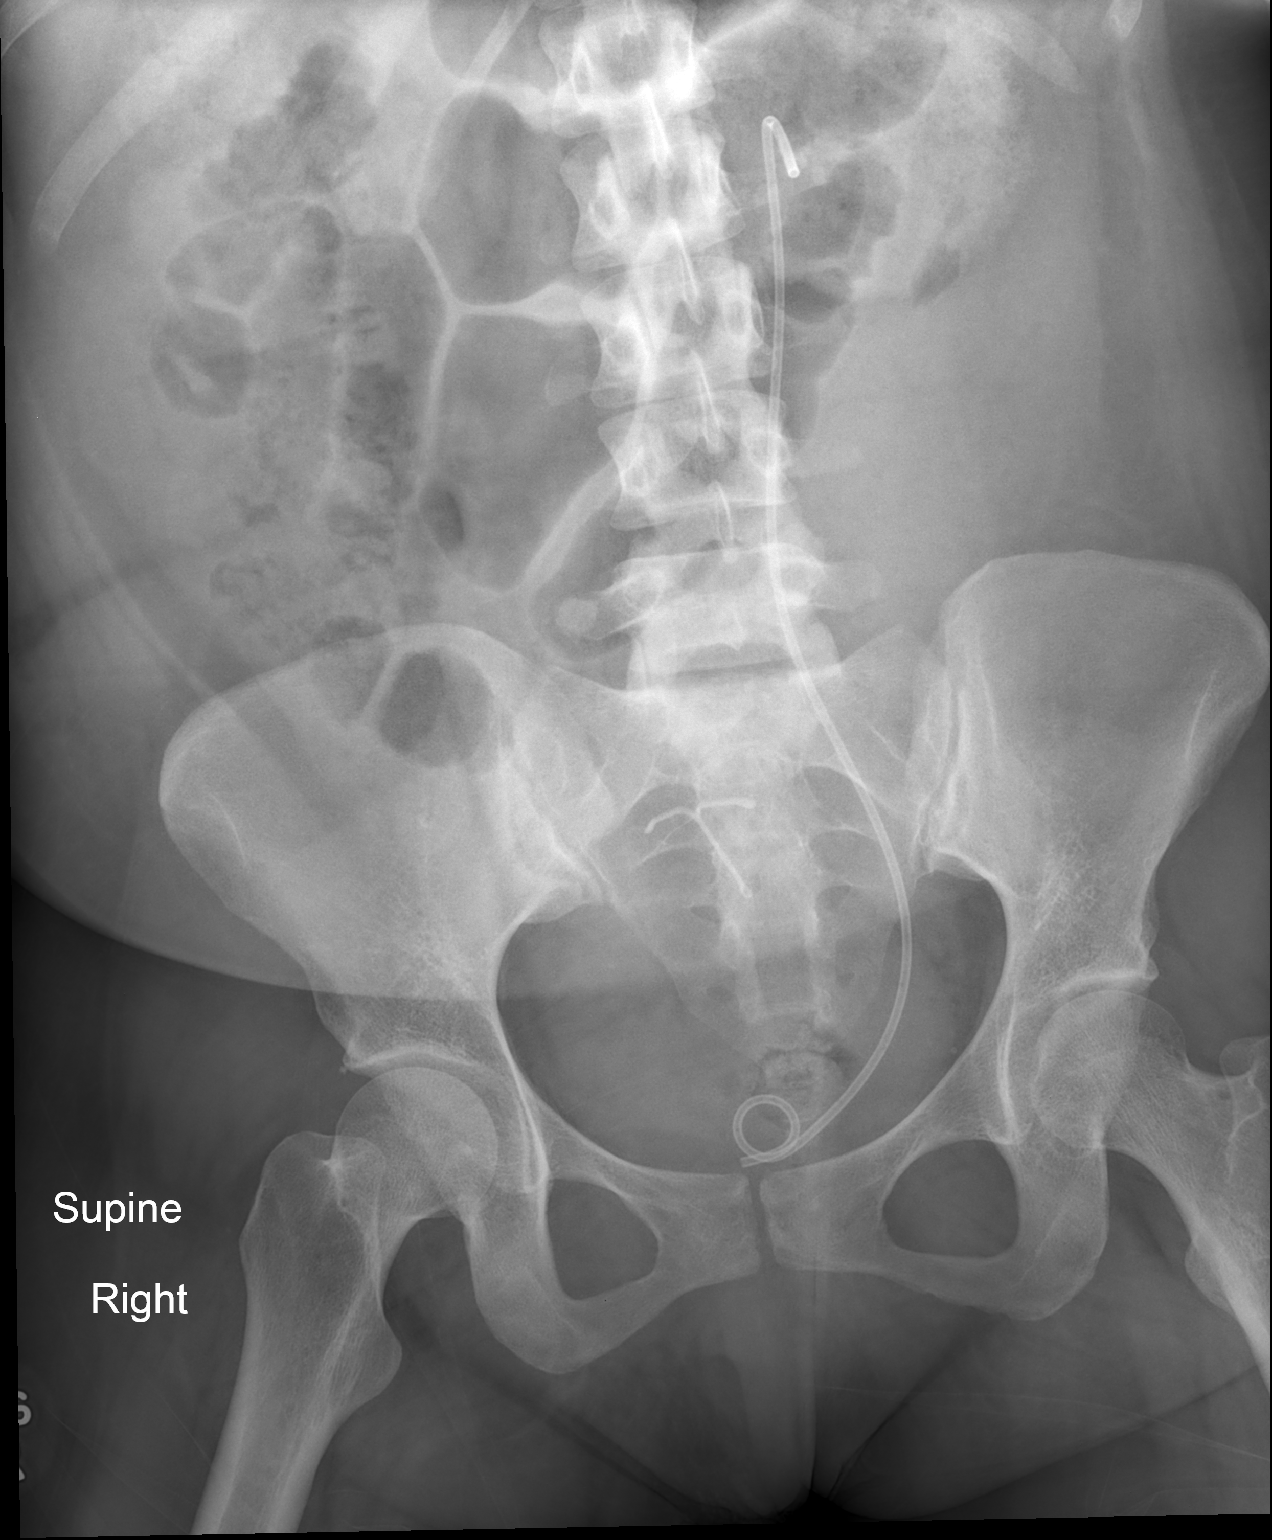

[1 of 1 positions shown; findings below may reference images not displayed]

FINDINGS: The nephrostomy tube has been removed.  A double-J
ureteral stent is in place on the left.  An IUD is noted.  The
bowel gas pattern is unremarkable.  The axial skeleton is within
normal limits.
IMPRESSION: 1.  Left double-J ureteral stent in place.
2.  Nonspecific bowel gas pattern.
3.  IUD.

## 2013-08-11 ENCOUNTER — Other Ambulatory Visit: Payer: Self-pay | Admitting: Obstetrics & Gynecology

## 2013-08-11 ENCOUNTER — Other Ambulatory Visit (HOSPITAL_COMMUNITY)
Admission: RE | Admit: 2013-08-11 | Discharge: 2013-08-11 | Disposition: A | Discharge status: Home / Self Care | Payer: BC Managed Care – PPO | Source: Ambulatory Visit | Attending: Obstetrics & Gynecology | Admitting: Obstetrics & Gynecology

## 2013-08-11 DIAGNOSIS — Z01419 Encounter for gynecological examination (general) (routine) without abnormal findings: Secondary | ICD-10-CM | POA: Insufficient documentation

## 2013-08-11 DIAGNOSIS — N760 Acute vaginitis: Secondary | ICD-10-CM | POA: Insufficient documentation

## 2013-08-11 DIAGNOSIS — Z113 Encounter for screening for infections with a predominantly sexual mode of transmission: Secondary | ICD-10-CM | POA: Diagnosis present

## 2013-09-04 LAB — OB RESULTS CONSOLE ABO/RH: RH Type: NEGATIVE

## 2013-09-04 LAB — OB RESULTS CONSOLE ANTIBODY SCREEN: Antibody Screen: NEGATIVE

## 2013-09-04 LAB — OB RESULTS CONSOLE HEPATITIS B SURFACE ANTIGEN: Hepatitis B Surface Ag: NEGATIVE

## 2013-09-04 LAB — OB RESULTS CONSOLE RPR: RPR: NONREACTIVE

## 2014-03-19 ENCOUNTER — Inpatient Hospital Stay (HOSPITAL_COMMUNITY)
Admission: AD | Admit: 2014-03-19 | Discharge: 2014-03-20 | Disposition: A | Discharge status: Home / Self Care | Payer: BC Managed Care – PPO | Source: Ambulatory Visit | Attending: Obstetrics and Gynecology | Admitting: Obstetrics and Gynecology

## 2014-03-19 ENCOUNTER — Encounter (HOSPITAL_COMMUNITY): Payer: Self-pay

## 2014-03-19 DIAGNOSIS — O34219 Maternal care for unspecified type scar from previous cesarean delivery: Secondary | ICD-10-CM | POA: Insufficient documentation

## 2014-03-19 DIAGNOSIS — Z98891 History of uterine scar from previous surgery: Secondary | ICD-10-CM

## 2014-03-19 DIAGNOSIS — R109 Unspecified abdominal pain: Secondary | ICD-10-CM | POA: Insufficient documentation

## 2014-03-19 DIAGNOSIS — O4700 False labor before 37 completed weeks of gestation, unspecified trimester: Secondary | ICD-10-CM | POA: Insufficient documentation

## 2014-03-19 DIAGNOSIS — O4703 False labor before 37 completed weeks of gestation, third trimester: Secondary | ICD-10-CM

## 2014-03-19 NOTE — MAU Note (Signed)
"  tightness in stomach and cramping" for 30 mins. No bleeding or LOF. +FM.

## 2014-03-20 DIAGNOSIS — R109 Unspecified abdominal pain: Secondary | ICD-10-CM | POA: Diagnosis present

## 2014-03-20 DIAGNOSIS — Z98891 History of uterine scar from previous surgery: Secondary | ICD-10-CM

## 2014-03-20 DIAGNOSIS — O34219 Maternal care for unspecified type scar from previous cesarean delivery: Secondary | ICD-10-CM | POA: Diagnosis not present

## 2014-03-20 DIAGNOSIS — O4700 False labor before 37 completed weeks of gestation, unspecified trimester: Secondary | ICD-10-CM | POA: Diagnosis not present

## 2014-03-20 DIAGNOSIS — O4703 False labor before 37 completed weeks of gestation, third trimester: Secondary | ICD-10-CM

## 2014-03-20 NOTE — Discharge Instructions (Signed)
Third Trimester of Pregnancy °The third trimester is from week 29 through week 42, months 7 through 9. The third trimester is a time when the fetus is growing rapidly. At the end of the ninth month, the fetus is about 20 inches in length and weighs 6-10 pounds.  °BODY CHANGES °Your body goes through many changes during pregnancy. The changes vary from woman to woman.  °· Your weight will continue to increase. You can expect to gain 25-35 pounds (11-16 kg) by the end of the pregnancy. °· You may begin to get stretch marks on your hips, abdomen, and breasts. °· You may urinate more often because the fetus is moving lower into your pelvis and pressing on your bladder. °· You may develop or continue to have heartburn as a result of your pregnancy. °· You may develop constipation because certain hormones are causing the muscles that push waste through your intestines to slow down. °· You may develop hemorrhoids or swollen, bulging veins (varicose veins). °· You may have pelvic pain because of the weight gain and pregnancy hormones relaxing your joints between the bones in your pelvis. Backaches may result from overexertion of the muscles supporting your posture. °· You may have changes in your hair. These can include thickening of your hair, rapid growth, and changes in texture. Some women also have hair loss during or after pregnancy, or hair that feels dry or thin. Your hair will most likely return to normal after your baby is born. °· Your breasts will continue to grow and be tender. A yellow discharge may leak from your breasts called colostrum. °· Your belly button may stick out. °· You may feel short of breath because of your expanding uterus. °· You may notice the fetus "dropping," or moving lower in your abdomen. °· You may have a bloody mucus discharge. This usually occurs a few days to a week before labor begins. °· Your cervix becomes thin and soft (effaced) near your due date. °WHAT TO EXPECT AT YOUR PRENATAL  EXAMS  °You will have prenatal exams every 2 weeks until week 36. Then, you will have weekly prenatal exams. During a routine prenatal visit: °· You will be weighed to make sure you and the fetus are growing normally. °· Your blood pressure is taken. °· Your abdomen will be measured to track your baby's growth. °· The fetal heartbeat will be listened to. °· Any test results from the previous visit will be discussed. °· You may have a cervical check near your due date to see if you have effaced. °At around 36 weeks, your caregiver will check your cervix. At the same time, your caregiver will also perform a test on the secretions of the vaginal tissue. This test is to determine if a type of bacteria, Group B streptococcus, is present. Your caregiver will explain this further. °Your caregiver may ask you: °· What your birth plan is. °· How you are feeling. °· If you are feeling the baby move. °· If you have had any abnormal symptoms, such as leaking fluid, bleeding, severe headaches, or abdominal cramping. °· If you have any questions. °Other tests or screenings that may be performed during your third trimester include: °· Blood tests that check for low iron levels (anemia). °· Fetal testing to check the health, activity level, and growth of the fetus. Testing is done if you have certain medical conditions or if there are problems during the pregnancy. °FALSE LABOR °You may feel small, irregular contractions that   eventually go away. These are called Braxton Hicks contractions, or false labor. Contractions may last for hours, days, or even weeks before true labor sets in. If contractions come at regular intervals, intensify, or become painful, it is best to be seen by your caregiver.  °SIGNS OF LABOR  °· Menstrual-like cramps. °· Contractions that are 5 minutes apart or less. °· Contractions that start on the top of the uterus and spread down to the lower abdomen and back. °· A sense of increased pelvic pressure or back  pain. °· A watery or bloody mucus discharge that comes from the vagina. °If you have any of these signs before the 37th week of pregnancy, call your caregiver right away. You need to go to the hospital to get checked immediately. °HOME CARE INSTRUCTIONS  °· Avoid all smoking, herbs, alcohol, and unprescribed drugs. These chemicals affect the formation and growth of the baby. °· Follow your caregiver's instructions regarding medicine use. There are medicines that are either safe or unsafe to take during pregnancy. °· Exercise only as directed by your caregiver. Experiencing uterine cramps is a good sign to stop exercising. °· Continue to eat regular, healthy meals. °· Wear a good support bra for breast tenderness. °· Do not use hot tubs, steam rooms, or saunas. °· Wear your seat belt at all times when driving. °· Avoid raw meat, uncooked cheese, cat litter boxes, and soil used by cats. These carry germs that can cause birth defects in the baby. °· Take your prenatal vitamins. °· Try taking a stool softener (if your caregiver approves) if you develop constipation. Eat more high-fiber foods, such as fresh vegetables or fruit and whole grains. Drink plenty of fluids to keep your urine clear or pale yellow. °· Take warm sitz baths to soothe any pain or discomfort caused by hemorrhoids. Use hemorrhoid cream if your caregiver approves. °· If you develop varicose veins, wear support hose. Elevate your feet for 15 minutes, 3-4 times a day. Limit salt in your diet. °· Avoid heavy lifting, wear low heal shoes, and practice good posture. °· Rest a lot with your legs elevated if you have leg cramps or low back pain. °· Visit your dentist if you have not gone during your pregnancy. Use a soft toothbrush to brush your teeth and be gentle when you floss. °· A sexual relationship may be continued unless your caregiver directs you otherwise. °· Do not travel far distances unless it is absolutely necessary and only with the approval  of your caregiver. °· Take prenatal classes to understand, practice, and ask questions about the labor and delivery. °· Make a trial run to the hospital. °· Pack your hospital bag. °· Prepare the baby's nursery. °· Continue to go to all your prenatal visits as directed by your caregiver. °SEEK MEDICAL CARE IF: °· You are unsure if you are in labor or if your water has broken. °· You have dizziness. °· You have mild pelvic cramps, pelvic pressure, or nagging pain in your abdominal area. °· You have persistent nausea, vomiting, or diarrhea. °· You have a bad smelling vaginal discharge. °· You have pain with urination. °SEEK IMMEDIATE MEDICAL CARE IF:  °· You have a fever. °· You are leaking fluid from your vagina. °· You have spotting or bleeding from your vagina. °· You have severe abdominal cramping or pain. °· You have rapid weight loss or gain. °· You have shortness of breath with chest pain. °· You notice sudden or extreme swelling   of your face, hands, ankles, feet, or legs. °· You have not felt your baby move in over an hour. °· You have severe headaches that do not go away with medicine. °· You have vision changes. °Document Released: 06/23/2001 Document Revised: 07/04/2013 Document Reviewed: 08/30/2012 °[employer]® Patient Information ©2015 [employer]. This information is not intended to replace advice given to you by your health care provider. Make sure you discuss any questions you have with your health care provider. °Fetal Movement Counts °Patient Name: __________________________________________________ Patient Due Date: ____________________ °Performing a fetal movement count is highly recommended in high-risk pregnancies, but it is good for every pregnant woman to do. Your health care provider may ask you to start counting fetal movements at 28 weeks of the pregnancy. Fetal movements often increase: °· After eating a full meal. °· After physical activity. °· After eating or drinking something sweet or  cold. °· At rest. °Pay attention to when you feel the baby is most active. This will help you notice a pattern of your baby's sleep and wake cycles and what factors contribute to an increase in fetal movement. It is important to perform a fetal movement count at the same time each day when your baby is normally most active.  °HOW TO COUNT FETAL MOVEMENTS °1. Find a quiet and comfortable area to sit or lie down on your left side. Lying on your left side provides the best blood and oxygen circulation to your baby. °2. Write down the day and time on a sheet of paper or in a journal. °3. Start counting kicks, flutters, swishes, rolls, or jabs in a 2-hour period. You should feel at least 10 movements within 2 hours. °4. If you do not feel 10 movements in 2 hours, wait 2-3 hours and count again. Look for a change in the pattern or not enough counts in 2 hours. °SEEK MEDICAL CARE IF: °· You feel less than 10 counts in 2 hours, tried twice. °· There is no movement in over an hour. °· The pattern is changing or taking longer each day to reach 10 counts in 2 hours. °· You feel the baby is not moving as he or she usually does. °Date: ____________ Movements: ____________ Start time: ____________ Finish time: ____________  °Date: ____________ Movements: ____________ Start time: ____________ Finish time: ____________ °Date: ____________ Movements: ____________ Start time: ____________ Finish time: ____________ °Date: ____________ Movements: ____________ Start time: ____________ Finish time: ____________ °Date: ____________ Movements: ____________ Start time: ____________ Finish time: ____________ °Date: ____________ Movements: ____________ Start time: ____________ Finish time: ____________ °Date: ____________ Movements: ____________ Start time: ____________ Finish time: ____________ °Date: ____________ Movements: ____________ Start time: ____________ Finish time: ____________  °Date: ____________ Movements: ____________ Start  time: ____________ Finish time: ____________ °Date: ____________ Movements: ____________ Start time: ____________ Finish time: ____________ °Date: ____________ Movements: ____________ Start time: ____________ Finish time: ____________ °Date: ____________ Movements: ____________ Start time: ____________ Finish time: ____________ °Date: ____________ Movements: ____________ Start time: ____________ Finish time: ____________ °Date: ____________ Movements: ____________ Start time: ____________ Finish time: ____________ °Date: ____________ Movements: ____________ Start time: ____________ Finish time: ____________  °Date: ____________ Movements: ____________ Start time: ____________ Finish time: ____________ °Date: ____________ Movements: ____________ Start time: ____________ Finish time: ____________ °Date: ____________ Movements: ____________ Start time: ____________ Finish time: ____________ °Date: ____________ Movements: ____________ Start time: ____________ Finish time: ____________ °Date: ____________ Movements: ____________ Start time: ____________ Finish time: ____________ °Date: ____________ Movements: ____________ Start time: ____________ Finish time: ____________ °Date: ____________ Movements: ____________ Start time: ____________ Finish time:   ____________  °Date: ____________ Movements: ____________ Start time: ____________ Finish time: ____________ °Date: ____________ Movements: ____________ Start time: ____________ Finish time: ____________ °Date: ____________ Movements: ____________ Start time: ____________ Finish time: ____________ °Date: ____________ Movements: ____________ Start time: ____________ Finish time: ____________ °Date: ____________ Movements: ____________ Start time: ____________ Finish time: ____________ °Date: ____________ Movements: ____________ Start time: ____________ Finish time: ____________ °Date: ____________ Movements: ____________ Start time: ____________ Finish time: ____________    °Date: ____________ Movements: ____________ Start time: ____________ Finish time: ____________ °Date: ____________ Movements: ____________ Start time: ____________ Finish time: ____________ °Date: ____________ Movements: ____________ Start time: ____________ Finish time: ____________ °Date: ____________ Movements: ____________ Start time: ____________ Finish time: ____________ °Date: ____________ Movements: ____________ Start time: ____________ Finish time: ____________ °Date: ____________ Movements: ____________ Start time: ____________ Finish time: ____________ °Date: ____________ Movements: ____________ Start time: ____________ Finish time: ____________  °Date: ____________ Movements: ____________ Start time: ____________ Finish time: ____________ °Date: ____________ Movements: ____________ Start time: ____________ Finish time: ____________ °Date: ____________ Movements: ____________ Start time: ____________ Finish time: ____________ °Date: ____________ Movements: ____________ Start time: ____________ Finish time: ____________ °Date: ____________ Movements: ____________ Start time: ____________ Finish time: ____________ °Date: ____________ Movements: ____________ Start time: ____________ Finish time: ____________ °Date: ____________ Movements: ____________ Start time: ____________ Finish time: ____________  °Date: ____________ Movements: ____________ Start time: ____________ Finish time: ____________ °Date: ____________ Movements: ____________ Start time: ____________ Finish time: ____________ °Date: ____________ Movements: ____________ Start time: ____________ Finish time: ____________ °Date: ____________ Movements: ____________ Start time: ____________ Finish time: ____________ °Date: ____________ Movements: ____________ Start time: ____________ Finish time: ____________ °Date: ____________ Movements: ____________ Start time: ____________ Finish time: ____________ °Date: ____________ Movements: ____________  Start time: ____________ Finish time: ____________  °Date: ____________ Movements: ____________ Start time: ____________ Finish time: ____________ °Date: ____________ Movements: ____________ Start time: ____________ Finish time: ____________ °Date: ____________ Movements: ____________ Start time: ____________ Finish time: ____________ °Date: ____________ Movements: ____________ Start time: ____________ Finish time: ____________ °Date: ____________ Movements: ____________ Start time: ____________ Finish time: ____________ °Date: ____________ Movements: ____________ Start time: ____________ Finish time: ____________ °Document Released: 07/29/2006 Document Revised: 11/13/2013 Document Reviewed: 04/25/2012 °[employer]® Patient Information ©2015 [employer]. This information is not intended to replace advice given to you by your health care provider. Make sure you discuss any questions you have with your health care provider. ° °

## 2014-03-20 NOTE — [facility] Provider Note (Signed)
History  33 yo G3P2002 @ 36.2 wks presents unannounced to [facility]U w/ c/o cramping x 30 min. Reports active fetus. Denies VB or LOF.   Patient Active Problem List   Diagnosis Date Noted  . Preterm uterine contractions in third trimester, antepartum 03/20/2014  . History of C-section 03/20/2014  . Nephrolithiasis 08/06/2011    Chief Complaint  Patient presents with  . Labor Eval   HPI See above OB History   Grav Para Term Preterm Abortions TAB SAB Ect Mult Living   0 0 0 0 0 0 2      Past Medical History  Diagnosis Date  . 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 Pyelonephritis 06/21/2011    10 kidney stones surgically removed    Past Surgical History  Procedure Laterality Date  . 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 Cesarean section  jan 2009  . Wisdom tooth extraction  2006  . Nephrostomy  08/03/11    left  . Nephrolithotomy  08/03/2011    Procedure: NEPHROLITHOTOMY PERCUTANEOUS;  Surgeon: Kathi Ludwig, MD;  Location: [facility];  Service: Urology;  Laterality: Left;  Toney Reil iud N/A 2009    Family History  Problem Relation Age of Onset  . Urolithiasis Mother   . Hypertension Maternal Grandmother   . Thyroid disease Maternal Grandmother   . Dementia Maternal Grandmother   . Heart disease Maternal Grandfather   . Diabetes Maternal Grandfather     History  Substance Use Topics  . Smoking status: Never Smoker   . Smokeless tobacco: Never Used  . Alcohol Use: No     Comment: very occasional    Allergies:  Allergies  Allergen Reactions  . Morphine And Related Nausea And Vomiting  . Promethazine Hcl Other (See Comments)    hypotention    No prescriptions prior to admission    ROS Ctxs, +FM Physical Exam   Blood pressure 115/63, pulse 94, temperature 97.9 F (36.6 C), temperature source Oral, resp. rate 18, height 5' 1.4" (1.56 m), weight 181 lb 3.2 oz (82.192 kg), SpO2 100.00%.  Physical Exam Gen: NAD Pelvic:  FT/thick/high per RN FHRT: BL 120 w/ mod variability, +accels, no decels U/Cs: Mostly irritability ED Course  Assessment: Not in PTL Planning repeat c-section w/ BTL Cat 1 FHRT MRSA per Epic  Plan: D/C'd home w/ strict PTL precautions Keep office appt this week   Sherre Scarlet CNM, MS 03/20/2014 3:36 AM

## 2014-03-26 ENCOUNTER — Encounter (HOSPITAL_COMMUNITY): Payer: Self-pay | Admitting: Pharmacist

## 2014-03-29 NOTE — H&P (Signed)
  HPI: 33 y/o G3P2002 @ [redacted]w[redacted]d estimated gestational age (as dated by LMP c/w 8wk ultrasound) presents for scheduled repeat C-section.   no Leaking of Fluid,   no Vaginal Bleeding,   no Uterine Contractions,  + Fetal Movement.  ROS: no HA, no epigastric pain, no visual changes, intermittent back pain.    Pregnancy complicated by: 1) Prior LTCS, desires repeat C-section 2) Breech presentation 3) Size greater than dates: followed by serial [place]  @ [redacted]w[redacted]d- BREECH, anterior, EFW: 5#3oz, 2360g (52%)  @ [redacted]w[redacted]d, breech, anterior, EFW: 7#7oz, 3377g (81%) 4) Rh negative, s/p RhoGAM @ 28wks    Prenatal Transfer Tool  Maternal Diabetes: No Genetic Screening: Normal Maternal Ultrasounds/Referrals: Normal Fetal Ultrasounds or other Referrals:  None Maternal Substance Abuse:  No Significant Maternal Medications:  None Significant Maternal Lab Results: Lab values include: Group B Strep negative, Rh negative   PNL:  O neg, antibody neg, Hep B neg, RPR NR, Rubella immune, HIV neg, H/H: 13.3/39.7, plt 292, Tetra negative OBHx: 2003: vaginal delivery, complicated by shoulder dystocia, 8#8oz, female  2009: LTCS- for prior dystocia, 8#11oz, female PMHx:  History of kidney stones and pyelonephritis Meds:  PNV Allergy:   Allergies  Allergen Reactions  . Morphine And Related Nausea And Vomiting    Pt can take percocet without problems  . Promethazine Hcl Other (See Comments)    hypotention   SurgHx: right wrist surgery, C-section x 1, wisdowm tooth extraction SocHx:   no Tobacco, no  EtOH, no Illicit Drugs  O: Vital to be taken on arrival Gen. AAOx3, NAD CV.  RRR  No murmur.  Resp. CTAB, no wheeze or crackles. Abd. Gravid,  no tenderness,  no rigidity,  no guarding Extr.  1+ pitting pedal edmea, no calf tenderness  FHT: 140 by doppler  Labs: see orders  A/P:  33 y.o. [id number] @ [redacted]w[redacted]d EGA who presents for repeat C-section due to breech presentation as well as desire for repeat and permanent  sterilization. -FWB:  Reassuring by doppler -NPO -IVF- LR @ 125cc/hr -Ancef 2g IV to OR -SCDs to OR -T&S, CBC to be collected  C-section and bilateral tubal ligation risk and benefits reviewed but not limited to bleeding, infection, injury to other organs, irreversibility and failure rate of 1/500-07/998. The absence of effect on future cycle, PMS and menopause also discussed. Questions and concerns addressed, inform consent obtained.  Myna Hidalgo, DO [phone] (pager) [phone] (office)

## 2014-04-03 ENCOUNTER — Encounter (HOSPITAL_COMMUNITY): Payer: Self-pay | Admitting: *Deleted

## 2014-04-04 ENCOUNTER — Encounter (HOSPITAL_COMMUNITY)
Admission: RE | Admit: 2014-04-04 | Discharge: 2014-04-04 | Disposition: A | Discharge status: Home / Self Care | Payer: BC Managed Care – PPO | Source: Ambulatory Visit | Attending: Obstetrics & Gynecology | Admitting: Obstetrics & Gynecology

## 2014-04-04 ENCOUNTER — Encounter (HOSPITAL_COMMUNITY): Payer: Self-pay

## 2014-04-04 LAB — RPR

## 2014-04-04 LAB — CBC
HEMATOCRIT: 38.4 % (ref 36.0–46.0)
Hemoglobin: 13.2 g/dL (ref 12.0–15.0)
MCH: 30.2 pg (ref 26.0–34.0)
MCHC: 34.4 g/dL (ref 30.0–36.0)
MCV: 87.9 fL (ref 78.0–100.0)
Platelets: 183 10*3/uL (ref 150–400)
RBC: 4.37 MIL/uL (ref 3.87–5.11)
RDW: 13.6 % (ref 11.5–15.5)
WBC: 10.9 10*3/uL — AB (ref 4.0–10.5)

## 2014-04-04 NOTE — Patient Instructions (Signed)
Your procedure is scheduled on: Saturday 04/07/14  Enter through the Main Entrance at : 0730am Pick up desk phone and dial [phone] and inform us of your arrival.  Please call [phone] if you have any problems the morning of surgery.  Remember: Do not eat food or drink liquids, including water, after midnight:Friday  You may brush your teeth the morning of surgery.  DO NOT wear jewelry, eye make-up, lipstick,body lotion, or dark fingernail polish.  (Polished toes are ok) You may wear deodorant.  If you are to be admitted after surgery, leave suitcase in car until your room has been assigned. Patients discharged on the day of surgery will not be allowed to drive home. Wear loose fitting, comfortable clothes for your ride home.

## 2014-04-05 NOTE — Pre-Procedure Instructions (Signed)
+   antibody screen from T&S done on 04/04/14- per lab [facility]) no other T&S necessary but lab needs date of RhoGam given in office. I checked prenatal records on EPIC but not found and Sangeeta notified to call office tomorrow (office now closed for the day)

## 2014-04-07 ENCOUNTER — Inpatient Hospital Stay (HOSPITAL_COMMUNITY)
Admission: RE | Admit: 2014-04-07 | Discharge: 2014-04-09 | DRG: 766 | Disposition: A | Discharge status: Home / Self Care | Payer: BC Managed Care – PPO | Source: Ambulatory Visit | Attending: Obstetrics & Gynecology | Admitting: Obstetrics & Gynecology

## 2014-04-07 ENCOUNTER — Encounter (HOSPITAL_COMMUNITY): Payer: BC Managed Care – PPO | Admitting: Anesthesiology

## 2014-04-07 ENCOUNTER — Encounter (HOSPITAL_COMMUNITY): Payer: Self-pay

## 2014-04-07 ENCOUNTER — Inpatient Hospital Stay (HOSPITAL_COMMUNITY): Payer: BC Managed Care – PPO | Admitting: Anesthesiology

## 2014-04-07 ENCOUNTER — Encounter (HOSPITAL_COMMUNITY)
Admission: RE | Disposition: A | Discharge status: Home / Self Care | Payer: Self-pay | Source: Ambulatory Visit | Attending: Obstetrics & Gynecology

## 2014-04-07 DIAGNOSIS — N200 Calculus of kidney: Secondary | ICD-10-CM

## 2014-04-07 DIAGNOSIS — O34219 Maternal care for unspecified type scar from previous cesarean delivery: Secondary | ICD-10-CM | POA: Diagnosis present

## 2014-04-07 DIAGNOSIS — O321XX0 Maternal care for breech presentation, not applicable or unspecified: Secondary | ICD-10-CM | POA: Diagnosis present

## 2014-04-07 DIAGNOSIS — Z302 Encounter for sterilization: Secondary | ICD-10-CM

## 2014-04-07 DIAGNOSIS — Z87442 Personal history of urinary calculi: Secondary | ICD-10-CM | POA: Diagnosis not present

## 2014-04-07 DIAGNOSIS — O4703 False labor before 37 completed weeks of gestation, third trimester: Secondary | ICD-10-CM

## 2014-04-07 DIAGNOSIS — Z98891 History of uterine scar from previous surgery: Secondary | ICD-10-CM

## 2014-04-07 DIAGNOSIS — O360990 Maternal care for other rhesus isoimmunization, unspecified trimester, not applicable or unspecified: Secondary | ICD-10-CM | POA: Diagnosis present

## 2014-04-07 SURGERY — Surgical Case
Anesthesia: Spinal

## 2014-04-07 MED ORDER — ZOLPIDEM TARTRATE 5 MG PO TABS
5.0000 mg | ORAL_TABLET | Freq: Every evening | ORAL | Status: DC | PRN
Start: 2014-04-07 — End: 2014-04-09

## 2014-04-07 MED ORDER — EPHEDRINE SULFATE 50 MG/ML IJ SOLN
INTRAMUSCULAR | Status: DC | PRN
  Administered 2014-04-07: 10 mg via INTRAVENOUS
  Administered 2014-04-07 (×2): 5 mg via INTRAVENOUS

## 2014-04-07 MED ORDER — LACTATED RINGERS IV SOLN
INTRAVENOUS | Status: DC
  Administered 2014-04-07: 125 mL/h via INTRAVENOUS
  Administered 2014-04-07 (×2): via INTRAVENOUS

## 2014-04-07 MED ORDER — OXYTOCIN 10 UNIT/ML IJ SOLN
40.0000 [IU] | INTRAVENOUS | Status: DC | PRN
  Administered 2014-04-07: 40 [IU] via INTRAVENOUS

## 2014-04-07 MED ORDER — WITCH HAZEL-GLYCERIN EX PADS: 1.0000 "application " | MEDICATED_PAD | CUTANEOUS | Status: DC | PRN

## 2014-04-07 MED ORDER — KETOROLAC TROMETHAMINE 30 MG/ML IJ SOLN: 30.0000 mg | Freq: Four times a day (QID) | INTRAMUSCULAR | Status: AC | PRN

## 2014-04-07 MED ORDER — CEFAZOLIN SODIUM-DEXTROSE 2-3 GM-% IV SOLR
2.0000 g | INTRAVENOUS | Status: AC
  Administered 2014-04-07: 2 g via INTRAVENOUS

## 2014-04-07 MED ORDER — TETANUS-DIPHTH-ACELL PERTUSSIS 5-2.5-18.5 LF-MCG/0.5 IM SUSP
0.5000 mL | Freq: Once | INTRAMUSCULAR | Status: DC
  Filled 2014-04-07: qty 0.5

## 2014-04-07 MED ORDER — ONDANSETRON HCL 4 MG PO TABS
4.0000 mg | ORAL_TABLET | ORAL | Status: DC | PRN
Start: 2014-04-07 — End: 2014-04-09

## 2014-04-07 MED ORDER — DIPHENHYDRAMINE HCL 25 MG PO CAPS: 25.0000 mg | ORAL_CAPSULE | Freq: Four times a day (QID) | ORAL | Status: DC | PRN

## 2014-04-07 MED ORDER — KETOROLAC TROMETHAMINE 30 MG/ML IJ SOLN
30.0000 mg | Freq: Four times a day (QID) | INTRAMUSCULAR | Status: AC | PRN
  Administered 2014-04-07: 30 mg via INTRAVENOUS

## 2014-04-07 MED ORDER — LACTATED RINGERS IV SOLN
INTRAVENOUS | Status: DC
  Administered 2014-04-08: 01:00:00 via INTRAVENOUS

## 2014-04-07 MED ORDER — MEPERIDINE HCL 25 MG/ML IJ SOLN: 6.2500 mg | INTRAMUSCULAR | Status: DC | PRN

## 2014-04-07 MED ORDER — DIBUCAINE 1 % RE OINT: 1.0000 "application " | TOPICAL_OINTMENT | RECTAL | Status: DC | PRN

## 2014-04-07 MED ORDER — MORPHINE SULFATE 0.5 MG/ML IJ SOLN
INTRAMUSCULAR | Status: AC
  Filled 2014-04-07: qty 10

## 2014-04-07 MED ORDER — SCOPOLAMINE 1 MG/3DAYS TD PT72: 1.0000 | MEDICATED_PATCH | Freq: Once | TRANSDERMAL | Status: DC

## 2014-04-07 MED ORDER — KETOROLAC TROMETHAMINE 30 MG/ML IJ SOLN: 15.0000 mg | Freq: Once | INTRAMUSCULAR | Status: DC | PRN

## 2014-04-07 MED ORDER — EPHEDRINE 5 MG/ML INJ
INTRAVENOUS | Status: AC
  Filled 2014-04-07: qty 10

## 2014-04-07 MED ORDER — OXYTOCIN 10 UNIT/ML IJ SOLN
INTRAMUSCULAR | Status: AC
  Filled 2014-04-07: qty 4

## 2014-04-07 MED ORDER — HYDROMORPHONE HCL 1 MG/ML IJ SOLN
INTRAMUSCULAR | Status: AC
  Filled 2014-04-07: qty 1

## 2014-04-07 MED ORDER — HYDROMORPHONE HCL 1 MG/ML IJ SOLN
0.2500 mg | INTRAMUSCULAR | Status: DC | PRN
  Administered 2014-04-07: 0.25 mg via INTRAVENOUS

## 2014-04-07 MED ORDER — SCOPOLAMINE 1 MG/3DAYS TD PT72
MEDICATED_PATCH | TRANSDERMAL | Status: AC
  Filled 2014-04-07: qty 1

## 2014-04-07 MED ORDER — ONDANSETRON HCL 4 MG/2ML IJ SOLN
INTRAMUSCULAR | Status: DC | PRN
  Administered 2014-04-07: 4 mg via INTRAVENOUS

## 2014-04-07 MED ORDER — ONDANSETRON HCL 4 MG/2ML IJ SOLN: 4.0000 mg | INTRAMUSCULAR | Status: DC | PRN

## 2014-04-07 MED ORDER — PHENYLEPHRINE 8 MG IN D5W 100 ML (0.08MG/ML) PREMIX OPTIME
INJECTION | INTRAVENOUS | Status: AC
  Filled 2014-04-07: qty 100

## 2014-04-07 MED ORDER — 0.9 % SODIUM CHLORIDE (POUR BTL) OPTIME
TOPICAL | Status: DC | PRN
  Administered 2014-04-07: 1000 mL

## 2014-04-07 MED ORDER — SODIUM CHLORIDE 0.9 % IJ SOLN: 3.0000 mL | INTRAMUSCULAR | Status: DC | PRN

## 2014-04-07 MED ORDER — SIMETHICONE 80 MG PO CHEW
80.0000 mg | CHEWABLE_TABLET | ORAL | Status: DC | PRN
  Filled 2014-04-07: qty 1

## 2014-04-07 MED ORDER — LACTATED RINGERS IV SOLN
Freq: Once | INTRAVENOUS | Status: AC
  Administered 2014-04-07: 1000 mL/h via INTRAVENOUS

## 2014-04-07 MED ORDER — DIPHENHYDRAMINE HCL 50 MG/ML IJ SOLN: 12.5000 mg | INTRAMUSCULAR | Status: DC | PRN

## 2014-04-07 MED ORDER — SIMETHICONE 80 MG PO CHEW
80.0000 mg | CHEWABLE_TABLET | ORAL | Status: DC
  Administered 2014-04-07 – 2014-04-09 (×2): 80 mg via ORAL
  Filled 2014-04-07 (×2): qty 1

## 2014-04-07 MED ORDER — CEFAZOLIN SODIUM-DEXTROSE 2-3 GM-% IV SOLR
INTRAVENOUS | Status: AC
  Filled 2014-04-07: qty 50

## 2014-04-07 MED ORDER — LANOLIN HYDROUS EX OINT: 1.0000 "application " | TOPICAL_OINTMENT | CUTANEOUS | Status: DC | PRN

## 2014-04-07 MED ORDER — SCOPOLAMINE 1 MG/3DAYS TD PT72
1.0000 | MEDICATED_PATCH | Freq: Once | TRANSDERMAL | Status: DC
  Administered 2014-04-07: 1.5 mg via TRANSDERMAL

## 2014-04-07 MED ORDER — FENTANYL CITRATE 0.05 MG/ML IJ SOLN
INTRAMUSCULAR | Status: DC | PRN
  Administered 2014-04-07: 12.5 ug via INTRAVENOUS
  Administered 2014-04-07: 12.5 ug via INTRATHECAL
  Administered 2014-04-07: 50 ug via INTRAVENOUS
  Administered 2014-04-07: 25 ug via INTRAVENOUS

## 2014-04-07 MED ORDER — NALOXONE HCL 0.4 MG/ML IJ SOLN: 0.4000 mg | INTRAMUSCULAR | Status: DC | PRN

## 2014-04-07 MED ORDER — FENTANYL CITRATE 0.05 MG/ML IJ SOLN
INTRAMUSCULAR | Status: AC
  Filled 2014-04-07: qty 2

## 2014-04-07 MED ORDER — MENTHOL 3 MG MT LOZG: 1.0000 | LOZENGE | OROMUCOSAL | Status: DC | PRN

## 2014-04-07 MED ORDER — SIMETHICONE 80 MG PO CHEW
80.0000 mg | CHEWABLE_TABLET | Freq: Three times a day (TID) | ORAL | Status: DC
  Administered 2014-04-07 – 2014-04-09 (×6): 80 mg via ORAL
  Filled 2014-04-07 (×6): qty 1

## 2014-04-07 MED ORDER — PHENYLEPHRINE 8 MG IN D5W 100 ML (0.08MG/ML) PREMIX OPTIME
INJECTION | INTRAVENOUS | Status: DC | PRN
  Administered 2014-04-07: 60 ug/min via INTRAVENOUS

## 2014-04-07 MED ORDER — DIPHENHYDRAMINE HCL 25 MG PO CAPS: 25.0000 mg | ORAL_CAPSULE | ORAL | Status: DC | PRN

## 2014-04-07 MED ORDER — PRENATAL MULTIVITAMIN CH
1.0000 | ORAL_TABLET | Freq: Every day | ORAL | Status: DC
  Administered 2014-04-08 – 2014-04-09 (×2): 1 via ORAL
  Filled 2014-04-07 (×2): qty 1

## 2014-04-07 MED ORDER — SENNOSIDES-DOCUSATE SODIUM 8.6-50 MG PO TABS
2.0000 | ORAL_TABLET | ORAL | Status: DC
  Administered 2014-04-07 – 2014-04-09 (×2): 2 via ORAL
  Filled 2014-04-07 (×2): qty 2

## 2014-04-07 MED ORDER — IBUPROFEN 600 MG PO TABS: 600.0000 mg | ORAL_TABLET | Freq: Four times a day (QID) | ORAL | Status: DC | PRN

## 2014-04-07 MED ORDER — ONDANSETRON HCL 4 MG/2ML IJ SOLN
INTRAMUSCULAR | Status: AC
  Filled 2014-04-07: qty 2

## 2014-04-07 MED ORDER — MORPHINE SULFATE (PF) 0.5 MG/ML IJ SOLN
INTRAMUSCULAR | Status: DC | PRN
  Administered 2014-04-07: .2 mg via INTRATHECAL

## 2014-04-07 MED ORDER — PROMETHAZINE HCL 25 MG/ML IJ SOLN: 6.2500 mg | INTRAMUSCULAR | Status: DC | PRN

## 2014-04-07 MED ORDER — OXYTOCIN 40 UNITS IN LACTATED RINGERS INFUSION - SIMPLE MED: 62.5000 mL/h | INTRAVENOUS | Status: AC

## 2014-04-07 MED ORDER — ONDANSETRON HCL 4 MG/2ML IJ SOLN: 4.0000 mg | Freq: Three times a day (TID) | INTRAMUSCULAR | Status: DC | PRN

## 2014-04-07 MED ORDER — NALOXONE HCL 1 MG/ML IJ SOLN
1.0000 ug/kg/h | INTRAMUSCULAR | Status: DC | PRN
  Filled 2014-04-07: qty 2

## 2014-04-07 MED ORDER — KETOROLAC TROMETHAMINE 30 MG/ML IJ SOLN
INTRAMUSCULAR | Status: AC
  Administered 2014-04-07: 30 mg via INTRAVENOUS
  Filled 2014-04-07: qty 1

## 2014-04-07 MED ORDER — BUPIVACAINE IN DEXTROSE 0.75-8.25 % IT SOLN
INTRATHECAL | Status: DC | PRN
  Administered 2014-04-07: 1.4 mL via INTRATHECAL

## 2014-04-07 MED ORDER — OXYCODONE-ACETAMINOPHEN 5-325 MG PO TABS: 2.0000 | ORAL_TABLET | ORAL | Status: DC | PRN

## 2014-04-07 MED ORDER — IBUPROFEN 600 MG PO TABS
600.0000 mg | ORAL_TABLET | Freq: Four times a day (QID) | ORAL | Status: DC
  Administered 2014-04-07 – 2014-04-09 (×7): 600 mg via ORAL
  Filled 2014-04-07 (×7): qty 1

## 2014-04-07 MED ORDER — OXYCODONE-ACETAMINOPHEN 5-325 MG PO TABS
1.0000 | ORAL_TABLET | ORAL | Status: DC | PRN
  Administered 2014-04-08 – 2014-04-09 (×4): 1 via ORAL
  Filled 2014-04-07 (×4): qty 1

## 2014-04-07 MED ORDER — INFLUENZA VAC SPLIT QUAD 0.5 ML IM SUSY
0.5000 mL | PREFILLED_SYRINGE | INTRAMUSCULAR | Status: AC
  Administered 2014-04-08: 0.5 mL via INTRAMUSCULAR
  Filled 2014-04-07: qty 0.5

## 2014-04-07 SURGICAL SUPPLY — 43 items
ADH SKN CLS APL DERMABOND .7 (GAUZE/BANDAGES/DRESSINGS)
APL SKNCLS STERI-STRIP NONHPOA (GAUZE/BANDAGES/DRESSINGS) ×1
BENZOIN TINCTURE PRP APPL 2/3 (GAUZE/BANDAGES/DRESSINGS) ×2 IMPLANT
CLAMP CORD UMBIL (MISCELLANEOUS) IMPLANT
CLOTH BEACON ORANGE TIMEOUT ST (SAFETY) ×2 IMPLANT
COVER LIGHT HANDLE  1/PK (MISCELLANEOUS) ×2
COVER LIGHT HANDLE 1/PK (MISCELLANEOUS) ×2 IMPLANT
DERMABOND ADVANCED (GAUZE/BANDAGES/DRESSINGS)
DERMABOND ADVANCED .7 DNX12 (GAUZE/BANDAGES/DRESSINGS) IMPLANT
DRAPE SHEET LG 3/4 BI-LAMINATE (DRAPES) IMPLANT
DRSG OPSITE POSTOP 4X10 (GAUZE/BANDAGES/DRESSINGS) ×2 IMPLANT
DURAPREP 26ML APPLICATOR (WOUND CARE) ×2 IMPLANT
ELECT REM PT RETURN 9FT ADLT (ELECTROSURGICAL) ×2
ELECTRODE REM PT RTRN 9FT ADLT (ELECTROSURGICAL) ×1 IMPLANT
EXTRACTOR VACUUM KIWI (MISCELLANEOUS) IMPLANT
GLOVE BIOGEL PI IND STRL 6.5 (GLOVE) ×1 IMPLANT
GLOVE BIOGEL PI INDICATOR 6.5 (GLOVE) ×1
GLOVE ECLIPSE 6.5 STRL STRAW (GLOVE) ×2 IMPLANT
GOWN STRL REUS W/TWL LRG LVL3 (GOWN DISPOSABLE) ×4 IMPLANT
KIT ABG SYR 3ML LUER SLIP (SYRINGE) IMPLANT
NDL HYPO 25X5/8 SAFETYGLIDE (NEEDLE) IMPLANT
NEEDLE HYPO 25X5/8 SAFETYGLIDE (NEEDLE) IMPLANT
NS IRRIG 1000ML POUR BTL (IV SOLUTION) ×2 IMPLANT
PACK C SECTION WH (CUSTOM PROCEDURE TRAY) ×2 IMPLANT
PAD ABD 7.5X8 STRL (GAUZE/BANDAGES/DRESSINGS) ×2 IMPLANT
PAD ABD 8X7 1/2 STERILE (GAUZE/BANDAGES/DRESSINGS) ×1 IMPLANT
PAD OB MATERNITY 4.3X12.25 (PERSONAL CARE ITEMS) ×2 IMPLANT
RTRCTR C-SECT PINK 25CM LRG (MISCELLANEOUS) ×2 IMPLANT
SPONGE GAUZE 4X4 12PLY STER LF (GAUZE/BANDAGES/DRESSINGS) ×1 IMPLANT
STRIP CLOSURE SKIN 1/2X4 (GAUZE/BANDAGES/DRESSINGS) ×2 IMPLANT
SUT PLAIN 0 NONE (SUTURE) ×3 IMPLANT
SUT PLAIN 2 0 (SUTURE) ×2
SUT PLAIN 2 0 XLH (SUTURE) IMPLANT
SUT PLAIN ABS 2-0 CT1 27XMFL (SUTURE) IMPLANT
SUT VIC AB 0 CT1 27 (SUTURE) ×4
SUT VIC AB 0 CT1 27XBRD ANBCTR (SUTURE) ×2 IMPLANT
SUT VIC AB 0 CTX 36 (SUTURE) ×6
SUT VIC AB 0 CTX36XBRD ANBCTRL (SUTURE) ×3 IMPLANT
SUT VIC AB 4-0 KS 27 (SUTURE) ×2 IMPLANT
TAPE CLOTH SURG 4X10 WHT LF (GAUZE/BANDAGES/DRESSINGS) ×1 IMPLANT
TOWEL OR 17X24 6PK STRL BLUE (TOWEL DISPOSABLE) ×2 IMPLANT
TRAY FOLEY CATH 14FR (SET/KITS/TRAYS/PACK) IMPLANT
WATER STERILE IRR 1000ML POUR (IV SOLUTION) ×2 IMPLANT

## 2014-04-07 NOTE — Op Note (Signed)
PreOp Diagnosis:  Prior C-section, desires repeat, Breech presentation, desire for permanent sterilization PostOp Diagnosis: same Procedure: Repeat LTCS and tubal ligation Surgeon: Dr. Myna Hidalgo Assistant: Dr. Gerald Leitz Anesthesia: spinal Complications: none EBL: 700cc UOP: 200cc Fluids: 3000cc  Findings: Normal uterus tubes and ovaries bilaterally.  Infant from frank breech presentation  PROCEDURE:  Informed consent was obtained from the patient with risks, benefits, complications, treatment options, and expected outcomes discussed with the patient.  The patient concurred with the proposed plan, giving informed consent with form signed.   The patient was taken to Operating Room, and identified with the procedure verified as C-Section Delivery with tubal ligation with Time Out. With induction of anesthesia, the patient was prepped and draped in the usual sterile fashion. A Pfannenstiel incision was made and carried down through the subcutaneous tissue to the fascia. The fascia was incised in the midline and extended transversely. The superior aspect of the fascial incision was grasped with Kochers elevated and the underlying muscle dissected off. The inferior aspect of the facial incision was in similar fashion, grasped elevated and rectus muscles dissected off. The peritoneum was identified and entered. Peritoneal incision was extended longitudinally.  The Alexis retractor was inserted.  The utero-vesical peritoneal reflection was identified and incised transversely with the [facility] scissors, the incision extended laterally, the bladder flap created digitally. A low transverse uterine incision was made.  The right foot was grasped and brought to the incision site, difficulty identifying the left foot was noted as the left hand was presented at the hysterotomy.  The left leg was rotated and brought to the incision site and delivered.  The sacrum was brought through the incision.  The infant was  rotated, left arm swept anteriorly and delivered.  The baby was then rotated 180 degrees and to the right arm delivered.  The head was flexed and delivered atraumatically. After the umbilical cord was clamped and cut cord blood was obtained for evaluation.   The placenta was removed intact and appeared normal. The uterine outline, tubes and ovaries appeared normal. The uterine incision was closed with running locked sutures of 0 Vicryl and a second layer of the same stitch was used in an imbricating fashion.  Excellent hemostasis was obtained.    Attention was turned to the fallopian tubes.  The left fallopian tube was identified and followed to its fimbriated end. A knuckle of tube was made on the left using a free tie and individual ties of 0 plain catgut suture. The knuckle of tube was then excised. Hemostasis was adequate. An identical procedure was carried out on the opposite side. Again hemostasis was adequate. Bleeding was noted from the proximal portion of the tube and an additional free tie was need to ensure hemostasis.  The pericolic gutters were then cleared of all clots and debris. The abdomen was then re-examined and excellent hemostasis was noted.  Surgicel was placed over the hysterotomy.  The fascia was closed using a running suture of 0 Vicryl. The subcutaneous layer was closed in a running fashion using 2-0 plain gut. The skin was closed with 4-0 vicryl in a subcuticular fashion.  Instrument, sponge, and needle counts were correct prior the abdominal closure and at the conclusion of the case. The patient was taken to recovery in stable condition.  Dr. Richardson Dopp was present to assist due to her prior abdominal surgery and concern for complications.  Myna Hidalgo, DO [phone] (pager) [phone] (office)

## 2014-04-07 NOTE — Interval H&P Note (Signed)
History and Physical Interval Note:  04/07/2014 8:39 AM  Meredith [name]  has presented today for surgery, with the diagnosis of Repeat C-Section  The various methods of treatment have been discussed with the patient and family. After consideration of risks, benefits and other options for treatment, the patient has consented to  Procedure(s): CESAREAN SECTION WITH BILATERAL TUBAL LIGATION (N/A) as a surgical intervention .  The patient's history has been reviewed, patient examined, no change in status, stable for surgery.  I have reviewed the patient's chart and labs.  Questions were answered to the patient's satisfaction.     Myna Hidalgo, M

## 2014-04-07 NOTE — Lactation Note (Signed)
This note was copied from the chart of Meredith [name]. Lactation Consultation Note  Patient Name: Meredith [name] [id number] Date: 04/07/2014 Reason for consult: Initial assessment;Other (Comment) (charting for exclusion)  Mom now states she wants to bottle/formula feed, per report of RN, Meredith [name]   Maternal Data Formula Feeding for Exclusion: Yes Reason for exclusion: Mother's choice to formula feed on admision Has patient been taught Hand Expression?: No (decided to bottle feed w/formula)  Feeding Feeding Type: Formula Nipple Type: Slow - flow  LATCH Score/Interventions                      Lactation Tools Discussed/Used     Consult Status Consult Status: Complete    Lynda Rainwater 04/07/2014, 9:02 PM

## 2014-04-07 NOTE — Anesthesia Procedure Notes (Signed)
Spinal  Patient location during procedure: OR Start time: 04/07/2014 11:24 AM End time: 04/07/2014 11:28 AM Staffing Anesthesiologist: Leilani Able Performed by: anesthesiologist  Preanesthetic Checklist Completed: patient identified, surgical consent, pre-op evaluation, timeout performed, IV checked, risks and benefits discussed and monitors and equipment checked Spinal Block Patient position: sitting Prep: site prepped and draped and DuraPrep Patient monitoring: heart rate, cardiac monitor, continuous pulse ox and blood pressure Approach: midline Location: L3-4 Injection technique: single-shot Needle Needle type: Pencan  Needle gauge: 24 G Needle length: 9 cm Needle insertion depth: 6 cm Assessment Sensory level: T4

## 2014-04-07 NOTE — Anesthesia Postprocedure Evaluation (Signed)
Anesthesia Post Note  Patient: Meredith [name]  Procedure(s) Performed: Procedure(s) (LRB): CESAREAN SECTION WITH BILATERAL TUBAL LIGATION (N/A)  Anesthesia type: Spinal  Patient location: PACU  Post pain: Pain level controlled  Post assessment: Post-op Vital signs reviewed  Last Vitals:  Filed Vitals:   04/07/14 1400  BP: 103/56  Pulse: 88  Temp:   Resp: 20    Post vital signs: Reviewed  Level of consciousness: awake  Complications: No apparent anesthesia complications

## 2014-04-07 NOTE — Transfer of Care (Signed)
Immediate Anesthesia Transfer of Care Note  Patient: Meredith [name]  Procedure(s) Performed: Procedure(s): CESAREAN SECTION WITH BILATERAL TUBAL LIGATION (N/A)  Patient Location: PACU  Anesthesia Type:Spinal  Level of Consciousness: awake and alert   Airway & Oxygen Therapy: Patient Spontanous Breathing  Post-op Assessment: Report given to PACU RN and Post -op Vital signs reviewed and stable  Post vital signs: Reviewed and stable  Complications: No apparent anesthesia complications

## 2014-04-07 NOTE — Anesthesia Preprocedure Evaluation (Signed)
Anesthesia Evaluation  Patient identified by MRN, date of birth, ID band Patient awake    Reviewed: Allergy & Precautions, H&P , NPO status , Patient's Chart, lab work & pertinent test results  Airway Mallampati: II TM Distance: >3 FB Neck ROM: full    Dental no notable dental hx.    Pulmonary neg pulmonary ROS,    Pulmonary exam normal       Cardiovascular negative cardio ROS      Neuro/Psych negative psych ROS   GI/Hepatic negative GI ROS, Neg liver ROS,   Endo/Other  negative endocrine ROS  Renal/GU negative Renal ROS     Musculoskeletal   Abdominal Normal abdominal exam  (+)   Peds  Hematology negative hematology ROS (+)   Anesthesia Other Findings   Reproductive/Obstetrics (+) Pregnancy                           Anesthesia Physical Anesthesia Plan  ASA: II  Anesthesia Plan: Spinal   Post-op Pain Management:    Induction:   Airway Management Planned:   Additional Equipment:   Intra-op Plan:   Post-operative Plan:   Informed Consent: I have reviewed the patients History and Physical, chart, labs and discussed the procedure including the risks, benefits and alternatives for the proposed anesthesia with the patient or authorized representative who has indicated his/her understanding and acceptance.     Plan Discussed with: Surgeon and CRNA  Anesthesia Plan Comments:         Anesthesia Quick Evaluation

## 2014-04-08 DIAGNOSIS — Z98891 History of uterine scar from previous surgery: Secondary | ICD-10-CM

## 2014-04-08 LAB — CBC
HCT: 34.1 % — ABNORMAL LOW (ref 36.0–46.0)
HEMOGLOBIN: 11.3 g/dL — AB (ref 12.0–15.0)
MCH: 29.3 pg (ref 26.0–34.0)
MCHC: 33.1 g/dL (ref 30.0–36.0)
MCV: 88.3 fL (ref 78.0–100.0)
Platelets: 148 10*3/uL — ABNORMAL LOW (ref 150–400)
RBC: 3.86 MIL/uL — AB (ref 3.87–5.11)
RDW: 13.9 % (ref 11.5–15.5)
WBC: 11.7 10*3/uL — ABNORMAL HIGH (ref 4.0–10.5)

## 2014-04-08 LAB — TYPE AND SCREEN
ABO/RH(D): O NEG
Antibody Screen: POSITIVE
DAT, IGG: NEGATIVE
UNIT DIVISION: 0
UNIT DIVISION: 0

## 2014-04-08 NOTE — Anesthesia Postprocedure Evaluation (Signed)
Anesthesia Post Note  Patient: Meredith [name]  Procedure(s) Performed: Procedure(s) (LRB): CESAREAN SECTION WITH BILATERAL TUBAL LIGATION (N/A)  Anesthesia type: Spinal  Patient location: Mother/Baby  Post pain: Pain level controlled  Post assessment: Post-op Vital signs reviewed  Last Vitals:  Filed Vitals:   04/08/14 0847  BP: 88/57  Pulse: 102  Temp: 37 C  Resp: 20    Post vital signs: Reviewed  Level of consciousness: awake  Complications: No apparent anesthesia complications

## 2014-04-08 NOTE — Addendum Note (Signed)
Addendum created 04/08/14 1045 by Algis Greenhouse, CRNA   Modules edited: Charges VN, Notes Section   Notes Section:  File: [id number]

## 2014-04-08 NOTE — Progress Notes (Signed)
Subjective: Postpartum Day 1: Cesarean Delivery Patient reports  Pain well controlled tolerating po   Objective: Vital signs in last 24 hours: Temp:  [98 F (36.7 C)-99 F (37.2 C)] 98.6 F (37 C) (09/27 0847) Pulse Rate:  [54-124] 102 (09/27 0847) Resp:  [16-28] 20 (09/27 0847) BP: (83-112)/(51-75) 88/57 mmHg (09/27 0847) SpO2:  [95 %-100 %] 98 % (09/27 0847) Weight:  [84.823 kg (187 lb)] 84.823 kg (187 lb) (09/26 1600)  Physical Exam:  General: alert and cooperative Lochia: appropriate Uterine Fundus: firm Incision: healing well DVT Evaluation: No evidence of DVT seen on physical exam.   Recent Labs  04/08/14 0608  HGB 11.3*  HCT 34.1*    Assessment/Plan: Status post Cesarean section. Doing well postoperatively.  Continue current care.  Jahron Hunsinger J. 04/08/2014, 10:50 AM

## 2014-04-09 ENCOUNTER — Encounter (HOSPITAL_COMMUNITY): Payer: Self-pay | Admitting: Obstetrics & Gynecology

## 2014-04-09 MED ORDER — OXYCODONE-ACETAMINOPHEN 5-325 MG PO TABS: 1.0000 | ORAL_TABLET | ORAL | Status: DC | PRN

## 2014-04-09 MED ORDER — IBUPROFEN 600 MG PO TABS: 600.0000 mg | ORAL_TABLET | Freq: Four times a day (QID) | ORAL | Status: DC | PRN

## 2014-04-09 NOTE — Discharge Summary (Signed)
Obstetric Discharge Summary Reason for Admission: cesarean section Prenatal Procedures: none Intrapartum Procedures: cesarean: low cervical, transverse and tubal ligation Postpartum Procedures: none Complications-Operative and Postpartum: none Hemoglobin  Date Value Ref Range Status  04/08/2014 11.3* 12.0 - 15.0 g/dL Final     HCT  Date Value Ref Range Status  04/08/2014 34.1* 36.0 - 46.0 % Final    Physical Exam:  General: alert and cooperative Lochia: appropriate Uterine Fundus: firm Incision: healing well DVT Evaluation: No evidence of DVT seen on physical exam.  Discharge Diagnoses: Term Pregnancy-delivered  Discharge Information: Date: 04/09/2014 Activity: pelvic rest Diet: routine Medications: PNV, Ibuprofen and Percocet Condition: stable Instructions: refer to practice specific booklet Discharge to: home Follow-up Information   Follow up with Myna Hidalgo, M, DO. Schedule an appointment as soon as possible for a visit in 2 weeks. (incision check ... pt may already have an appointment )    Specialty:  Obstetrics and Gynecology   Contact information:   [address] [place] [address] [phone]       Newborn Data: Live born female  Birth Weight: 8 lb 7.5 oz (3840 g) APGAR: 8, 9  Home with mother.  Kelli Egolf J. 04/09/2014, 12:41 PM

## 2014-04-09 NOTE — Progress Notes (Signed)
Ur chart review completed.  

## 2014-05-14 ENCOUNTER — Encounter (HOSPITAL_COMMUNITY): Payer: Self-pay | Admitting: Obstetrics & Gynecology

## 2014-06-18 ENCOUNTER — Encounter (HOSPITAL_COMMUNITY): Payer: Self-pay | Admitting: Obstetrics & Gynecology

## 2014-07-15 ENCOUNTER — Encounter (HOSPITAL_COMMUNITY): Payer: Self-pay | Admitting: Emergency Medicine

## 2014-07-15 ENCOUNTER — Emergency Department (HOSPITAL_COMMUNITY)
Admission: EM | Admit: 2014-07-15 | Discharge: 2014-07-16 | Disposition: A | Discharge status: Home / Self Care | Payer: Medicaid Other | Attending: Emergency Medicine | Admitting: Emergency Medicine

## 2014-07-15 DIAGNOSIS — Z8669 Personal history of other diseases of the nervous system and sense organs: Secondary | ICD-10-CM | POA: Insufficient documentation

## 2014-07-15 DIAGNOSIS — D72829 Elevated white blood cell count, unspecified: Secondary | ICD-10-CM | POA: Insufficient documentation

## 2014-07-15 DIAGNOSIS — R109 Unspecified abdominal pain: Secondary | ICD-10-CM | POA: Insufficient documentation

## 2014-07-15 DIAGNOSIS — R112 Nausea with vomiting, unspecified: Secondary | ICD-10-CM

## 2014-07-15 DIAGNOSIS — Z87442 Personal history of urinary calculi: Secondary | ICD-10-CM | POA: Insufficient documentation

## 2014-07-15 DIAGNOSIS — R197 Diarrhea, unspecified: Secondary | ICD-10-CM | POA: Insufficient documentation

## 2014-07-15 LAB — URINALYSIS, ROUTINE W REFLEX MICROSCOPIC
Glucose, UA: NEGATIVE mg/dL
Hgb urine dipstick: NEGATIVE
KETONES UR: NEGATIVE mg/dL
NITRITE: NEGATIVE
Protein, ur: NEGATIVE mg/dL
Specific Gravity, Urine: 1.025 (ref 1.005–1.030)
Urobilinogen, UA: 0.2 mg/dL (ref 0.0–1.0)
pH: 5 (ref 5.0–8.0)

## 2014-07-15 LAB — URINE MICROSCOPIC-ADD ON

## 2014-07-15 LAB — CBC WITH DIFFERENTIAL/PLATELET
Basophils Absolute: 0 10*3/uL (ref 0.0–0.1)
Basophils Relative: 0 % (ref 0–1)
EOS PCT: 1 % (ref 0–5)
Eosinophils Absolute: 0.2 10*3/uL (ref 0.0–0.7)
HEMATOCRIT: 44.9 % (ref 36.0–46.0)
Hemoglobin: 15.3 g/dL — ABNORMAL HIGH (ref 12.0–15.0)
LYMPHS PCT: 5 % — AB (ref 12–46)
Lymphs Abs: 0.7 10*3/uL (ref 0.7–4.0)
MCH: 30.1 pg (ref 26.0–34.0)
MCHC: 34.1 g/dL (ref 30.0–36.0)
MCV: 88.4 fL (ref 78.0–100.0)
MONO ABS: 1.2 10*3/uL — AB (ref 0.1–1.0)
Monocytes Relative: 8 % (ref 3–12)
Neutro Abs: 12.5 10*3/uL — ABNORMAL HIGH (ref 1.7–7.7)
Neutrophils Relative %: 86 % — ABNORMAL HIGH (ref 43–77)
Platelets: 226 10*3/uL (ref 150–400)
RBC: 5.08 MIL/uL (ref 3.87–5.11)
RDW: 13.2 % (ref 11.5–15.5)
WBC: 14.5 10*3/uL — ABNORMAL HIGH (ref 4.0–10.5)

## 2014-07-15 LAB — COMPREHENSIVE METABOLIC PANEL
ALT: 24 U/L (ref 0–35)
ANION GAP: 5 (ref 5–15)
AST: 21 U/L (ref 0–37)
Albumin: 4.9 g/dL (ref 3.5–5.2)
Alkaline Phosphatase: 76 U/L (ref 39–117)
BUN: 17 mg/dL (ref 6–23)
CO2: 25 mmol/L (ref 19–32)
CREATININE: 0.75 mg/dL (ref 0.50–1.10)
Calcium: 9.1 mg/dL (ref 8.4–10.5)
Chloride: 105 mEq/L (ref 96–112)
GFR calc Af Amer: >90 mL/min (ref >90)
GLUCOSE: 108 mg/dL — AB (ref 70–99)
Potassium: 3.6 mmol/L (ref 3.5–5.1)
Sodium: 135 mmol/L (ref 135–145)
Total Bilirubin: 0.8 mg/dL (ref 0.3–1.2)
Total Protein: 8.1 g/dL (ref 6.0–8.3)

## 2014-07-15 LAB — LIPASE, BLOOD: LIPASE: 24 U/L (ref 11–59)

## 2014-07-15 MED ORDER — SODIUM CHLORIDE 0.9 % IV BOLUS (SEPSIS)
1000.0000 mL | Freq: Once | INTRAVENOUS | Status: AC
  Administered 2014-07-15: 1000 mL via INTRAVENOUS

## 2014-07-15 MED ORDER — ONDANSETRON HCL 4 MG/2ML IJ SOLN
4.0000 mg | Freq: Once | INTRAMUSCULAR | Status: AC
  Administered 2014-07-15: 4 mg via INTRAVENOUS
  Filled 2014-07-15: qty 2

## 2014-07-15 MED ORDER — ONDANSETRON HCL 4 MG/2ML IJ SOLN: 4.0000 mg | Freq: Once | INTRAMUSCULAR | Status: DC

## 2014-07-15 NOTE — ED Notes (Signed)
Pt reports x4 episodes of emesis, and x2 episodes of diarrhea. Pt denies pain or GU complaints, but states previous symptoms were associated with UTI.

## 2014-07-15 NOTE — ED Provider Notes (Signed)
CSN: [id number]     Arrival date & time 07/15/14  2004 History   First MD Initiated Contact with Patient 07/15/14 2103     Chief Complaint  Patient presents with  . Emesis  . Diarrhea     Patient is a 34 y.o. female presenting with vomiting and diarrhea. The history is provided by the patient. No language interpreter was used.  Emesis Associated symptoms: diarrhea   Diarrhea Associated symptoms: vomiting    [name] presents for evaluation of vomiting and diarrhea. She developed multiple episodes of vomiting and diarrhea several hours prior to ED arrival. She had some mild lower abdominal discomfort after vomiting. She denies any fevers, dysuria. She has a history of discharge and kidney stones and needed surgery in the past. She comes in today because she has 3 kids at home to take care of and she doesn't want to get sick or dehydrated. She reports that one of her children this vomiting at home. Symptoms are moderate, constant, worsening.  Past Medical History  Diagnosis Date  . 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 Pyelonephritis 06/21/2011    10 kidney stones surgically removed   Past Surgical History  Procedure Laterality Date  . 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 Cesarean section  jan 2009  . Wisdom tooth extraction  2006  . Nephrostomy  08/03/11    left  . Nephrolithotomy  08/03/2011    Procedure: NEPHROLITHOTOMY PERCUTANEOUS;  Surgeon: Kathi Ludwig, MD;  Location: [facility];  Service: Urology;  Laterality: Left;  Toney Reil iud N/A 2009  . Cesarean section with bilateral tubal ligation N/A 04/07/2014    Procedure: CESAREAN SECTION WITH BILATERAL TUBAL LIGATION;  Surgeon: Sharon Seller, DO;  Location: [facility];  Service: Obstetrics;  Laterality: N/A;   Family History  Problem Relation Age of Onset  . Urolithiasis Mother   . Hypertension Maternal Grandmother   . Thyroid disease Maternal Grandmother   . Dementia Maternal Grandmother   . Heart  disease Maternal Grandfather   . Diabetes Maternal Grandfather    History  Substance Use Topics  . Smoking status: Never Smoker   . Smokeless tobacco: Never Used  . Alcohol Use: No     Comment: very occasional   OB History    Gravida Para Term Preterm AB TAB SAB Ectopic Multiple Living   0 0 0 0 0 0 3     Review of Systems  Gastrointestinal: Positive for vomiting and diarrhea.  All other systems reviewed and are negative.     Allergies  Morphine and related and Promethazine hcl  Home Medications   Prior to Admission medications   Medication Sig Start Date End Date Taking? Authorizing Provider  ibuprofen (ADVIL,MOTRIN) 600 MG tablet Take 1 tablet (600 mg total) by mouth every 6 (six) hours as needed. Patient not taking: Reported on 07/15/2014 04/09/14   Dorien Chihuahua. Richardson Dopp, MD  oxyCODONE-acetaminophen (PERCOCET/ROXICET) 5-325 MG per tablet Take 1-2 tablets by mouth every 4 (four) hours as needed (for pain scale less than 7). Patient not taking: Reported on 07/15/2014 04/09/14   Dorien Chihuahua. Richardson Dopp, MD   BP 113/79 mmHg  Pulse 111  Temp(Src) 98.2 F (36.8 C) (Oral)  Resp 16  Ht  (1.575 m)  Wt 160 lb (72.576 kg)  BMI 29.26 kg/m2  SpO2 98%  LMP 06/24/2014 (Approximate)  Breastfeeding? No Physical  Exam  Constitutional: She is oriented to person, place, and time. She appears well-developed and well-nourished.  HENT:  Head: Normocephalic and atraumatic.  Cardiovascular: Normal rate and regular rhythm.   No murmur heard. Pulmonary/Chest: Effort normal and breath sounds normal. No respiratory distress.  Abdominal: Soft. There is no tenderness. There is no rebound and no guarding.  Musculoskeletal: She exhibits no edema or tenderness.  Neurological: She is alert and oriented to person, place, and time.  Skin: Skin is warm and dry.  Psychiatric: She has a normal mood and affect. Her behavior is normal.  Nursing note and vitals reviewed.   ED Course  Procedures (including  critical care time) Labs Review Labs Reviewed  CBC WITH DIFFERENTIAL - Abnormal; Notable for the following:    WBC 14.5 (*)    Hemoglobin 15.3 (*)    Neutrophils Relative % 86 (*)    Neutro Abs 12.5 (*)    Lymphocytes Relative 5 (*)    Monocytes Absolute 1.2 (*)    All other components within normal limits  COMPREHENSIVE METABOLIC PANEL - Abnormal; Notable for the following:    Glucose, Bld 108 (*)    All other components within normal limits  URINALYSIS, ROUTINE W REFLEX MICROSCOPIC - Abnormal; Notable for the following:    APPearance CLOUDY (*)    Bilirubin Urine SMALL (*)    Leukocytes, UA TRACE (*)    All other components within normal limits  URINE MICROSCOPIC-ADD ON - Abnormal; Notable for the following:    Squamous Epithelial / LPF MANY (*)    Bacteria, UA FEW (*)    All other components within normal limits  URINE CULTURE  LIPASE, BLOOD  URINALYSIS, ROUTINE W REFLEX MICROSCOPIC    Imaging Review No results found.   EKG Interpretation None      MDM   Final diagnoses:  Nausea vomiting and diarrhea    Patient here for vomiting and diarrhea, CBC with leukocytosis. On repeat abdominal exam patient with minimal lower abdominal tenderness, clinical picture is not consistent with acute appendicitis. Initial UA is significant for UTI given many epithelial cells present, we are checking repeat urinalysis since patient has history of severe urinary tract infection with kidney stones in the past. Patient states that that illness presented asymptomatic until she was quite sick. Discussed with patient's oral fluid hydration, providing prescriptions for Zofran and Reglan. Discussed with patient need for repeat abdominal exam in the next 12-24 hours if she develops recurrent abdominal pain or has intractable vomiting. Return per cautioned were discussed  Tilden Fossa, MD 07/16/14 1445

## 2014-07-15 NOTE — ED Notes (Signed)
Pt given sprite for fluid challenge.  Tolerating w/o difficulty at this time.  Will continue to assess that pt is able to keep fluid down.

## 2014-07-16 LAB — URINALYSIS, ROUTINE W REFLEX MICROSCOPIC
Bilirubin Urine: NEGATIVE
GLUCOSE, UA: NEGATIVE mg/dL
Hgb urine dipstick: NEGATIVE
Ketones, ur: NEGATIVE mg/dL
Leukocytes, UA: NEGATIVE
Nitrite: NEGATIVE
Protein, ur: NEGATIVE mg/dL
SPECIFIC GRAVITY, URINE: 1.021 (ref 1.005–1.030)
UROBILINOGEN UA: 0.2 mg/dL (ref 0.0–1.0)
pH: 5.5 (ref 5.0–8.0)

## 2014-07-16 MED ORDER — ONDANSETRON HCL 4 MG PO TABS: 4.0000 mg | ORAL_TABLET | Freq: Four times a day (QID) | ORAL | Status: DC

## 2014-07-16 MED ORDER — METOCLOPRAMIDE HCL 10 MG PO TABS: 10.0000 mg | ORAL_TABLET | Freq: Four times a day (QID) | ORAL | Status: DC | PRN

## 2014-07-16 NOTE — ED Notes (Signed)

## 2014-07-16 NOTE — Discharge Instructions (Signed)
Get rechecked tomorrow if you develop abdominal pain or if you continue to have vomiting.    Viral Gastroenteritis Viral gastroenteritis is also known as stomach flu. This condition affects the stomach and intestinal tract. It can cause sudden diarrhea and vomiting. The illness typically lasts 3 to 8 days. Most people develop an immune response that eventually gets rid of the virus. While this natural response develops, the virus can make you quite ill. CAUSES  Many different viruses can cause gastroenteritis, such as rotavirus or noroviruses. You can catch one of these viruses by consuming contaminated food or water. You may also catch a virus by sharing utensils or other personal items with an infected person or by touching a contaminated surface. SYMPTOMS  The most common symptoms are diarrhea and vomiting. These problems can cause a severe loss of body fluids (dehydration) and a body salt (electrolyte) imbalance. Other symptoms may include:  Fever.  Headache.  Fatigue.  Abdominal pain. DIAGNOSIS  Your caregiver can usually diagnose viral gastroenteritis based on your symptoms and a physical exam. A stool sample may also be taken to test for the presence of viruses or other infections. TREATMENT  This illness typically goes away on its own. Treatments are aimed at rehydration. The most serious cases of viral gastroenteritis involve vomiting so severely that you are not able to keep fluids down. In these cases, fluids must be given through an intravenous line (IV). HOME CARE INSTRUCTIONS   Drink enough fluids to keep your urine clear or pale yellow. Drink small amounts of fluids frequently and increase the amounts as tolerated.  Ask your caregiver for specific rehydration instructions.  Avoid:  Foods high in sugar.  Alcohol.  Carbonated drinks.  Tobacco.  Juice.  Caffeine drinks.  Extremely hot or cold fluids.  Fatty, greasy foods.  Too much intake of anything at one  time.  Dairy products until 24 to 48 hours after diarrhea stops.  You may consume probiotics. Probiotics are active cultures of beneficial bacteria. They may lessen the amount and number of diarrheal stools in adults. Probiotics can be found in yogurt with active cultures and in supplements.  Wash your hands well to avoid spreading the virus.  Only take over-the-counter or prescription medicines for pain, discomfort, or fever as directed by your caregiver. Do not give aspirin to children. Antidiarrheal medicines are not recommended.  Ask your caregiver if you should continue to take your regular prescribed and over-the-counter medicines.  Keep all follow-up appointments as directed by your caregiver. SEEK IMMEDIATE MEDICAL CARE IF:   You are unable to keep fluids down.  You do not urinate at least once every 6 to 8 hours.  You develop shortness of breath.  You notice blood in your stool or vomit. This may look like coffee grounds.  You have abdominal pain that increases or is concentrated in one small area (localized).  You have persistent vomiting or diarrhea.  You have a fever.  The patient is a child younger than 3 months, and he or she has a fever.  The patient is a child older than 3 months, and he or she has a fever and persistent symptoms.  The patient is a child older than 3 months, and he or she has a fever and symptoms suddenly get worse.  The patient is a baby, and he or she has no tears when crying. MAKE SURE YOU:   Understand these instructions.  Will watch your condition.  Will get help right away  if you are not doing well or get worse. Document Released: 06/29/2005 Document Revised: 09/21/2011 Document Reviewed: 04/15/2011 [facility] Patient Information 2015 [place], [place]. This information is not intended to replace advice given to you by your health care provider. Make sure you discuss any questions you have with your health care provider.

## 2014-07-18 LAB — URINE CULTURE: Colony Count: >=100000

## 2014-07-19 ENCOUNTER — Telehealth (HOSPITAL_BASED_OUTPATIENT_CLINIC_OR_DEPARTMENT_OTHER): Payer: Self-pay | Admitting: Emergency Medicine

## 2014-07-19 NOTE — Progress Notes (Signed)
ED Antimicrobial Stewardship Positive Culture Follow Up   Meredith [name] is an 34 y.o. female who presented to [facility] on 07/15/2014 with a chief complaint of vomiting/diarrhea  Chief Complaint  Patient presents with  . Emesis  . Diarrhea    Recent Results (from the past 720 hour(s))  Urine culture     Status: None   Collection Time: 07/15/14 11:59 PM  Result Value Ref Range Status   Specimen Description URINE, CLEAN CATCH  Final   Special Requests NONE  Final   Colony Count   Final    >=100,000 COLONIES/ML Performed at [facility]    Culture   Final    ESCHERICHIA COLI Performed at [facility]    Report Status 07/18/2014 FINAL  Final   Organism ID, Bacteria ESCHERICHIA COLI  Final      Susceptibility   Escherichia coli - MIC*    AMPICILLIN <=2 SENSITIVE Sensitive     CEFAZOLIN <=4 SENSITIVE Sensitive     CEFTRIAXONE <=1 SENSITIVE Sensitive     CIPROFLOXACIN <=0.25 SENSITIVE Sensitive     GENTAMICIN <=1 SENSITIVE Sensitive     LEVOFLOXACIN <=0.12 SENSITIVE Sensitive     NITROFURANTOIN <=16 SENSITIVE Sensitive     TOBRAMYCIN <=1 SENSITIVE Sensitive     TRIMETH/SULFA <=20 SENSITIVE Sensitive     PIP/TAZO <=4 SENSITIVE Sensitive     * ESCHERICHIA COLI    [x]  Needs additional follow-up and symptom check  33 YOF who presented with vomiting/diarrhea. Noted slightly elevated temp/WBC however UA negative for infection but grew out E.coli which is likely a colonizer.  New antibiotic prescription: Call the patient for a symptom check. If asymptomatic >> no treatment needed. If symptomatic >> treat with Bactrim DS 1 tab bid x 3 days.   ED Provider:  Arthor CaptainAbigail Harris, PA-C  Rolley SimsMartin, Anjeli Casad Ann 07/19/2014, 3:15 PM Infectious Diseases Pharmacist Phone# [phone]

## 2014-07-20 ENCOUNTER — Telehealth (HOSPITAL_COMMUNITY): Payer: Self-pay

## 2014-07-20 NOTE — Telephone Encounter (Signed)
Pt informed of (+) URNC result and need for tx.  Rx for Bactrim DS called in and given to RPh at [facility] [phone].

## 2016-11-27 ENCOUNTER — Other Ambulatory Visit: Payer: Self-pay | Admitting: Obstetrics & Gynecology

## 2016-11-27 ENCOUNTER — Other Ambulatory Visit (HOSPITAL_COMMUNITY)
Admission: RE | Admit: 2016-11-27 | Discharge: 2016-11-27 | Disposition: A | Discharge status: Home / Self Care | Payer: Medicaid Other | Source: Ambulatory Visit | Attending: Obstetrics & Gynecology | Admitting: Obstetrics & Gynecology

## 2016-11-27 DIAGNOSIS — Z124 Encounter for screening for malignant neoplasm of cervix: Secondary | ICD-10-CM | POA: Diagnosis not present

## 2016-12-01 LAB — CYTOLOGY - PAP
Chlamydia: NEGATIVE
Diagnosis: NEGATIVE
HPV: NOT DETECTED
Neisseria Gonorrhea: NEGATIVE

## 2017-09-11 DIAGNOSIS — J029 Acute pharyngitis, unspecified: Secondary | ICD-10-CM | POA: Diagnosis not present

## 2018-05-02 DIAGNOSIS — Z23 Encounter for immunization: Secondary | ICD-10-CM | POA: Diagnosis not present

## 2018-08-22 DIAGNOSIS — J209 Acute bronchitis, unspecified: Secondary | ICD-10-CM | POA: Diagnosis not present

## 2018-08-22 DIAGNOSIS — J111 Influenza due to unidentified influenza virus with other respiratory manifestations: Secondary | ICD-10-CM | POA: Diagnosis not present

## 2018-10-14 DIAGNOSIS — R5382 Chronic fatigue, unspecified: Secondary | ICD-10-CM | POA: Diagnosis not present

## 2018-10-14 DIAGNOSIS — N921 Excessive and frequent menstruation with irregular cycle: Secondary | ICD-10-CM | POA: Diagnosis not present

## 2018-10-14 DIAGNOSIS — D500 Iron deficiency anemia secondary to blood loss (chronic): Secondary | ICD-10-CM | POA: Diagnosis not present

## 2018-10-19 ENCOUNTER — Ambulatory Visit (HOSPITAL_COMMUNITY)
Admission: RE | Admit: 2018-10-19 | Discharge: 2018-10-19 | Disposition: A | Discharge status: Home / Self Care | Payer: 59 | Source: Ambulatory Visit | Attending: Physician Assistant | Admitting: Physician Assistant

## 2018-10-19 ENCOUNTER — Other Ambulatory Visit: Payer: Self-pay

## 2018-10-19 DIAGNOSIS — D509 Iron deficiency anemia, unspecified: Secondary | ICD-10-CM | POA: Diagnosis not present

## 2018-10-19 MED ORDER — SODIUM CHLORIDE 0.9 % IV SOLN
INTRAVENOUS | Status: DC | PRN
  Administered 2018-10-19: 09:00:00 250 mL via INTRAVENOUS

## 2018-10-19 MED ORDER — FERUMOXYTOL INJECTION 510 MG/17 ML
510.0000 mg | Freq: Once | INTRAVENOUS | Status: AC
  Administered 2018-10-19: 510 mg via INTRAVENOUS
  Filled 2018-10-19: qty 17

## 2018-10-19 NOTE — Discharge Instructions (Signed)

## 2018-10-19 NOTE — Progress Notes (Signed)
PATIENT CARE CENTER NOTE  Diagnosis: Iron Deficiency Anemia    Provider: Ernst Bowler, PA-C   Procedure: IV Feraheme    Note: Patient received Feraheme infusion. Tolerated well with no adverse reaction. Observed patient for 30 minutes post-infusion. Vital signs stable. Discharge instructions given. Patient to come back next week for second infusion.  Alert, oriented and ambulatory at discharge.

## 2018-10-26 ENCOUNTER — Ambulatory Visit (HOSPITAL_COMMUNITY)
Admission: RE | Admit: 2018-10-26 | Discharge: 2018-10-26 | Disposition: A | Discharge status: Home / Self Care | Payer: 59 | Source: Ambulatory Visit | Attending: Physician Assistant | Admitting: Physician Assistant

## 2018-10-26 ENCOUNTER — Other Ambulatory Visit: Payer: Self-pay

## 2018-10-26 DIAGNOSIS — D509 Iron deficiency anemia, unspecified: Secondary | ICD-10-CM | POA: Diagnosis not present

## 2018-10-26 MED ORDER — FERUMOXYTOL INJECTION 510 MG/17 ML
510.0000 mg | Freq: Once | INTRAVENOUS | Status: AC
  Administered 2018-10-26: 510 mg via INTRAVENOUS
  Filled 2018-10-26: qty 17

## 2018-10-26 MED ORDER — SODIUM CHLORIDE 0.9 % IV SOLN
INTRAVENOUS | Status: DC | PRN
  Administered 2018-10-26: 250 mL via INTRAVENOUS

## 2018-10-26 NOTE — Discharge Instructions (Signed)

## 2018-10-26 NOTE — Progress Notes (Signed)
PATIENT CARE CENTER NOTE  Diagnosis: Iron Deficiency Anemia    Provider: Ernst Bowler, PA-C   Procedure: IV Feraheme    Note: Patient received Feraheme infusion. Tolerated well with no adverse reaction. Observed patient for 30 minutes post-infusion. Vital signs stable. Discharge instructions given.  Alert, oriented and ambulatory at discharge

## 2018-10-28 DIAGNOSIS — D500 Iron deficiency anemia secondary to blood loss (chronic): Secondary | ICD-10-CM | POA: Diagnosis not present

## 2018-10-28 DIAGNOSIS — N921 Excessive and frequent menstruation with irregular cycle: Secondary | ICD-10-CM | POA: Diagnosis not present

## 2018-10-28 DIAGNOSIS — R5382 Chronic fatigue, unspecified: Secondary | ICD-10-CM | POA: Diagnosis not present

## 2018-11-11 DIAGNOSIS — D500 Iron deficiency anemia secondary to blood loss (chronic): Secondary | ICD-10-CM | POA: Diagnosis not present

## 2018-11-17 DIAGNOSIS — D509 Iron deficiency anemia, unspecified: Secondary | ICD-10-CM | POA: Diagnosis not present

## 2018-12-20 ENCOUNTER — Telehealth: Payer: Self-pay | Admitting: Hematology and Oncology

## 2018-12-20 NOTE — Telephone Encounter (Signed)
A new hem appt has been scheduled for the pt to see Dr. Lindi Adie on 6/11 at 1pm. Pt aware to arrive 20 minutes early.

## 2018-12-21 NOTE — Progress Notes (Signed)
[facility] CONSULT NOTE  Patient Care Team: Osborn Cohooberts, Angela, MD as PCP - General (Obstetrics and Gynecology)  CHIEF COMPLAINTS/PURPOSE OF CONSULTATION: History of iron deficiency anemia  HISTORY OF PRESENTING ILLNESS:  Meredith [name] 38 y.o. female is here because of a history of iron deficiency anemia previously treated with IV iron. On 10/19/18 and 10/26/18 she received 2 doses of IV Feraheme. She presents to the clinic today for initial evaluation.  In spite of the improvement in the hemoglobin her iron stores with ferritin appear to be still very low at 6.  Because of this she was referred to us for further evaluation. Patient feels extremely fatigued and cannot seem to function at her work.  She feels lightheaded and dizzy and has trouble concentrating.  She tells me that her grandmother was diagnosed with iron malabsorption received IV iron.  She has been on oral iron therapy for an extremely long time.  It has not helped her significantly.  I reviewed her records extensively and collaborated the history with the patient.   MEDICAL HISTORY:  Past Medical History:  Diagnosis Date  . Pyelonephritis 06/21/2011   10 kidney stones surgically removed  . 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 CESAREAN SECTION  jan 2009  . CESAREAN SECTION WITH BILATERAL TUBAL LIGATION N/A 04/07/2014   Procedure: CESAREAN SECTION WITH BILATERAL TUBAL LIGATION;  Surgeon: Sharon SellerJennifer M Ozan, DO;  Location: [facility];  Service: Obstetrics;  Laterality: N/A;  . mirena iud N/A 2009  . NEPHROLITHOTOMY  08/03/2011   Procedure: NEPHROLITHOTOMY PERCUTANEOUS;  Surgeon: Kathi LudwigSigmund I Tannenbaum, MD;  Location: [facility];  Service: Urology;  Laterality: Left;  . NEPHROSTOMY  08/03/11   left  . 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 WISDOM TOOTH EXTRACTION  2006    SOCIAL HISTORY: Social History    Socioeconomic History  . Marital status: Married    Spouse name: Meredith [name]  . Number of children: 2  . Years of education: Not on file  . Highest education level: Not on file  Occupational History  . Occupation: Consulting civil engineerstudent    Comment: [employer]  Social Needs  . Financial resource strain: Not on file  . Food insecurity    Worry: Not on file    Inability: Not on file  . Transportation needs    Medical: Not on file    Non-medical: Not on file  Tobacco Use  . Smoking status: Never Smoker  . Smokeless tobacco: Never Used  Substance and Sexual Activity  . Alcohol use: No    Comment: very occasional  . Drug use: No  . Sexual activity: Yes    Partners: Male    Birth control/protection: I.U.D.  Lifestyle  . Physical activity    Days per week: Not on file    Minutes per session: Not on file  . Stress: Not on file  Relationships  . Social Musicianconnections    Talks on phone: Not on file    Gets together: Not on file    Attends religious service: Not on file    Active member of club or organization: Not on file    Attends meetings of clubs or organizations: Not on file    Relationship status: Not on file  . Intimate partner violence    Fear of current or ex partner: Not on  file    Emotionally abused: Not on file    Physically abused: Not on file    Forced sexual activity: Not on file  Other Topics Concern  . Not on file  Social History Narrative  . Not on file    FAMILY HISTORY: Family History  Problem Relation Age of Onset  . Urolithiasis Mother   . Hypertension Maternal Grandmother   . Thyroid disease Maternal Grandmother   . Dementia Maternal Grandmother   . Heart disease Maternal Grandfather   . Diabetes Maternal Grandfather     ALLERGIES:  is allergic to morphine and related and promethazine hcl.  MEDICATIONS:  Current Outpatient Medications  Medication Sig Dispense Refill  . metoCLOPramide (REGLAN) 10 MG tablet Take 1 tablet (10 mg total) by mouth every 6  (six) hours as needed for nausea. 10 tablet 0  . ondansetron (ZOFRAN) 4 MG tablet Take 1 tablet (4 mg total) by mouth every 6 (six) hours. 12 tablet 0   No current facility-administered medications for this visit.     REVIEW OF SYSTEMS:   Constitutional: Denies fevers, chills or abnormal night sweats Eyes: Denies blurriness of vision, double vision or watery eyes Ears, nose, mouth, throat, and face: Denies mucositis or sore throat Respiratory: Denies cough, dyspnea or wheezes Cardiovascular: Denies palpitation, chest discomfort or lower extremity swelling Gastrointestinal:  Denies nausea, heartburn or change in bowel habits Skin: Denies abnormal skin rashes Lymphatics: Denies new lymphadenopathy or easy bruising Neurological:Denies numbness, tingling or new weaknesses Behavioral/Psych: Mood is stable, no new changes  Breast: Denies any palpable lumps or discharge All other systems were reviewed with the patient and are negative.  PHYSICAL EXAMINATION: ECOG PERFORMANCE STATUS: 1 - Symptomatic but completely ambulatory  Vitals:   12/22/18 1318  BP: 104/74  Pulse: 86  Resp: 18  Temp: 98.9 F (37.2 C)  SpO2: 99%   Filed Weights   12/22/18 1318  Weight: 161 lb 1.6 oz (73.1 kg)    Physical exam not done due to COVID-19 precautions LABORATORY DATA:  I have reviewed the data as listed Lab Results  Component Value Date   WBC 14.5 (H) 07/15/2014   HGB 15.3 (H) 07/15/2014   HCT 44.9 07/15/2014   MCV 88.4 07/15/2014   PLT 226 07/15/2014   Lab Results  Component Value Date   NA 135 07/15/2014   K 3.6 07/15/2014   CL 105 07/15/2014   CO2 25 07/15/2014    RADIOGRAPHIC STUDIES: I have personally reviewed the radiological reports and agreed with the findings in the report.  ASSESSMENT AND PLAN:  Iron deficiency anemia 12/14/2018 labs: Hemoglobin 12.9, MCV 82.1, RDW 29.3, platelets 253, Ferritin: 6.7, iron saturation 14%, TIBC 433 11/17/2018: Hemoglobin 12.8, MCV 78, RDW 36.5,  reticulocyte count 0.8% 11/11/2018: Hemoglobin 11.8, MCV 76.3, RDW 38 platelets 219 10/15/2018: Hemoglobin 6.9, MCV 59, RDW 19.7, ferritin 2, iron saturation 2%, TIBC 490  Differential diagnosis: Blood loss versus malabsorption I discussed with the patient the pathway of iron absorption and its physiology. Currently the hemoglobin appears to be in good shape with iron saturation of 14% however the ferritin is still low at 6.7.  Currently GYN does not think there is any excessive bleeding.  She has not had any GI bleeding.  Although she has not seen a gastroenterologist.  She also does not have any signs or symptoms of celiac disease.  I explained to the patient that the first dose of IV iron was enough to support blood production.  Her  iron stores are still extremely low and therefore I recommend giving 2 more doses of IV iron 1 week apart starting next week.  We will recheck her labs in 3 months and follow-up with a video visit.   All questions were answered. The patient knows to call the clinic with any problems, questions or concerns.   Sabas SousVinay K Mikayla Chiusano, MD 12/22/2018   I, Molly Dorshimer, am acting as scribe for Serena CroissantVinay Shenia Alan, MD.  I have reviewed the above documentation for accuracy and completeness, and I agree with the above.

## 2018-12-22 ENCOUNTER — Inpatient Hospital Stay: Payer: Medicaid Other | Attending: Hematology and Oncology | Admitting: Hematology and Oncology

## 2018-12-22 ENCOUNTER — Other Ambulatory Visit: Payer: Self-pay

## 2018-12-22 DIAGNOSIS — Z79899 Other long term (current) drug therapy: Secondary | ICD-10-CM | POA: Diagnosis not present

## 2018-12-22 DIAGNOSIS — D509 Iron deficiency anemia, unspecified: Secondary | ICD-10-CM | POA: Diagnosis not present

## 2018-12-22 DIAGNOSIS — G4089 Other seizures: Secondary | ICD-10-CM | POA: Insufficient documentation

## 2018-12-22 NOTE — Assessment & Plan Note (Signed)
12/14/2018 labs: Hemoglobin 12.9, MCV 82.1, RDW 29.3, platelets 253, Ferritin: 6.7, iron saturation 14%, TIBC 433 11/17/2018: Hemoglobin 12.8, MCV 78, RDW 36.5, reticulocyte count 0.8% 11/11/2018: Hemoglobin 11.8, MCV 76.3, RDW 38 platelets 219 10/15/2018: Hemoglobin 6.9, MCV 59, RDW 19.7, ferritin 2, iron saturation 2%, TIBC 490  Differential diagnosis: Blood loss versus malabsorption I discussed with the patient the pathway of iron absorption and its physiology. Currently the hemoglobin appears to be in good shape with iron saturation of 14% however the ferritin is still low at 6.7.  I discussed with her that we need to evaluate if there is any blood loss issue.  Her menstrual cycle apparently used to be heavier but now has normalized and does not think that is causing the anemia.  She will need to see gastroenterology to evaluate for GI blood loss.  Because of this I recommended the patient take oral iron supplementation. We will recheck her labs in 3 months and follow-up.

## 2018-12-23 ENCOUNTER — Telehealth: Payer: Self-pay | Admitting: Hematology and Oncology

## 2018-12-23 NOTE — Telephone Encounter (Signed)
I LEFT  A message regarding schedule

## 2018-12-26 ENCOUNTER — Telehealth: Payer: Self-pay | Admitting: Hematology and Oncology

## 2018-12-26 NOTE — Progress Notes (Signed)
RN left voicemail for return call.   

## 2018-12-26 NOTE — Telephone Encounter (Signed)
Called pt  Per 6/15 sch message - unable to reach pt - left message for patient to call back for r/s

## 2018-12-27 ENCOUNTER — Telehealth: Payer: Self-pay

## 2018-12-27 NOTE — Telephone Encounter (Signed)
RN placed several calls to update on appointment information.    Per managed care, still awaiting authorization from insurance for iron infusion.  RN left voicemail requesting return call, and notify appointment will be canceled for 6/17.

## 2018-12-28 ENCOUNTER — Telehealth: Payer: Self-pay

## 2018-12-28 ENCOUNTER — Inpatient Hospital Stay: Payer: Medicaid Other

## 2018-12-28 NOTE — Telephone Encounter (Signed)
Received message from prior authorization staff reporting that per [employer] patient does not have active coverage at this time.    RN left voicemail for patient to return call to obtain any additional insurance information.

## 2018-12-30 ENCOUNTER — Telehealth: Payer: Self-pay | Admitting: Hematology and Oncology

## 2018-12-30 NOTE — Telephone Encounter (Signed)
Left message re 7/1 appointment. Patient to get updated schedule at 6/24 visit.

## 2019-01-02 ENCOUNTER — Telehealth: Payer: Self-pay | Admitting: *Deleted

## 2019-01-02 NOTE — Telephone Encounter (Signed)
Attempt x1 to contact pt regarding her insurance to see if she has any additional coverage.  LVM

## 2019-01-03 ENCOUNTER — Telehealth: Payer: Self-pay | Admitting: Hematology and Oncology

## 2019-01-03 ENCOUNTER — Telehealth: Payer: Self-pay | Admitting: *Deleted

## 2019-01-03 NOTE — Telephone Encounter (Signed)
Called pt per 6/23 sch message - unable to reach pt . Left message to pt to call back for reschedule.

## 2019-01-03 NOTE — Telephone Encounter (Signed)
Attempt x1 to contact pt regarding apt tomorrow 01/04/2019 and to verify her insurance to see if she has any additional coverage.  LVM

## 2019-01-04 ENCOUNTER — Inpatient Hospital Stay: Payer: Self-pay

## 2019-01-11 ENCOUNTER — Inpatient Hospital Stay: Payer: Medicaid Other | Attending: Hematology and Oncology

## 2019-01-11 ENCOUNTER — Other Ambulatory Visit: Payer: Self-pay

## 2019-01-11 VITALS — BP 105/74 | HR 74 | Temp 98.7°F | Resp 20

## 2019-01-11 DIAGNOSIS — M79606 Pain in leg, unspecified: Secondary | ICD-10-CM | POA: Diagnosis not present

## 2019-01-11 DIAGNOSIS — Z79899 Other long term (current) drug therapy: Secondary | ICD-10-CM | POA: Diagnosis not present

## 2019-01-11 DIAGNOSIS — D509 Iron deficiency anemia, unspecified: Secondary | ICD-10-CM | POA: Insufficient documentation

## 2019-01-11 MED ORDER — SODIUM CHLORIDE 0.9 % IV SOLN
Freq: Once | INTRAVENOUS | Status: AC
  Administered 2019-01-11: 09:00:00 via INTRAVENOUS
  Filled 2019-01-11: qty 250

## 2019-01-11 MED ORDER — SODIUM CHLORIDE 0.9 % IV SOLN
510.0000 mg | Freq: Once | INTRAVENOUS | Status: AC
  Administered 2019-01-11: 510 mg via INTRAVENOUS
  Filled 2019-01-11: qty 17

## 2019-01-11 NOTE — Patient Instructions (Signed)
Ferumoxytol injection What is this medicine? FERUMOXYTOL is an iron complex. Iron is used to make healthy red blood cells, which carry oxygen and nutrients throughout the body. This medicine is used to treat iron deficiency anemia. This medicine may be used for other purposes; ask your health care provider or pharmacist if you have questions. COMMON BRAND NAME(S): Feraheme What should I tell my health care provider before I take this medicine? They need to know if you have any of these conditions:  anemia not caused by low iron levels  high levels of iron in the blood  magnetic resonance imaging (MRI) test scheduled  an unusual or allergic reaction to iron, other medicines, foods, dyes, or preservatives  pregnant or trying to get pregnant  breast-feeding How should I use this medicine? This medicine is for injection into a vein. It is given by a health care professional in a hospital or clinic setting. Talk to your pediatrician regarding the use of this medicine in children. Special care may be needed. Overdosage: If you think you have taken too much of this medicine contact a poison control center or emergency room at once. NOTE: This medicine is only for you. Do not share this medicine with others. What if I miss a dose? It is important not to miss your dose. Call your doctor or health care professional if you are unable to keep an appointment. What may interact with this medicine? This medicine may interact with the following medications:  other iron products This list may not describe all possible interactions. Give your health care provider a list of all the medicines, herbs, non-prescription drugs, or dietary supplements you use. Also tell them if you smoke, drink alcohol, or use illegal drugs. Some items may interact with your medicine. What should I watch for while using this medicine? Visit your doctor or healthcare professional regularly. Tell your doctor or healthcare  professional if your symptoms do not start to get better or if they get worse. You may need blood work done while you are taking this medicine. You may need to follow a special diet. Talk to your doctor. Foods that contain iron include: whole grains/cereals, dried fruits, beans, or peas, leafy green vegetables, and organ meats (liver, kidney). What side effects may I notice from receiving this medicine? Side effects that you should report to your doctor or health care professional as soon as possible:  allergic reactions like skin rash, itching or hives, swelling of the face, lips, or tongue  breathing problems  changes in blood pressure  feeling faint or lightheaded, falls  fever or chills  flushing, sweating, or hot feelings  swelling of the ankles or feet Side effects that usually do not require medical attention (report to your doctor or health care professional if they continue or are bothersome):  diarrhea  headache  nausea, vomiting  stomach pain This list may not describe all possible side effects. Call your doctor for medical advice about side effects. You may report side effects to FDA at 1-800-FDA-1088. Where should I keep my medicine? This drug is given in a hospital or clinic and will not be stored at home. NOTE: This sheet is a summary. It may not cover all possible information. If you have questions about this medicine, talk to your doctor, pharmacist, or health care provider.  2020 Elsevier/Gold Standard (2016-08-17 20:21:10) Coronavirus (COVID-19) Are you at risk?  Are you at risk for the Coronavirus (COVID-19)?  To be considered HIGH RISK for Coronavirus (COVID-19),   you have to meet the following criteria:  . Traveled to [place], [place], [place], [place] or [place]; or in the [place] to [place], [place], [place], or [place]; and have fever, cough, and shortness of breath within the last 2 weeks of travel OR . Been in close contact with a person  diagnosed with COVID-19 within the last 2 weeks and have fever, cough, and shortness of breath . IF YOU DO NOT MEET THESE CRITERIA, YOU ARE CONSIDERED LOW RISK FOR COVID-19.  What to do if you are HIGH RISK for COVID-19?  . If you are having a medical emergency, call 911. . Seek medical care right away. Before you go to a doctor's office, urgent care or emergency department, call ahead and tell them about your recent travel, contact with someone diagnosed with COVID-19, and your symptoms. You should receive instructions from your physician's office regarding next steps of care.  . When you arrive at healthcare provider, tell the healthcare staff immediately you have returned from visiting [place], [place], [place], [place] or [place]; or traveled in the [place] to [place], [place], [place], or [place]; in the last two weeks or you have been in close contact with a person diagnosed with COVID-19 in the last 2 weeks.   . Tell the health care staff about your symptoms: fever, cough and shortness of breath. . After you have been seen by a medical provider, you will be either: o Tested for (COVID-19) and discharged home on quarantine except to seek medical care if symptoms worsen, and asked to  - Stay home and avoid contact with others until you get your results (4-5 days)  - Avoid travel on public transportation if possible (such as bus, train, or airplane) or o Sent to the Emergency Department by EMS for evaluation, COVID-19 testing, and possible admission depending on your condition and test results.  What to do if you are LOW RISK for COVID-19?  Reduce your risk of any infection by using the same precautions used for avoiding the common cold or flu:  . Wash your hands often with soap and warm water for at least 20 seconds.  If soap and water are not readily available, use an alcohol-based hand sanitizer with at least 60% alcohol.  . If coughing or sneezing, cover your mouth and nose by  coughing or sneezing into the elbow areas of your shirt or coat, into a tissue or into your sleeve (not your hands). . Avoid shaking hands with others and consider head nods or verbal greetings only. . Avoid touching your eyes, nose, or mouth with unwashed hands.  . Avoid close contact with people who are sick. . Avoid places or events with large numbers of people in one location, like concerts or sporting events. . Carefully consider travel plans you have or are making. . If you are planning any travel outside or inside the [place], visit the CDC's Travelers' Health webpage for the latest health notices. . If you have some symptoms but not all symptoms, continue to monitor at home and seek medical attention if your symptoms worsen. . If you are having a medical emergency, call 911.   ADDITIONAL HEALTHCARE OPTIONS FOR PATIENTS   Telehealth / e-Visit: [email or web address]         [facility]: [phone]  [facility]: [phone]                   [facility]: [phone]   

## 2019-01-12 ENCOUNTER — Telehealth: Payer: Self-pay

## 2019-01-12 ENCOUNTER — Telehealth: Payer: Self-pay | Admitting: Hematology and Oncology

## 2019-01-12 NOTE — Telephone Encounter (Signed)
RN spoke with patient and confirmed upcoming appointment for IV iron.   Pt reports that after iron infusion on 01/11/2019 she vomited X 2 after leaving treatment, RN will notify MD for recommendations.  Pt also reports severe leg pain, right worse than left.  Pain reports pain has increased since receiving multiple iron transfusion.  Pain is being controlled with Ibuprofen, however wanted MD to be aware.

## 2019-01-12 NOTE — Telephone Encounter (Signed)
Scheduled appt per 7/1 sch message - unable to reach pt or spouse .and unable to leave message - sent message to RN to let her know I was unable to reach pt.

## 2019-01-16 ENCOUNTER — Encounter: Payer: Self-pay | Admitting: Pharmacy Technician

## 2019-01-16 NOTE — Progress Notes (Signed)
[employer] contacted me concerning a Feraheme application for drug assistance. [employer] was having problem contacting the patient to verify information on the application. I contacted the patient and she stated that she now has Medicaid. She questioned if Medicaid would cover previous treatments. I gave her Shauna's number to possibly get an answer to her question.

## 2019-01-17 ENCOUNTER — Telehealth: Payer: Self-pay

## 2019-01-17 ENCOUNTER — Other Ambulatory Visit: Payer: Self-pay

## 2019-01-17 DIAGNOSIS — D509 Iron deficiency anemia, unspecified: Secondary | ICD-10-CM

## 2019-01-17 NOTE — Telephone Encounter (Signed)
RN spoke with patient to follow up with MD recommendations.   Due to emesis after iron infusion, we will add pre-medication, Zofran 8mg  IV.  Pt voiced understanding.   Regarding significant leg pain, per MD could be related to iron deficiency.  RN educated patient to continue ibuprofen, and keep iron appointment.  Pt educated to update clinic if symptoms do not resolve after second infusion.

## 2019-01-18 ENCOUNTER — Inpatient Hospital Stay: Payer: Medicaid Other

## 2019-01-18 ENCOUNTER — Other Ambulatory Visit: Payer: Self-pay

## 2019-01-18 VITALS — BP 95/60 | HR 81 | Temp 98.5°F | Resp 18

## 2019-01-18 DIAGNOSIS — D509 Iron deficiency anemia, unspecified: Secondary | ICD-10-CM

## 2019-01-18 MED ORDER — SODIUM CHLORIDE 0.9 % IV SOLN
510.0000 mg | Freq: Once | INTRAVENOUS | Status: AC
  Administered 2019-01-18: 16:00:00 510 mg via INTRAVENOUS
  Filled 2019-01-18: qty 510

## 2019-01-18 MED ORDER — SODIUM CHLORIDE 0.9 % IV SOLN
Freq: Once | INTRAVENOUS | Status: AC
  Administered 2019-01-18: 16:00:00 via INTRAVENOUS
  Filled 2019-01-18: qty 250

## 2019-01-18 MED ORDER — ONDANSETRON HCL 4 MG/2ML IJ SOLN
8.0000 mg | Freq: Once | INTRAMUSCULAR | Status: AC
  Administered 2019-01-18: 16:00:00 8 mg via INTRAVENOUS

## 2019-01-18 MED ORDER — ONDANSETRON HCL 4 MG/2ML IJ SOLN
INTRAMUSCULAR | Status: AC
  Filled 2019-01-18: qty 4

## 2019-01-18 NOTE — Patient Instructions (Signed)
Ferumoxytol injection What is this medicine? FERUMOXYTOL is an iron complex. Iron is used to make healthy red blood cells, which carry oxygen and nutrients throughout the body. This medicine is used to treat iron deficiency anemia. This medicine may be used for other purposes; ask your health care provider or pharmacist if you have questions. COMMON BRAND NAME(S): Feraheme What should I tell my health care provider before I take this medicine? They need to know if you have any of these conditions:  anemia not caused by low iron levels  high levels of iron in the blood  magnetic resonance imaging (MRI) test scheduled  an unusual or allergic reaction to iron, other medicines, foods, dyes, or preservatives  pregnant or trying to get pregnant  breast-feeding How should I use this medicine? This medicine is for injection into a vein. It is given by a health care professional in a hospital or clinic setting. Talk to your pediatrician regarding the use of this medicine in children. Special care may be needed. Overdosage: If you think you have taken too much of this medicine contact a poison control center or emergency room at once. NOTE: This medicine is only for you. Do not share this medicine with others. What if I miss a dose? It is important not to miss your dose. Call your doctor or health care professional if you are unable to keep an appointment. What may interact with this medicine? This medicine may interact with the following medications:  other iron products This list may not describe all possible interactions. Give your health care provider a list of all the medicines, herbs, non-prescription drugs, or dietary supplements you use. Also tell them if you smoke, drink alcohol, or use illegal drugs. Some items may interact with your medicine. What should I watch for while using this medicine? Visit your doctor or healthcare professional regularly. Tell your doctor or healthcare  professional if your symptoms do not start to get better or if they get worse. You may need blood work done while you are taking this medicine. You may need to follow a special diet. Talk to your doctor. Foods that contain iron include: whole grains/cereals, dried fruits, beans, or peas, leafy green vegetables, and organ meats (liver, kidney). What side effects may I notice from receiving this medicine? Side effects that you should report to your doctor or health care professional as soon as possible:  allergic reactions like skin rash, itching or hives, swelling of the face, lips, or tongue  breathing problems  changes in blood pressure  feeling faint or lightheaded, falls  fever or chills  flushing, sweating, or hot feelings  swelling of the ankles or feet Side effects that usually do not require medical attention (report to your doctor or health care professional if they continue or are bothersome):  diarrhea  headache  nausea, vomiting  stomach pain This list may not describe all possible side effects. Call your doctor for medical advice about side effects. You may report side effects to FDA at 1-800-FDA-1088. Where should I keep my medicine? This drug is given in a hospital or clinic and will not be stored at home. NOTE: This sheet is a summary. It may not cover all possible information. If you have questions about this medicine, talk to your doctor, pharmacist, or health care provider.  2020 Elsevier/Gold Standard (2016-08-17 20:21:10) Coronavirus (COVID-19) Are you at risk?  Are you at risk for the Coronavirus (COVID-19)?  To be considered HIGH RISK for Coronavirus (COVID-19),   you have to meet the following criteria:  . Traveled to [place], [place], [place], [place] or [place]; or in the [place] to [place], [place], [place], or [place]; and have fever, cough, and shortness of breath within the last 2 weeks of travel OR . Been in close contact with a person  diagnosed with COVID-19 within the last 2 weeks and have fever, cough, and shortness of breath . IF YOU DO NOT MEET THESE CRITERIA, YOU ARE CONSIDERED LOW RISK FOR COVID-19.  What to do if you are HIGH RISK for COVID-19?  . If you are having a medical emergency, call 911. . Seek medical care right away. Before you go to a doctor's office, urgent care or emergency department, call ahead and tell them about your recent travel, contact with someone diagnosed with COVID-19, and your symptoms. You should receive instructions from your physician's office regarding next steps of care.  . When you arrive at healthcare provider, tell the healthcare staff immediately you have returned from visiting [place], [place], [place], [place] or [place]; or traveled in the [place] to [place], [place], [place], or [place]; in the last two weeks or you have been in close contact with a person diagnosed with COVID-19 in the last 2 weeks.   . Tell the health care staff about your symptoms: fever, cough and shortness of breath. . After you have been seen by a medical provider, you will be either: o Tested for (COVID-19) and discharged home on quarantine except to seek medical care if symptoms worsen, and asked to  - Stay home and avoid contact with others until you get your results (4-5 days)  - Avoid travel on public transportation if possible (such as bus, train, or airplane) or o Sent to the Emergency Department by EMS for evaluation, COVID-19 testing, and possible admission depending on your condition and test results.  What to do if you are LOW RISK for COVID-19?  Reduce your risk of any infection by using the same precautions used for avoiding the common cold or flu:  . Wash your hands often with soap and warm water for at least 20 seconds.  If soap and water are not readily available, use an alcohol-based hand sanitizer with at least 60% alcohol.  . If coughing or sneezing, cover your mouth and nose by  coughing or sneezing into the elbow areas of your shirt or coat, into a tissue or into your sleeve (not your hands). . Avoid shaking hands with others and consider head nods or verbal greetings only. . Avoid touching your eyes, nose, or mouth with unwashed hands.  . Avoid close contact with people who are sick. . Avoid places or events with large numbers of people in one location, like concerts or sporting events. . Carefully consider travel plans you have or are making. . If you are planning any travel outside or inside the [place], visit the CDC's Travelers' Health webpage for the latest health notices. . If you have some symptoms but not all symptoms, continue to monitor at home and seek medical attention if your symptoms worsen. . If you are having a medical emergency, call 911.   ADDITIONAL HEALTHCARE OPTIONS FOR PATIENTS  [facility] / e-Visit: [email or web address]         [facility]: [phone]  [facility]: [phone]                   [facility]: [phone]   

## 2019-01-23 ENCOUNTER — Other Ambulatory Visit: Payer: 59

## 2019-01-25 ENCOUNTER — Ambulatory Visit: Payer: 59 | Admitting: Hematology and Oncology

## 2019-01-26 NOTE — Assessment & Plan Note (Signed)
12/14/2018 labs: Hemoglobin 12.9, MCV 82.1, RDW 29.3, platelets 253, Ferritin: 6.7, iron saturation 14%, TIBC 433 11/17/2018: Hemoglobin 12.8, MCV 78, RDW 36.5, reticulocyte count 0.8% 11/11/2018: Hemoglobin 11.8, MCV 76.3, RDW 38 platelets 219 10/15/2018: Hemoglobin 6.9, MCV 59, RDW 19.7, ferritin 2, iron saturation 2%, TIBC 490  Differential diagnosis: Blood loss versus malabsorption Prior treatment: IV iron April 2020, July 2020  Lab review:

## 2019-01-30 ENCOUNTER — Other Ambulatory Visit: Payer: Self-pay

## 2019-01-30 ENCOUNTER — Inpatient Hospital Stay: Payer: Medicaid Other

## 2019-01-30 DIAGNOSIS — D509 Iron deficiency anemia, unspecified: Secondary | ICD-10-CM

## 2019-01-30 LAB — CBC WITH DIFFERENTIAL (CANCER CENTER ONLY)
Abs Immature Granulocytes: 0.03 10*3/uL (ref 0.00–0.07)
Basophils Absolute: 0.1 10*3/uL (ref 0.0–0.1)
Basophils Relative: 1 %
Eosinophils Absolute: 0.2 10*3/uL (ref 0.0–0.5)
Eosinophils Relative: 2 %
HCT: 38.8 % (ref 36.0–46.0)
Hemoglobin: 12.7 g/dL (ref 12.0–15.0)
Immature Granulocytes: 0 %
Lymphocytes Relative: 25 %
Lymphs Abs: 2 10*3/uL (ref 0.7–4.0)
MCH: 29.1 pg (ref 26.0–34.0)
MCHC: 32.7 g/dL (ref 30.0–36.0)
MCV: 88.8 fL (ref 80.0–100.0)
Monocytes Absolute: 0.6 10*3/uL (ref 0.1–1.0)
Monocytes Relative: 7 %
Neutro Abs: 5 10*3/uL (ref 1.7–7.7)
Neutrophils Relative %: 65 %
Platelet Count: 250 10*3/uL (ref 150–400)
RBC: 4.37 MIL/uL (ref 3.87–5.11)
RDW: 13.8 % (ref 11.5–15.5)
WBC Count: 7.8 10*3/uL (ref 4.0–10.5)
nRBC: 0 % (ref 0.0–0.2)

## 2019-01-30 LAB — IRON AND TIBC
Iron: 88 ug/dL (ref 41–142)
Saturation Ratios: 36 % (ref 21–57)
TIBC: 246 ug/dL (ref 236–444)
UIBC: 158 ug/dL (ref 120–384)

## 2019-01-30 LAB — FERRITIN: Ferritin: 224 ng/mL (ref 11–307)

## 2019-01-31 NOTE — Progress Notes (Signed)
HEMATOLOGY-ONCOLOGY PROGRESS NOTE   CHIEF COMPLIANT: Iron deficiency anemia  INTERVAL HISTORY: Meredith [name] is a 38 y.o. female with above-mentioned history of iron deficiency anemia previously treated with IV iron. Labs on 01/30/19 showed: Hg 12.7, HCT 38.8, MCV 88.8, iron saturation 36%, ferritin 224. She presents over [facility] today for follow-up.  Her biggest complaint today shinsplints.  She does not exercise she does not do anything to put pressure on her legs but these pains are worse in the right leg.  REVIEW OF SYSTEMS:   Constitutional: Denies fevers, chills or abnormal weight loss Eyes: Denies blurriness of vision Ears, nose, mouth, throat, and face: Denies mucositis or sore throat Respiratory: Denies cough, dyspnea or wheezes Cardiovascular: Denies palpitation, chest discomfort Gastrointestinal:  Denies nausea, heartburn or change in bowel habits Skin: Denies abnormal skin rashes Lymphatics: Denies new lymphadenopathy or easy bruising Neurological:Denies numbness, tingling or new weaknesses Behavioral/Psych: Mood is stable, no new changes  Extremities: No lower extremity edema Breast: denies any pain or lumps or nodules in either breasts All other systems were reviewed with the patient and are negative.  Observations/Objective:  Today's Vitals   02/01/19 1534  BP: 112/66  Pulse: 84  Resp: 18  Temp: 98 F (36.7 C)  TempSrc: Oral  SpO2: 99%  Weight: 165 lb 1.6 oz (74.9 kg)  Height: 5\' 2"  (1.575 m)   Body mass index is 30.2 kg/m.  I have reviewed the data as listed CMP Latest Ref Rng & Units 07/15/2014 10/27/2012 08/06/2011  Glucose 70 - 99 mg/dL 108(H) 89 178(H)  BUN 6 - 23 mg/dL 17 15 7   Creatinine 0.50 - 1.10 mg/dL 0.75 0.79 0.97  Sodium 135 - 145 mmol/L 135 141 138  Potassium 3.5 - 5.1 mmol/L 3.6 4.7 3.2(L)  Chloride 96 - 112 mEq/L 105 105 105  CO2 19 - 32 mmol/L 25 26 25   Calcium 8.4 - 10.5 mg/dL 9.1 9.7 8.2(L)  Total Protein 6.0 - 8.3 g/dL 8.1  7.2 -  Total Bilirubin 0.3 - 1.2 mg/dL 0.8 0.5 -  Alkaline Phos 39 - 117 U/L 76 73 -  AST 0 - 37 U/L 21 20 -  ALT 0 - 35 U/L 24 31 -    Lab Results  Component Value Date   WBC 7.8 01/30/2019   HGB 12.7 01/30/2019   HCT 38.8 01/30/2019   MCV 88.8 01/30/2019   PLT 250 01/30/2019   NEUTROABS 5.0 01/30/2019      Assessment Plan:  Iron deficiency anemia 12/14/2018 labs: Hemoglobin 12.9, MCV 82.1, RDW 29.3, platelets 253, Ferritin: 6.7, iron saturation 14%, TIBC 433 11/17/2018: Hemoglobin 12.8, MCV 78, RDW 36.5, reticulocyte count 0.8% 11/11/2018: Hemoglobin 11.8, MCV 76.3, RDW 38 platelets 219 10/15/2018: Hemoglobin 6.9, MCV 59, RDW 19.7, ferritin 2, iron saturation 2%, TIBC 490  Differential diagnosis: Blood loss versus malabsorption Prior treatment: IV iron April 2020, July 2020  Lab review: Hemoglobin 12.7, iron saturation 36%, ferritin 224 I discussed with the patient the results of the iron studies are excellent.  Leg pains: Unclear etiology: It did not improve in spite of IV iron treatment Fatigue: We will add TSH today.  We will see her back in 6 months with labs done 2 days ahead of the visit. I provided 15 minutes of face-to-face time during this encounter.    Rulon Eisenmenger, MD 02/01/2019   I, Molly Dorshimer, am acting as scribe for Nicholas Lose, MD.  I have reviewed the above documentation for accuracy and completeness,  and I agree with the above.

## 2019-02-01 ENCOUNTER — Inpatient Hospital Stay (HOSPITAL_BASED_OUTPATIENT_CLINIC_OR_DEPARTMENT_OTHER): Payer: Medicaid Other | Admitting: Hematology and Oncology

## 2019-02-01 VITALS — BP 112/66 | HR 84 | Temp 98.0°F | Resp 18 | Ht 62.0 in | Wt 165.1 lb

## 2019-02-01 DIAGNOSIS — M79606 Pain in leg, unspecified: Secondary | ICD-10-CM | POA: Diagnosis not present

## 2019-02-01 DIAGNOSIS — D509 Iron deficiency anemia, unspecified: Secondary | ICD-10-CM

## 2019-02-01 DIAGNOSIS — R5383 Other fatigue: Secondary | ICD-10-CM

## 2019-02-02 ENCOUNTER — Telehealth: Payer: Self-pay | Admitting: Hematology and Oncology

## 2019-02-02 NOTE — Telephone Encounter (Signed)
I could not reach patient I will mail schedule  

## 2019-02-03 ENCOUNTER — Inpatient Hospital Stay: Payer: Medicaid Other

## 2019-02-03 ENCOUNTER — Other Ambulatory Visit: Payer: Self-pay

## 2019-02-03 DIAGNOSIS — D509 Iron deficiency anemia, unspecified: Secondary | ICD-10-CM | POA: Diagnosis not present

## 2019-02-03 DIAGNOSIS — R5383 Other fatigue: Secondary | ICD-10-CM

## 2019-02-06 LAB — TSH: TSH: 3.001 u[IU]/mL (ref 0.308–3.960)

## 2019-03-06 ENCOUNTER — Inpatient Hospital Stay: Payer: Medicaid Other | Attending: Hematology and Oncology

## 2019-03-06 ENCOUNTER — Other Ambulatory Visit: Payer: Self-pay | Admitting: *Deleted

## 2019-03-06 ENCOUNTER — Other Ambulatory Visit: Payer: Self-pay

## 2019-03-06 DIAGNOSIS — D509 Iron deficiency anemia, unspecified: Secondary | ICD-10-CM | POA: Insufficient documentation

## 2019-03-06 LAB — CBC WITH DIFFERENTIAL (CANCER CENTER ONLY)
Abs Immature Granulocytes: 0.03 10*3/uL (ref 0.00–0.07)
Basophils Absolute: 0.1 10*3/uL (ref 0.0–0.1)
Basophils Relative: 1 %
Eosinophils Absolute: 0.2 10*3/uL (ref 0.0–0.5)
Eosinophils Relative: 2 %
HCT: 39.9 % (ref 36.0–46.0)
Hemoglobin: 13.5 g/dL (ref 12.0–15.0)
Immature Granulocytes: 0 %
Lymphocytes Relative: 25 %
Lymphs Abs: 2.2 10*3/uL (ref 0.7–4.0)
MCH: 30.1 pg (ref 26.0–34.0)
MCHC: 33.8 g/dL (ref 30.0–36.0)
MCV: 88.9 fL (ref 80.0–100.0)
Monocytes Absolute: 0.6 10*3/uL (ref 0.1–1.0)
Monocytes Relative: 6 %
Neutro Abs: 5.9 10*3/uL (ref 1.7–7.7)
Neutrophils Relative %: 66 %
Platelet Count: 266 10*3/uL (ref 150–400)
RBC: 4.49 MIL/uL (ref 3.87–5.11)
RDW: 13.3 % (ref 11.5–15.5)
WBC Count: 9 10*3/uL (ref 4.0–10.5)
nRBC: 0 % (ref 0.0–0.2)

## 2019-03-06 LAB — RETICULOCYTES
Immature Retic Fract: 7.7 % (ref 2.3–15.9)
RBC.: 4.34 MIL/uL (ref 3.87–5.11)
Retic Count, Absolute: 79.9 10*3/uL (ref 19.0–186.0)
Retic Ct Pct: 1.8 % (ref 0.4–3.1)

## 2019-03-06 NOTE — Progress Notes (Signed)
Received call from pt requesting workup be done for Sickle Cell Anemia.  Per Dr. Lindi Adie, pt to have lab workup done.  Orders placed and pt schedule for lab apt.

## 2019-03-07 LAB — IRON AND TIBC
Iron: 47 ug/dL (ref 41–142)
Saturation Ratios: 19 % — ABNORMAL LOW (ref 21–57)
TIBC: 250 ug/dL (ref 236–444)
UIBC: 204 ug/dL (ref 120–384)

## 2019-03-07 LAB — HEMOGLOBINOPATHY EVALUATION
Hgb A2 Quant: 2.5 % (ref 1.8–3.2)
Hgb A: 97.5 % (ref 96.4–98.8)
Hgb C: 0 %
Hgb F Quant: 0 % (ref 0.0–2.0)
Hgb S Quant: 0 %
Hgb Variant: 0 %

## 2019-03-07 LAB — FERRITIN: Ferritin: 90 ng/mL (ref 11–307)

## 2019-04-17 ENCOUNTER — Other Ambulatory Visit: Payer: Self-pay | Admitting: *Deleted

## 2019-04-17 ENCOUNTER — Telehealth: Payer: Self-pay | Admitting: *Deleted

## 2019-04-17 ENCOUNTER — Telehealth: Payer: Self-pay | Admitting: Hematology and Oncology

## 2019-04-17 DIAGNOSIS — D509 Iron deficiency anemia, unspecified: Secondary | ICD-10-CM

## 2019-04-17 NOTE — Progress Notes (Signed)
Can you start edema here  Patient Care Team: [name] as PCP - General (Physician Assistant)  DIAGNOSIS:    ICD-10-CM   1. Iron deficiency anemia, unspecified iron deficiency anemia type  D50.9     CHIEF COMPLIANT: Iron deficiency anemia   INTERVAL HISTORY: Meredith [name] is a 38 y.o. with above-mentioned history of  iron deficiency anemia previously treated with IV iron. Labs on 03/06/19 showed: Hg 13.5, HCT 39.9, MCV 88.9, iron good good saturation 19%, ferritin 90, and hemoglobinopathy evaluation showed normal adult hemoglobin. She presents to the clinic today for follow-up.  Patient is extremely exhausted and has been drowsy and sleepy all day.  She tells me that after iron she feels better for a little while and then it slowly declines.  She strongly feels that she needs an iron infusion at this time.  REVIEW OF SYSTEMS:   Constitutional: Denies fevers, chills or abnormal weight loss Eyes: Denies blurriness of vision Ears, nose, mouth, throat, and face: Denies mucositis or sore throat Respiratory: Denies cough, dyspnea or wheezes Cardiovascular: Denies palpitation, chest discomfort Gastrointestinal: Denies nausea, heartburn or change in bowel habits Skin: Denies abnormal skin rashes Lymphatics: Denies new lymphadenopathy or easy bruising Neurological: Denies numbness, tingling or new weaknesses Behavioral/Psych: Mood is stable, no new changes  Extremities: No lower extremity edema Breast: denies any pain or lumps or nodules in either breasts All other systems were reviewed with the patient and are negative.  I have reviewed the past medical history, past surgical history, social history and family history with the patient and they are unchanged from previous note.  ALLERGIES:  is allergic to morphine and related and promethazine hcl.  MEDICATIONS:  Current Outpatient Medications  Medication Sig Dispense Refill  . metoCLOPramide (REGLAN) 10 MG tablet  Take 1 tablet (10 mg total) by mouth every 6 (six) hours as needed for nausea. 10 tablet 0  . ondansetron (ZOFRAN) 4 MG tablet Take 1 tablet (4 mg total) by mouth every 6 (six) hours. 12 tablet 0   No current facility-administered medications for this visit.     PHYSICAL EXAMINATION: ECOG PERFORMANCE STATUS: 1 - Symptomatic but completely ambulatory  Vitals:   04/18/19 0944  BP: 111/71  Pulse: 88  Resp: 18  Temp: 98 F (36.7 C)  SpO2: 98%   Filed Weights   04/18/19 0944  Weight: 167 lb 9.6 oz (76 kg)    GENERAL: alert, no distress and comfortable SKIN: skin color, texture, turgor are normal, no rashes or significant lesions EYES: normal, Conjunctiva are pink and non-injected, sclera clear OROPHARYNX: no exudate, no erythema and lips, buccal mucosa, and tongue normal  NECK: supple, thyroid normal size, non-tender, without nodularity LYMPH: no palpable lymphadenopathy in the cervical, axillary or inguinal LUNGS: clear to auscultation and percussion with normal breathing effort HEART: regular rate & rhythm and no murmurs and no lower extremity edema ABDOMEN: abdomen soft, non-tender and normal bowel sounds MUSCULOSKELETAL: no cyanosis of digits and no clubbing  NEURO: alert & oriented x 3 with fluent speech, no focal motor/sensory deficits EXTREMITIES: No lower extremity edema  LABORATORY DATA:  I have reviewed the data as listed CMP Latest Ref Rng & Units 07/15/2014 10/27/2012 08/06/2011  Glucose 70 - 99 mg/dL 119(E) 89 174(Y)  BUN 6 - 23 mg/dL 17 15 7   Creatinine 0.50 - 1.10 mg/dL 8.14 4.81  Sodium 135 - 145 mmol/L 135 141 138  Potassium 3.5 - 5.1 mmol/L 3.6 4.7 3.2(L)  Chloride 96 - 112 mEq/L 105 105 105  CO2 19 - 32 mmol/L 25 26 25   Calcium 8.4 - 10.5 mg/dL 9.1 9.7 8.2(L)  Total Protein 6.0 - 8.3 g/dL 8.1 7.2 -  Total Bilirubin 0.3 - 1.2 mg/dL 0.8 0.5 -  Alkaline Phos 39 - 117 U/L 76 73 -  AST 0 - 37 U/L 21 20 -  ALT 0 - 35 U/L 24 31 -    Lab Results   Component Value Date   WBC 9.3 04/18/2019   HGB 13.9 04/18/2019   HCT 42.5 04/18/2019   MCV 92.0 04/18/2019   PLT 297 04/18/2019   NEUTROABS 6.3 04/18/2019    ASSESSMENT & PLAN:  Iron deficiency anemia 12/14/2018 labs: Hemoglobin 12.9, MCV 82.1, RDW 29.3, platelets 253,Ferritin: 6.7, iron saturation 14%, TIBC 433 11/17/2018: Hemoglobin 12.8, MCV 78, RDW 36.5, reticulocyte count 0.8% 11/11/2018: Hemoglobin 11.8, MCV 76.3, RDW 38 platelets 219 10/15/2018: Hemoglobin 6.9, MCV 59, RDW 19.7, ferritin 2, iron saturation 2%, TIBC 490  Differential diagnosis:Blood loss versus malabsorption Prior treatment: IV iron April 2020, July 2020  Lab review: 03/06/2019: Hemoglobin electrophoresis: Normal Ferritin 90, iron saturation 19%, hemoglobin 13.5, MCV 89, reticulocytes 79.9, 1.8%  Severe fatigue: This is because of profound inability to work or do any activities at home.  She is considering applying for disability. Based on the severity of her symptoms I am recommending IV iron therapy.  I will see her 2 months after IV iron to repeat her blood work and assess her IV iron needs in the future.   No orders of the defined types were placed in this encounter.  The patient has a good understanding of the overall plan. she agrees with it. she will call with any problems that may develop before the next visit here.  Nicholas Lose, MD 04/18/2019  Julious Oka Dorshimer am acting as scribe for Dr. Nicholas Lose.  I have reviewed the above documentation for accuracy and completeness, and I agree with the above.

## 2019-04-17 NOTE — Telephone Encounter (Signed)
Yes please arrange lab and follow up with me CBCD, Iron studies and ferritin

## 2019-04-17 NOTE — Telephone Encounter (Signed)
Pt left a message stating she has been tired and in bed for the past 2 days.  Wants to know if she should come in to have her CBC checked.

## 2019-04-17 NOTE — Telephone Encounter (Signed)
Scheduled appt per 10/5 sch message - pt is aware of appt date and time   

## 2019-04-18 ENCOUNTER — Other Ambulatory Visit: Payer: Self-pay

## 2019-04-18 ENCOUNTER — Inpatient Hospital Stay: Payer: Medicaid Other | Attending: Hematology and Oncology | Admitting: Hematology and Oncology

## 2019-04-18 ENCOUNTER — Inpatient Hospital Stay: Payer: Medicaid Other

## 2019-04-18 DIAGNOSIS — D509 Iron deficiency anemia, unspecified: Secondary | ICD-10-CM

## 2019-04-18 DIAGNOSIS — Z79899 Other long term (current) drug therapy: Secondary | ICD-10-CM | POA: Insufficient documentation

## 2019-04-18 LAB — CBC WITH DIFFERENTIAL (CANCER CENTER ONLY)
Abs Immature Granulocytes: 0.02 10*3/uL (ref 0.00–0.07)
Basophils Absolute: 0.1 10*3/uL (ref 0.0–0.1)
Basophils Relative: 1 %
Eosinophils Absolute: 0.2 10*3/uL (ref 0.0–0.5)
Eosinophils Relative: 2 %
HCT: 42.5 % (ref 36.0–46.0)
Hemoglobin: 13.9 g/dL (ref 12.0–15.0)
Immature Granulocytes: 0 %
Lymphocytes Relative: 22 %
Lymphs Abs: 2 10*3/uL (ref 0.7–4.0)
MCH: 30.1 pg (ref 26.0–34.0)
MCHC: 32.7 g/dL (ref 30.0–36.0)
MCV: 92 fL (ref 80.0–100.0)
Monocytes Absolute: 0.6 10*3/uL (ref 0.1–1.0)
Monocytes Relative: 7 %
Neutro Abs: 6.3 10*3/uL (ref 1.7–7.7)
Neutrophils Relative %: 68 %
Platelet Count: 297 10*3/uL (ref 150–400)
RBC: 4.62 MIL/uL (ref 3.87–5.11)
RDW: 12.6 % (ref 11.5–15.5)
WBC Count: 9.3 10*3/uL (ref 4.0–10.5)
nRBC: 0 % (ref 0.0–0.2)

## 2019-04-18 LAB — IRON AND TIBC
Iron: 92 ug/dL (ref 41–142)
Saturation Ratios: 32 % (ref 21–57)
TIBC: 289 ug/dL (ref 236–444)
UIBC: 197 ug/dL (ref 120–384)

## 2019-04-18 LAB — FERRITIN: Ferritin: 25 ng/mL (ref 11–307)

## 2019-04-18 NOTE — Assessment & Plan Note (Signed)
12/14/2018 labs: Hemoglobin 12.9, MCV 82.1, RDW 29.3, platelets 253,Ferritin: 6.7, iron saturation 14%, TIBC 433 11/17/2018: Hemoglobin 12.8, MCV 78, RDW 36.5, reticulocyte count 0.8% 11/11/2018: Hemoglobin 11.8, MCV 76.3, RDW 38 platelets 219 10/15/2018: Hemoglobin 6.9, MCV 59, RDW 19.7, ferritin 2, iron saturation 2%, TIBC 490  Differential diagnosis:Blood loss versus malabsorption Prior treatment: IV iron April 2020, July 2020  Lab review: 03/06/2019: Hemoglobin electrophoresis: Normal Ferritin 90, iron saturation 19%, hemoglobin 13.5, MCV 89, reticulocytes 79.9, 1.8%

## 2019-04-19 ENCOUNTER — Telehealth: Payer: Self-pay | Admitting: Hematology and Oncology

## 2019-04-19 NOTE — Telephone Encounter (Signed)
I talk with patient regarding schedule  

## 2019-04-24 ENCOUNTER — Other Ambulatory Visit: Payer: Self-pay

## 2019-04-24 ENCOUNTER — Inpatient Hospital Stay: Payer: Medicaid Other

## 2019-04-24 VITALS — BP 97/66 | HR 73 | Temp 97.8°F | Resp 17 | Wt 170.0 lb

## 2019-04-24 DIAGNOSIS — D509 Iron deficiency anemia, unspecified: Secondary | ICD-10-CM | POA: Diagnosis not present

## 2019-04-24 MED ORDER — ONDANSETRON HCL 4 MG/2ML IJ SOLN
8.0000 mg | Freq: Once | INTRAMUSCULAR | Status: AC
  Administered 2019-04-24: 8 mg via INTRAVENOUS

## 2019-04-24 MED ORDER — SODIUM CHLORIDE 0.9 % IV SOLN
Freq: Once | INTRAVENOUS | Status: AC
  Administered 2019-04-24: 09:00:00 via INTRAVENOUS
  Filled 2019-04-24: qty 250

## 2019-04-24 MED ORDER — ONDANSETRON HCL 4 MG/2ML IJ SOLN
INTRAMUSCULAR | Status: AC
  Filled 2019-04-24: qty 4

## 2019-04-24 MED ORDER — ACETAMINOPHEN 325 MG PO TABS: 650.0000 mg | ORAL_TABLET | Freq: Once | ORAL | Status: DC

## 2019-04-24 MED ORDER — SODIUM CHLORIDE 0.9 % IV SOLN
510.0000 mg | Freq: Once | INTRAVENOUS | Status: AC
  Administered 2019-04-24: 510 mg via INTRAVENOUS
  Filled 2019-04-24: qty 510

## 2019-04-24 NOTE — Patient Instructions (Signed)

## 2019-04-27 ENCOUNTER — Telehealth: Payer: Self-pay | Admitting: Hematology and Oncology

## 2019-04-27 NOTE — Telephone Encounter (Signed)
Yes

## 2019-04-27 NOTE — Telephone Encounter (Signed)
Returned patient's phone call regarding rescheduling 10/19 appointment, per patient's request appointment has moved to 10/20.  Message to provider.

## 2019-05-01 ENCOUNTER — Ambulatory Visit: Payer: Medicaid Other

## 2019-05-02 ENCOUNTER — Other Ambulatory Visit: Payer: Self-pay

## 2019-05-02 ENCOUNTER — Inpatient Hospital Stay: Payer: Medicaid Other

## 2019-05-02 VITALS — BP 110/66 | HR 90 | Temp 98.3°F | Resp 16

## 2019-05-02 DIAGNOSIS — D509 Iron deficiency anemia, unspecified: Secondary | ICD-10-CM

## 2019-05-02 MED ORDER — ACETAMINOPHEN 325 MG PO TABS: 650.0000 mg | ORAL_TABLET | Freq: Once | ORAL | Status: DC

## 2019-05-02 MED ORDER — ONDANSETRON HCL 4 MG/2ML IJ SOLN
8.0000 mg | Freq: Once | INTRAMUSCULAR | Status: AC
  Administered 2019-05-02: 8 mg via INTRAVENOUS

## 2019-05-02 MED ORDER — SODIUM CHLORIDE 0.9 % IV SOLN
Freq: Once | INTRAVENOUS | Status: AC
  Administered 2019-05-02: 09:00:00 via INTRAVENOUS
  Filled 2019-05-02: qty 250

## 2019-05-02 MED ORDER — SODIUM CHLORIDE 0.9 % IV SOLN
510.0000 mg | Freq: Once | INTRAVENOUS | Status: AC
  Administered 2019-05-02: 510 mg via INTRAVENOUS
  Filled 2019-05-02: qty 510

## 2019-05-02 MED ORDER — ONDANSETRON HCL 4 MG/2ML IJ SOLN
INTRAMUSCULAR | Status: AC
  Filled 2019-05-02: qty 4

## 2019-05-02 NOTE — Patient Instructions (Signed)

## 2019-06-16 ENCOUNTER — Encounter (HOSPITAL_COMMUNITY): Payer: Self-pay

## 2019-06-16 ENCOUNTER — Emergency Department (HOSPITAL_COMMUNITY)
Admission: EM | Admit: 2019-06-16 | Discharge: 2019-06-16 | Disposition: A | Discharge status: Home / Self Care | Payer: Medicaid Other | Attending: Emergency Medicine | Admitting: Emergency Medicine

## 2019-06-16 ENCOUNTER — Other Ambulatory Visit: Payer: Self-pay

## 2019-06-16 DIAGNOSIS — R5383 Other fatigue: Secondary | ICD-10-CM | POA: Insufficient documentation

## 2019-06-16 DIAGNOSIS — R531 Weakness: Secondary | ICD-10-CM | POA: Insufficient documentation

## 2019-06-16 DIAGNOSIS — Z5321 Procedure and treatment not carried out due to patient leaving prior to being seen by health care provider: Secondary | ICD-10-CM | POA: Insufficient documentation

## 2019-06-16 HISTORY — DX: Iron deficiency anemia, unspecified: D50.9

## 2019-06-16 NOTE — ED Triage Notes (Signed)
Patient reports that she is feeling weak and tired. Patient has a history of chronic  Anemia. Patient was told to get blood work done at the [facility], but they informed her that the lab was closed after she waited for approx 30 minutes. Patient here for blood work.

## 2019-06-20 ENCOUNTER — Inpatient Hospital Stay: Payer: Medicaid Other | Attending: Hematology and Oncology

## 2019-06-20 ENCOUNTER — Telehealth: Payer: Self-pay | Admitting: Hematology and Oncology

## 2019-06-20 ENCOUNTER — Other Ambulatory Visit: Payer: Self-pay

## 2019-06-20 DIAGNOSIS — R5382 Chronic fatigue, unspecified: Secondary | ICD-10-CM | POA: Diagnosis not present

## 2019-06-20 DIAGNOSIS — D509 Iron deficiency anemia, unspecified: Secondary | ICD-10-CM | POA: Diagnosis present

## 2019-06-20 LAB — CBC WITH DIFFERENTIAL (CANCER CENTER ONLY)
Abs Immature Granulocytes: 0.03 10*3/uL (ref 0.00–0.07)
Basophils Absolute: 0.1 10*3/uL (ref 0.0–0.1)
Basophils Relative: 1 %
Eosinophils Absolute: 0.1 10*3/uL (ref 0.0–0.5)
Eosinophils Relative: 2 %
HCT: 42.2 % (ref 36.0–46.0)
Hemoglobin: 14.1 g/dL (ref 12.0–15.0)
Immature Granulocytes: 0 %
Lymphocytes Relative: 26 %
Lymphs Abs: 2.1 10*3/uL (ref 0.7–4.0)
MCH: 29.8 pg (ref 26.0–34.0)
MCHC: 33.4 g/dL (ref 30.0–36.0)
MCV: 89.2 fL (ref 80.0–100.0)
Monocytes Absolute: 0.6 10*3/uL (ref 0.1–1.0)
Monocytes Relative: 7 %
Neutro Abs: 5.3 10*3/uL (ref 1.7–7.7)
Neutrophils Relative %: 64 %
Platelet Count: 283 10*3/uL (ref 150–400)
RBC: 4.73 MIL/uL (ref 3.87–5.11)
RDW: 11.9 % (ref 11.5–15.5)
WBC Count: 8.2 10*3/uL (ref 4.0–10.5)
nRBC: 0 % (ref 0.0–0.2)

## 2019-06-20 LAB — FERRITIN: Ferritin: 182 ng/mL (ref 11–307)

## 2019-06-20 LAB — IRON AND TIBC
Iron: 82 ug/dL (ref 41–142)
Saturation Ratios: 36 % (ref 21–57)
TIBC: 231 ug/dL — ABNORMAL LOW (ref 236–444)
UIBC: 149 ug/dL (ref 120–384)

## 2019-06-20 NOTE — Telephone Encounter (Signed)
Patient called to reschedule 12/09 lab appointment to 12/08, per patient's request appointment has been moved. Patient will still come in for follow-up appointment on 12/09.  Message to provider.

## 2019-06-20 NOTE — Progress Notes (Signed)
Patient Care Team: Etter Sjogren as PCP - General (Physician Assistant)  DIAGNOSIS:    ICD-10-CM   1. Chronic fatigue  R53.82 Ambulatory referral to Endocrinology  2. Iron deficiency anemia, unspecified iron deficiency anemia type  D50.9     CHIEF COMPLIANT: Follow-up of iron deficiency anemia   INTERVAL HISTORY: Meredith [name] is a 38 y.o. with above-mentioned history ofiron deficiency anemiapreviously treated with IV iron. Labs on 06/20/19 showed: Hg 14.1, HCT 42.2, MCV 89.2, iron saturation 82%, ferritin 182. She presentsto the clinic today for follow-up. Continues to have profound fatigue that has not improved at all with iron infusion.  Her mother tells me that she sleeps most of the time.  REVIEW OF SYSTEMS:   Constitutional: Severe fatigue severe fatigue and weight gain Eyes: Denies blurriness of vision Ears, nose, mouth, throat, and face: Denies mucositis or sore throat Respiratory: Denies cough, dyspnea or wheezes Cardiovascular: Denies palpitation, chest discomfort Gastrointestinal: Denies nausea, heartburn or change in bowel habits Skin: Denies abnormal skin rashes Lymphatics: Denies new lymphadenopathy or easy bruising Neurological: Denies numbness, tingling or new weaknesses Behavioral/Psych: Mood is stable, no new changes  Extremities: No lower extremity edema Breast: denies any pain or lumps or nodules in either breasts All other systems were reviewed with the patient and are negative.  I have reviewed the past medical history, past surgical history, social history and family history with the patient and they are unchanged from previous note.  ALLERGIES:  is allergic to morphine and related and promethazine hcl.  MEDICATIONS:  No current outpatient medications on file.   No current facility-administered medications for this visit.     PHYSICAL EXAMINATION: ECOG PERFORMANCE STATUS: 1 - Symptomatic but completely ambulatory  Vitals:    06/21/19 1055  BP: 106/75  Pulse: 80  Temp: 98.1 F (36.7 C)  SpO2: 99%   Filed Weights   06/21/19 1055  Weight: 168 lb 4.8 oz (76.3 kg)    GENERAL: alert, no distress and comfortable SKIN: skin color, texture, turgor are normal, no rashes or significant lesions EYES: normal, Conjunctiva are pink and non-injected, sclera clear OROPHARYNX: no exudate, no erythema and lips, buccal mucosa, and tongue normal  NECK: supple, thyroid normal size, non-tender, without nodularity LYMPH: no palpable lymphadenopathy in the cervical, axillary or inguinal LUNGS: clear to auscultation and percussion with normal breathing effort HEART: regular rate & rhythm and no murmurs and no lower extremity edema ABDOMEN: abdomen soft, non-tender and normal bowel sounds MUSCULOSKELETAL: no cyanosis of digits and no clubbing  NEURO: alert & oriented x 3 with fluent speech, no focal motor/sensory deficits EXTREMITIES: No lower extremity edema  LABORATORY DATA:  I have reviewed the data as listed CMP Latest Ref Rng & Units 07/15/2014 10/27/2012 08/06/2011  Glucose 70 - 99 mg/dL 108(H) 89 178(H)  BUN 6 - 23 mg/dL 17 15 7   Creatinine 0.50 - 1.10 mg/dL 0.75 0.79 0.97  Sodium 135 - 145 mmol/L 135 141 138  Potassium 3.5 - 5.1 mmol/L 3.6 4.7 3.2(L)  Chloride 96 - 112 mEq/L 105 105 105  CO2 19 - 32 mmol/L 25 26 25   Calcium 8.4 - 10.5 mg/dL 9.1 9.7 8.2(L)  Total Protein 6.0 - 8.3 g/dL 8.1 7.2 -  Total Bilirubin 0.3 - 1.2 mg/dL 0.8 0.5 -  Alkaline Phos 39 - 117 U/L 76 73 -  AST 0 - 37 U/L 21 20 -  ALT 0 - 35 U/L 24 31 -    Lab  Results  Component Value Date   WBC 8.2 06/20/2019   HGB 14.1 06/20/2019   HCT 42.2 06/20/2019   MCV 89.2 06/20/2019   PLT 283 06/20/2019   NEUTROABS 5.3 06/20/2019    ASSESSMENT & PLAN:  Iron deficiency anemia 12/14/2018 labs: Hemoglobin 12.9, MCV 82.1, RDW 29.3, platelets 253,Ferritin: 6.7, iron saturation 14%, TIBC 433 11/17/2018: Hemoglobin 12.8, MCV 78, RDW 36.5, reticulocyte  count 0.8% 11/11/2018: Hemoglobin 11.8, MCV 76.3, RDW 38 platelets 219 10/15/2018: Hemoglobin 6.9, MCV 59, RDW 19.7, ferritin 2, iron saturation 2%, TIBC 490 06/20/2019: Hb: 14.1, MCV 89, Ferritin 182, Iron sat 36%  Differential diagnosis:Blood loss versus malabsorption Prior treatment: IV iron April 2020, July 2020, Oct 2020  Lab review: Excellent response to IV Iron given in October. Severe fatigue: Unclear etiology.  It did not improve with IV iron infusion. Therefore I recommended that she take 5000 mcg of vitamin B12. I also recommended endocrinology consultation.  I sent a referral to Dr.Kerr  RTC in 3 months with labs done ahead of time and follow up    Orders Placed This Encounter  Procedures  . Ambulatory referral to Endocrinology    Referral Priority:   Routine    Referral Type:   Consultation    Referral Reason:   Specialty Services Required    Referred to Provider:   Talmage Coin, MD    Number of Visits Requested:   1   The patient has a good understanding of the overall plan. she agrees with it. she will call with any problems that may develop before the next visit here.  Serena Croissant, MD 06/21/2019  Jenness Corner Dorshimer, am acting as scribe for Dr. Serena Croissant.  I have reviewed the above documentation for accuracy and completeness, and I agree with the above.

## 2019-06-21 ENCOUNTER — Inpatient Hospital Stay (HOSPITAL_BASED_OUTPATIENT_CLINIC_OR_DEPARTMENT_OTHER): Payer: Medicaid Other | Admitting: Hematology and Oncology

## 2019-06-21 ENCOUNTER — Other Ambulatory Visit: Payer: Medicaid Other

## 2019-06-21 ENCOUNTER — Inpatient Hospital Stay: Payer: Medicaid Other

## 2019-06-21 ENCOUNTER — Other Ambulatory Visit: Payer: Self-pay

## 2019-06-21 VITALS — BP 106/75 | HR 80 | Temp 98.1°F | Ht 62.0 in | Wt 168.3 lb

## 2019-06-21 DIAGNOSIS — R5382 Chronic fatigue, unspecified: Secondary | ICD-10-CM | POA: Diagnosis not present

## 2019-06-21 DIAGNOSIS — D509 Iron deficiency anemia, unspecified: Secondary | ICD-10-CM | POA: Diagnosis not present

## 2019-06-21 NOTE — Assessment & Plan Note (Signed)
12/14/2018 labs: Hemoglobin 12.9, MCV 82.1, RDW 29.3, platelets 253,Ferritin: 6.7, iron saturation 14%, TIBC 433 11/17/2018: Hemoglobin 12.8, MCV 78, RDW 36.5, reticulocyte count 0.8% 11/11/2018: Hemoglobin 11.8, MCV 76.3, RDW 38 platelets 219 10/15/2018: Hemoglobin 6.9, MCV 59, RDW 19.7, ferritin 2, iron saturation 2%, TIBC 490 06/20/2019: Hb: 14.1, MCV 89, Ferritin 182, Iron sat 36%  Differential diagnosis:Blood loss versus malabsorption Prior treatment: IV iron April 2020, July 2020, Oct 2020  Lab review: Excellent response to IV Iron given in October. RTC in 3 months with labs and follow up

## 2019-06-22 ENCOUNTER — Telehealth: Payer: Self-pay | Admitting: Hematology and Oncology

## 2019-06-22 NOTE — Telephone Encounter (Signed)
I talk with patient regarding schedule  

## 2019-06-27 ENCOUNTER — Ambulatory Visit: Payer: Medicaid Other | Admitting: Hematology and Oncology

## 2019-06-27 ENCOUNTER — Other Ambulatory Visit: Payer: Medicaid Other

## 2019-07-04 ENCOUNTER — Telehealth: Payer: Self-pay | Admitting: Hematology and Oncology

## 2019-07-04 ENCOUNTER — Telehealth: Payer: Self-pay | Admitting: *Deleted

## 2019-07-04 ENCOUNTER — Other Ambulatory Visit: Payer: Self-pay | Admitting: *Deleted

## 2019-07-04 DIAGNOSIS — D509 Iron deficiency anemia, unspecified: Secondary | ICD-10-CM

## 2019-07-04 NOTE — Telephone Encounter (Signed)
Scheduled appt per 12/22 sch message- unable to reach pt . Left message with appt date and time   

## 2019-07-04 NOTE — Telephone Encounter (Signed)
Labs ordered by Dr Lindi Adie. Patient notifed

## 2019-07-04 NOTE — Telephone Encounter (Signed)
Pt is requesting Dr Lindi Adie to order labs to check for Rheumatoid Arthritis.   States she saw an orthopedist. They were going to refer her to a rheumatologist for evaluation, but appt is not available for months.  Having a lot of pain in hands, thumb joint is very swollen.

## 2019-07-05 ENCOUNTER — Inpatient Hospital Stay: Payer: Medicaid Other

## 2019-07-05 ENCOUNTER — Other Ambulatory Visit: Payer: Self-pay

## 2019-07-05 DIAGNOSIS — D509 Iron deficiency anemia, unspecified: Secondary | ICD-10-CM | POA: Diagnosis not present

## 2019-07-05 LAB — CBC WITH DIFFERENTIAL (CANCER CENTER ONLY)
Abs Immature Granulocytes: 0.02 10*3/uL (ref 0.00–0.07)
Basophils Absolute: 0.1 10*3/uL (ref 0.0–0.1)
Basophils Relative: 1 %
Eosinophils Absolute: 0.1 10*3/uL (ref 0.0–0.5)
Eosinophils Relative: 2 %
HCT: 43.2 % (ref 36.0–46.0)
Hemoglobin: 14.7 g/dL (ref 12.0–15.0)
Immature Granulocytes: 0 %
Lymphocytes Relative: 21 %
Lymphs Abs: 1.9 10*3/uL (ref 0.7–4.0)
MCH: 30.4 pg (ref 26.0–34.0)
MCHC: 34 g/dL (ref 30.0–36.0)
MCV: 89.3 fL (ref 80.0–100.0)
Monocytes Absolute: 0.8 10*3/uL (ref 0.1–1.0)
Monocytes Relative: 9 %
Neutro Abs: 6 10*3/uL (ref 1.7–7.7)
Neutrophils Relative %: 67 %
Platelet Count: 276 10*3/uL (ref 150–400)
RBC: 4.84 MIL/uL (ref 3.87–5.11)
RDW: 12.3 % (ref 11.5–15.5)
WBC Count: 9 10*3/uL (ref 4.0–10.5)
nRBC: 0 % (ref 0.0–0.2)

## 2019-07-05 LAB — IRON AND TIBC
Iron: 65 ug/dL (ref 41–142)
Saturation Ratios: 28 % (ref 21–57)
TIBC: 235 ug/dL — ABNORMAL LOW (ref 236–444)
UIBC: 170 ug/dL (ref 120–384)

## 2019-07-05 LAB — FERRITIN: Ferritin: 151 ng/mL (ref 11–307)

## 2019-07-06 LAB — RHEUMATOID FACTOR: Rheumatoid fact SerPl-aCnc: <10 IU/mL (ref 0.0–13.9)

## 2019-07-06 LAB — CYCLIC CITRUL PEPTIDE ANTIBODY, IGG/IGA: CCP Antibodies IgG/IgA: 8 units (ref 0–19)

## 2019-07-21 ENCOUNTER — Telehealth: Payer: Self-pay

## 2019-07-21 NOTE — Telephone Encounter (Signed)
Pt called to follow up with referral process to Dr. Sharl Ma at [facility] for Endocrinology.   Referral placed, however clinic did not receive.    RN successfully re-faxed referral to [phone].  Pt is aware.

## 2019-08-02 ENCOUNTER — Inpatient Hospital Stay: Payer: Medicaid Other | Attending: Hematology and Oncology

## 2019-08-03 ENCOUNTER — Telehealth: Payer: Self-pay | Admitting: Hematology and Oncology

## 2019-08-03 NOTE — Telephone Encounter (Signed)
Returned patient's phone call regarding rescheduling 01/20 and 01/22 appointment, per patient's request appointment has moved to 01/29 and 02/01.

## 2019-08-04 ENCOUNTER — Inpatient Hospital Stay: Payer: Medicaid Other | Admitting: Hematology and Oncology

## 2019-08-04 ENCOUNTER — Other Ambulatory Visit: Payer: Medicaid Other

## 2019-08-11 ENCOUNTER — Inpatient Hospital Stay: Payer: Medicaid Other

## 2019-08-14 ENCOUNTER — Inpatient Hospital Stay: Payer: Medicaid Other | Attending: Hematology and Oncology | Admitting: Hematology and Oncology

## 2019-08-14 NOTE — Assessment & Plan Note (Deleted)
12/14/2018 labs: Hemoglobin 12.9, MCV 82.1, RDW 29.3, platelets 253,Ferritin: 6.7, iron saturation 14%, TIBC 433 11/17/2018: Hemoglobin 12.8, MCV 78, RDW 36.5, reticulocyte count 0.8% 11/11/2018: Hemoglobin 11.8, MCV 76.3, RDW 38 platelets 219 10/15/2018: Hemoglobin 6.9, MCV 59, RDW 19.7, ferritin 2, iron saturation 2%, TIBC 490 06/20/2019: Hb: 14.1, MCV 89, Ferritin 182, Iron sat 36% 07/05/2019: Hb 14.7, MCV: 89, Ferritin: 151, Iron Sat: 28%   Differential diagnosis:Blood loss versus malabsorption Prior treatment: IV iron April 2020, July 2020, Oct 2020

## 2019-09-14 ENCOUNTER — Telehealth: Payer: Self-pay | Admitting: Hematology and Oncology

## 2019-09-14 NOTE — Telephone Encounter (Signed)
Cancelled 3/5 and 3/9 appts. Called and spoke with pt, confirmed cancelled appts. Pt will call back to schedule at a later date

## 2019-09-15 ENCOUNTER — Inpatient Hospital Stay: Payer: Medicaid Other

## 2019-09-18 ENCOUNTER — Telehealth: Payer: Self-pay

## 2019-09-18 NOTE — Telephone Encounter (Signed)
RN returned call, voicemail left for return call.  

## 2019-09-19 ENCOUNTER — Ambulatory Visit: Payer: Medicaid Other | Admitting: Hematology and Oncology

## 2019-09-20 NOTE — Progress Notes (Signed)
Patient Care Team: Meredith [name] as PCP - General (Physician Assistant)  DIAGNOSIS:    ICD-10-CM   1. Iron deficiency anemia, unspecified iron deficiency anemia type  D50.9 Iron and TIBC    Folate, Serum    CBC with Differential (Cancer Center Only)    CHIEF COMPLIANT: Follow-up of iron deficiency anemia  INTERVAL HISTORY: Meredith [name] is a 39 y.o. with above-mentioned history of iron deficiency anemiapreviously treated with IV iron. She presentsto the clinictoday for follow-up. She has extreme fatigue in spite of normal hemoglobin and normal iron studies.  She went to [facility] and saw hematology there.  Work-up revealed B12 deficiency with a B12 level of 180.  They prescribed oral B12 replacement therapy.  She is here today to discuss the results of the blood work at [facility] and to come up with a treatment plan.  She tells me that she feels so tired that she can take 3 to 4-hour naps multiple times through the day.  Her mother has been helping her with her kids.  ALLERGIES:  is allergic to morphine and related and promethazine hcl.  PHYSICAL EXAMINATION: ECOG PERFORMANCE STATUS: 2 - Symptomatic, <50% confined to bed  There were no vitals filed for this visit. There were no vitals filed for this visit.  LABORATORY DATA:  I have reviewed the data as listed CMP Latest Ref Rng & Units 07/15/2014 10/27/2012 08/06/2011  Glucose 70 - 99 mg/dL 712(W) 89 580(D)  BUN 6 - 23 mg/dL 17 15 7   Creatinine 0.50 - 1.10 mg/dL 9.83 3.82  Sodium 135 - 145 mmol/L 135 141 138  Potassium 3.5 - 5.1 mmol/L 3.6 4.7 3.2(L)  Chloride 96 - 112 mEq/L 105 105 105  CO2 19 - 32 mmol/L 25 26 25   Calcium 8.4 - 10.5 mg/dL 9.1 9.7 5.05)  Total Protein 6.0 - 8.3 g/dL 8.1 7.2 -  Total Bilirubin 0.3 - 1.2 mg/dL 0.8 0.5 -  Alkaline Phos 39 - 117 U/L 76 73 -  AST 0 - 37 U/L 21 20 -  ALT 0 - 35 U/L 24 31 -    Lab Results  Component Value Date   WBC 9.0  07/05/2019   HGB 14.7 07/05/2019   HCT 43.2 07/05/2019   MCV 89.3 07/05/2019   PLT 276 07/05/2019   NEUTROABS 6.0 07/05/2019    ASSESSMENT & PLAN:  Iron deficiency anemia 12/14/2018 labs: Hemoglobin 12.9, MCV 82.1, RDW 29.3, platelets 253,Ferritin: 6.7, iron saturation 14%, TIBC 433 11/17/2018: Hemoglobin 12.8, MCV 78, RDW 36.5, reticulocyte count 0.8% 11/11/2018: Hemoglobin 11.8, MCV 76.3, RDW 38 platelets 219 10/15/2018: Hemoglobin 6.9, MCV 59, RDW 19.7, ferritin 2, iron saturation 2%, TIBC 490 06/20/2019: Hb: 14.1, MCV 89, Ferritin 182, Iron sat 36%  Differential diagnosis:Blood loss versus malabsorption Prior treatment: IV iron April 2020, July 2020, Oct 2020  Lab review: Severe fatigue: B12 level at [facility] 180, ferritin 60 Plan is to administer B12 injections weekly x4 followed by once a month. We will also check copper, selenium and zinc levels.  I will see her back in 3 months with injections and labs. We will also check for pernicious anemia with antiintrinsic factor antibody and antiparietal cell antibody.   Orders Placed This Encounter  Procedures  . Iron and TIBC    Standing Status:   Future    Standing Expiration Date:   09/20/2020  . Folate, Serum    Standing Status:   Future  Standing Expiration Date:   09/20/2020  . CBC with Differential (Cancer Center Only)    Standing Status:   Future    Standing Expiration Date:   09/20/2020   The patient has a good understanding of the overall plan. she agrees with it. she will call with any problems that may develop before the next visit here.  Total time spent: 30 mins including face to face time and time spent for planning, charting and coordination of care  Nicholas Lose, MD 09/21/2019  I, Cloyde Reams Dorshimer, am acting as scribe for Dr. Nicholas Lose.  I have reviewed the above documentation for accuracy and completeness, and I agree with the above.

## 2019-09-21 ENCOUNTER — Other Ambulatory Visit: Payer: Self-pay

## 2019-09-21 ENCOUNTER — Inpatient Hospital Stay: Payer: Medicaid Other | Attending: Hematology and Oncology | Admitting: Hematology and Oncology

## 2019-09-21 ENCOUNTER — Inpatient Hospital Stay: Payer: Medicaid Other

## 2019-09-21 DIAGNOSIS — E538 Deficiency of other specified B group vitamins: Secondary | ICD-10-CM | POA: Diagnosis not present

## 2019-09-21 DIAGNOSIS — D509 Iron deficiency anemia, unspecified: Secondary | ICD-10-CM

## 2019-09-21 LAB — CBC WITH DIFFERENTIAL (CANCER CENTER ONLY)
Abs Immature Granulocytes: 0.02 10*3/uL (ref 0.00–0.07)
Basophils Absolute: 0.1 10*3/uL (ref 0.0–0.1)
Basophils Relative: 1 %
Eosinophils Absolute: 0.2 10*3/uL (ref 0.0–0.5)
Eosinophils Relative: 2 %
HCT: 41.9 % (ref 36.0–46.0)
Hemoglobin: 13.6 g/dL (ref 12.0–15.0)
Immature Granulocytes: 0 %
Lymphocytes Relative: 28 %
Lymphs Abs: 2.5 10*3/uL (ref 0.7–4.0)
MCH: 30.3 pg (ref 26.0–34.0)
MCHC: 32.5 g/dL (ref 30.0–36.0)
MCV: 93.3 fL (ref 80.0–100.0)
Monocytes Absolute: 0.6 10*3/uL (ref 0.1–1.0)
Monocytes Relative: 7 %
Neutro Abs: 5.6 10*3/uL (ref 1.7–7.7)
Neutrophils Relative %: 62 %
Platelet Count: 322 10*3/uL (ref 150–400)
RBC: 4.49 MIL/uL (ref 3.87–5.11)
RDW: 11.9 % (ref 11.5–15.5)
WBC Count: 8.9 10*3/uL (ref 4.0–10.5)
nRBC: 0 % (ref 0.0–0.2)

## 2019-09-21 LAB — FOLATE: Folate: 8.3 ng/mL (ref >5.9)

## 2019-09-21 NOTE — Assessment & Plan Note (Signed)
12/14/2018 labs: Hemoglobin 12.9, MCV 82.1, RDW 29.3, platelets 253,Ferritin: 6.7, iron saturation 14%, TIBC 433 11/17/2018: Hemoglobin 12.8, MCV 78, RDW 36.5, reticulocyte count 0.8% 11/11/2018: Hemoglobin 11.8, MCV 76.3, RDW 38 platelets 219 10/15/2018: Hemoglobin 6.9, MCV 59, RDW 19.7, ferritin 2, iron saturation 2%, TIBC 490 06/20/2019: Hb: 14.1, MCV 89, Ferritin 182, Iron sat 36%  Differential diagnosis:Blood loss versus malabsorption Prior treatment: IV iron April 2020, July 2020, Oct 2020  Lab review: Severe fatigue: On B12 5000 mcg daily Return to clinic in 3 months with labs and follow-up

## 2019-09-22 LAB — IRON AND TIBC
Iron: 46 ug/dL (ref 41–142)
Saturation Ratios: 17 % — ABNORMAL LOW (ref 21–57)
TIBC: 276 ug/dL (ref 236–444)
UIBC: 231 ug/dL (ref 120–384)

## 2019-09-22 LAB — ANTI-PARIETAL ANTIBODY: Parietal Cell Antibody-IgG: 29.3 Units — ABNORMAL HIGH (ref 0.0–20.0)

## 2019-09-22 LAB — INTRINSIC FACTOR ANTIBODIES: Intrinsic Factor: 1.1 AU/mL (ref 0.0–1.1)

## 2019-09-22 LAB — FERRITIN: Ferritin: 50 ng/mL (ref 11–307)

## 2019-09-22 LAB — SELENIUM SERUM: Selenium: 129 ug/L (ref 93–198)

## 2019-09-23 LAB — COPPER, SERUM: Copper: 108 ug/dL (ref 80–158)

## 2019-09-23 LAB — ZINC: Zinc: 71 ug/dL (ref 44–115)

## 2019-09-25 ENCOUNTER — Telehealth: Payer: Self-pay | Admitting: Hematology and Oncology

## 2019-09-25 NOTE — Telephone Encounter (Signed)
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 Recommendation: Lifelong B12 supplementation. Injections She has an appointment to see her gastroenterologist. She will start B12 injections very shortly and we will hope to see that her energy levels improve.

## 2019-09-26 ENCOUNTER — Other Ambulatory Visit: Payer: Self-pay | Admitting: Hematology and Oncology

## 2019-09-26 ENCOUNTER — Telehealth: Payer: Self-pay | Admitting: *Deleted

## 2019-09-26 MED ORDER — DICYCLOMINE HCL 10 MG PO CAPS: 10.0000 mg | ORAL_CAPSULE | Freq: Three times a day (TID) | ORAL | 0 refills | Status: DC

## 2019-09-26 NOTE — Telephone Encounter (Signed)
Received call from pt stating she is experiencing severe abdominal cramping and nausea x2 days.  Pt states nausea is worse after eating.  Per MD pt to take Bentyl 10 mg p.o 4 times a day.  If symptoms persist pt to follow up with PCP. RN educated pt to eat a bland diet and to increase oral intake of water. Pt verbalized understanding.

## 2019-09-27 ENCOUNTER — Telehealth: Payer: Self-pay | Admitting: Hematology and Oncology

## 2019-09-27 NOTE — Telephone Encounter (Signed)
Scheduled per 03/12 los, patient has been called and notified. 

## 2019-09-28 ENCOUNTER — Other Ambulatory Visit: Payer: Self-pay

## 2019-09-28 ENCOUNTER — Inpatient Hospital Stay: Payer: Medicaid Other

## 2019-09-28 DIAGNOSIS — E538 Deficiency of other specified B group vitamins: Secondary | ICD-10-CM | POA: Diagnosis not present

## 2019-09-28 MED ORDER — CYANOCOBALAMIN 1000 MCG/ML IJ SOLN
1000.0000 ug | Freq: Once | INTRAMUSCULAR | Status: AC
  Administered 2019-09-28: 1000 ug via INTRAMUSCULAR

## 2019-09-28 NOTE — Patient Instructions (Signed)
Cyanocobalamin, Pyridoxine, and Folate What is this medicine? A multivitamin containing folic acid, vitamin B6, and vitamin B12. This medicine may be used for other purposes; ask your health care provider or pharmacist if you have questions. COMMON BRAND NAME(S): AllanFol RX, AllanTex, Av-Vite FB, B Complex with Folic Acid, ComBgen, FaBB, Folamin, Folastin, Folbalin, Folbee, Folbic, Folcaps, Folgard, Folgard RX, Folgard RX 2.2, Folplex, Folplex 2.2, Foltabs 800, Foltx, Homocysteine Formula, Niva-Fol, NuFol, TL Gard RX, Virt-Gard, Virt-Vite, Virt-Vite Forte, Vita-Respa What should I tell my health care provider before I take this medicine? They need to know if you have any of these conditions:  bleeding or clotting disorder  history of anemia of any type  other chronic health condition  an unusual or allergic reaction to vitamins, other medicines, foods, dyes, or preservatives  pregnant or trying to get pregnant  breast-feeding How should I use this medicine? Take by mouth with a glass of water. May take with food. Follow the directions on the prescription label. It is usually given once a day. Do not take your medicine more often than directed. Contact your pediatrician regarding the use of this medicine in children. Special care may be needed. Overdosage: If you think you have taken too much of this medicine contact a poison control center or emergency room at once. NOTE: This medicine is only for you. Do not share this medicine with others. What if I miss a dose? If you miss a dose, take it as soon as you can. If it is almost time for your next dose, take only that dose. Do not take double or extra doses. What may interact with this medicine?  levodopa This list may not describe all possible interactions. Give your health care provider a list of all the medicines, herbs, non-prescription drugs, or dietary supplements you use. Also tell them if you smoke, drink alcohol, or use illegal  drugs. Some items may interact with your medicine. What should I watch for while using this medicine? See your health care professional for regular checks on your progress. Remember that vitamin supplements do not replace the need for good nutrition from a balanced diet. What side effects may I notice from receiving this medicine? Side effects that you should report to your doctor or health care professional as soon as possible:  allergic reaction such as skin rash or difficulty breathing  vomiting Side effects that usually do not require medical attention (report to your doctor or health care professional if they continue or are bothersome):  nausea  stomach upset This list may not describe all possible side effects. Call your doctor for medical advice about side effects. You may report side effects to FDA at 1-800-FDA-1088. Where should I keep my medicine? Keep out of the reach of children. Most vitamins should be stored at controlled room temperature. Check your specific product directions. Protect from heat and moisture. Throw away any unused medicine after the expiration date. NOTE: This sheet is a summary. It may not cover all possible information. If you have questions about this medicine, talk to your doctor, pharmacist, or health care provider.  2020 Elsevier/Gold Standard (2007-08-20 00:59:55)  

## 2019-10-05 ENCOUNTER — Ambulatory Visit: Payer: Medicaid Other

## 2019-10-06 ENCOUNTER — Inpatient Hospital Stay: Payer: Medicaid Other

## 2019-10-06 ENCOUNTER — Other Ambulatory Visit: Payer: Self-pay

## 2019-10-06 VITALS — BP 107/56 | HR 79 | Temp 98.7°F | Resp 18

## 2019-10-06 DIAGNOSIS — E538 Deficiency of other specified B group vitamins: Secondary | ICD-10-CM

## 2019-10-06 MED ORDER — CYANOCOBALAMIN 1000 MCG/ML IJ SOLN
1000.0000 ug | Freq: Once | INTRAMUSCULAR | Status: AC
  Administered 2019-10-06: 1000 ug via INTRAMUSCULAR

## 2019-10-06 NOTE — Patient Instructions (Signed)
Cyanocobalamin, Pyridoxine, and Folate What is this medicine? A multivitamin containing folic acid, vitamin B6, and vitamin B12. This medicine may be used for other purposes; ask your health care provider or pharmacist if you have questions. COMMON BRAND NAME(S): AllanFol RX, AllanTex, Av-Vite FB, B Complex with Folic Acid, ComBgen, FaBB, Folamin, Folastin, Folbalin, Folbee, Folbic, Folcaps, Folgard, Folgard RX, Folgard RX 2.2, Folplex, Folplex 2.2, Foltabs 800, Foltx, Homocysteine Formula, Niva-Fol, NuFol, TL Gard RX, Virt-Gard, Virt-Vite, Virt-Vite Forte, Vita-Respa What should I tell my health care provider before I take this medicine? They need to know if you have any of these conditions:  bleeding or clotting disorder  history of anemia of any type  other chronic health condition  an unusual or allergic reaction to vitamins, other medicines, foods, dyes, or preservatives  pregnant or trying to get pregnant  breast-feeding How should I use this medicine? Take by mouth with a glass of water. May take with food. Follow the directions on the prescription label. It is usually given once a day. Do not take your medicine more often than directed. Contact your pediatrician regarding the use of this medicine in children. Special care may be needed. Overdosage: If you think you have taken too much of this medicine contact a poison control center or emergency room at once. NOTE: This medicine is only for you. Do not share this medicine with others. What if I miss a dose? If you miss a dose, take it as soon as you can. If it is almost time for your next dose, take only that dose. Do not take double or extra doses. What may interact with this medicine?  levodopa This list may not describe all possible interactions. Give your health care provider a list of all the medicines, herbs, non-prescription drugs, or dietary supplements you use. Also tell them if you smoke, drink alcohol, or use illegal  drugs. Some items may interact with your medicine. What should I watch for while using this medicine? See your health care professional for regular checks on your progress. Remember that vitamin supplements do not replace the need for good nutrition from a balanced diet. What side effects may I notice from receiving this medicine? Side effects that you should report to your doctor or health care professional as soon as possible:  allergic reaction such as skin rash or difficulty breathing  vomiting Side effects that usually do not require medical attention (report to your doctor or health care professional if they continue or are bothersome):  nausea  stomach upset This list may not describe all possible side effects. Call your doctor for medical advice about side effects. You may report side effects to FDA at 1-800-FDA-1088. Where should I keep my medicine? Keep out of the reach of children. Most vitamins should be stored at controlled room temperature. Check your specific product directions. Protect from heat and moisture. Throw away any unused medicine after the expiration date. NOTE: This sheet is a summary. It may not cover all possible information. If you have questions about this medicine, talk to your doctor, pharmacist, or health care provider.  2020 Elsevier/Gold Standard (2007-08-20 00:59:55)  

## 2019-10-12 ENCOUNTER — Telehealth: Payer: Self-pay

## 2019-10-12 ENCOUNTER — Other Ambulatory Visit: Payer: Self-pay

## 2019-10-12 ENCOUNTER — Inpatient Hospital Stay: Payer: Medicaid Other | Attending: Hematology and Oncology

## 2019-10-12 VITALS — BP 122/73 | HR 62 | Temp 98.0°F | Resp 18

## 2019-10-12 DIAGNOSIS — E538 Deficiency of other specified B group vitamins: Secondary | ICD-10-CM | POA: Diagnosis present

## 2019-10-12 MED ORDER — CYANOCOBALAMIN 1000 MCG/ML IJ SOLN
1000.0000 ug | Freq: Once | INTRAMUSCULAR | Status: AC
  Administered 2019-10-12: 1000 ug via INTRAMUSCULAR

## 2019-10-12 MED ORDER — CYANOCOBALAMIN 1000 MCG/ML IJ SOLN
INTRAMUSCULAR | Status: AC
  Filled 2019-10-12: qty 1

## 2019-10-12 NOTE — Telephone Encounter (Signed)
She called to report that she is having right foot pain and right hand pain at times. Her right hand grip was weak last night. She usually has the right leg pain. She takes Advil for the pain. She wanted Dr. Karie Soda to be aware. Told her Dr. Karie Soda is out of the office this week and I would give the message to him. She will PCP tomorrow and report the above.  Just FYI

## 2019-10-12 NOTE — Patient Instructions (Signed)
Cyanocobalamin, Pyridoxine, and Folate What is this medicine? A multivitamin containing folic acid, vitamin B6, and vitamin B12. This medicine may be used for other purposes; ask your health care provider or pharmacist if you have questions. COMMON BRAND NAME(S): AllanFol RX, AllanTex, Av-Vite FB, B Complex with Folic Acid, ComBgen, FaBB, Folamin, Folastin, Folbalin, Folbee, Folbic, Folcaps, Folgard, Folgard RX, Folgard RX 2.2, Folplex, Folplex 2.2, Foltabs 800, Foltx, Homocysteine Formula, Niva-Fol, NuFol, TL Gard RX, Virt-Gard, Virt-Vite, Virt-Vite Forte, Vita-Respa What should I tell my health care provider before I take this medicine? They need to know if you have any of these conditions:  bleeding or clotting disorder  history of anemia of any type  other chronic health condition  an unusual or allergic reaction to vitamins, other medicines, foods, dyes, or preservatives  pregnant or trying to get pregnant  breast-feeding How should I use this medicine? Take by mouth with a glass of water. May take with food. Follow the directions on the prescription label. It is usually given once a day. Do not take your medicine more often than directed. Contact your pediatrician regarding the use of this medicine in children. Special care may be needed. Overdosage: If you think you have taken too much of this medicine contact a poison control center or emergency room at once. NOTE: This medicine is only for you. Do not share this medicine with others. What if I miss a dose? If you miss a dose, take it as soon as you can. If it is almost time for your next dose, take only that dose. Do not take double or extra doses. What may interact with this medicine?  levodopa This list may not describe all possible interactions. Give your health care provider a list of all the medicines, herbs, non-prescription drugs, or dietary supplements you use. Also tell them if you smoke, drink alcohol, or use illegal  drugs. Some items may interact with your medicine. What should I watch for while using this medicine? See your health care professional for regular checks on your progress. Remember that vitamin supplements do not replace the need for good nutrition from a balanced diet. What side effects may I notice from receiving this medicine? Side effects that you should report to your doctor or health care professional as soon as possible:  allergic reaction such as skin rash or difficulty breathing  vomiting Side effects that usually do not require medical attention (report to your doctor or health care professional if they continue or are bothersome):  nausea  stomach upset This list may not describe all possible side effects. Call your doctor for medical advice about side effects. You may report side effects to FDA at 1-800-FDA-1088. Where should I keep my medicine? Keep out of the reach of children. Most vitamins should be stored at controlled room temperature. Check your specific product directions. Protect from heat and moisture. Throw away any unused medicine after the expiration date. NOTE: This sheet is a summary. It may not cover all possible information. If you have questions about this medicine, talk to your doctor, pharmacist, or health care provider.  2020 Elsevier/Gold Standard (2007-08-20 00:59:55)  

## 2019-10-19 ENCOUNTER — Inpatient Hospital Stay: Payer: Medicaid Other

## 2019-10-19 ENCOUNTER — Telehealth: Payer: Self-pay | Admitting: Hematology and Oncology

## 2019-10-19 NOTE — Telephone Encounter (Signed)
Called pt per 4/8 vmail - no answer - left message for pt to call back for reschedule

## 2019-10-26 ENCOUNTER — Telehealth: Payer: Self-pay | Admitting: *Deleted

## 2019-10-26 ENCOUNTER — Other Ambulatory Visit: Payer: Self-pay | Admitting: *Deleted

## 2019-10-26 DIAGNOSIS — D509 Iron deficiency anemia, unspecified: Secondary | ICD-10-CM

## 2019-10-26 NOTE — Telephone Encounter (Signed)
Received a call from pt stating she has not noticed any improvements since receiving vitamin b 12 injection.  Pt states she is constantly fatigued and has no appetitie. Per MD pt to have iron studies drawn and f/u with MD for an assessment.  Appointments scheduled and pt verbalized understanding.

## 2019-10-27 ENCOUNTER — Inpatient Hospital Stay: Payer: Medicaid Other

## 2019-10-27 ENCOUNTER — Other Ambulatory Visit: Payer: Self-pay

## 2019-10-27 VITALS — BP 122/78 | HR 72 | Temp 98.2°F | Resp 18

## 2019-10-27 DIAGNOSIS — E538 Deficiency of other specified B group vitamins: Secondary | ICD-10-CM

## 2019-10-27 DIAGNOSIS — D509 Iron deficiency anemia, unspecified: Secondary | ICD-10-CM

## 2019-10-27 LAB — CBC WITH DIFFERENTIAL (CANCER CENTER ONLY)
Abs Immature Granulocytes: 0.02 10*3/uL (ref 0.00–0.07)
Basophils Absolute: 0.1 10*3/uL (ref 0.0–0.1)
Basophils Relative: 1 %
Eosinophils Absolute: 0.1 10*3/uL (ref 0.0–0.5)
Eosinophils Relative: 2 %
HCT: 44.8 % (ref 36.0–46.0)
Hemoglobin: 14.7 g/dL (ref 12.0–15.0)
Immature Granulocytes: 0 %
Lymphocytes Relative: 24 %
Lymphs Abs: 2 10*3/uL (ref 0.7–4.0)
MCH: 29.2 pg (ref 26.0–34.0)
MCHC: 32.8 g/dL (ref 30.0–36.0)
MCV: 89.1 fL (ref 80.0–100.0)
Monocytes Absolute: 0.6 10*3/uL (ref 0.1–1.0)
Monocytes Relative: 7 %
Neutro Abs: 5.6 10*3/uL (ref 1.7–7.7)
Neutrophils Relative %: 66 %
Platelet Count: 272 10*3/uL (ref 150–400)
RBC: 5.03 MIL/uL (ref 3.87–5.11)
RDW: 11.8 % (ref 11.5–15.5)
WBC Count: 8.4 10*3/uL (ref 4.0–10.5)
nRBC: 0 % (ref 0.0–0.2)

## 2019-10-27 LAB — CMP (CANCER CENTER ONLY)
ALT: 11 U/L (ref 0–44)
AST: 13 U/L — ABNORMAL LOW (ref 15–41)
Albumin: 4 g/dL (ref 3.5–5.0)
Alkaline Phosphatase: 69 U/L (ref 38–126)
Anion gap: 8 (ref 5–15)
BUN: 11 mg/dL (ref 6–20)
CO2: 27 mmol/L (ref 22–32)
Calcium: 9.1 mg/dL (ref 8.9–10.3)
Chloride: 105 mmol/L (ref 98–111)
Creatinine: 0.79 mg/dL (ref 0.44–1.00)
GFR, Est AFR Am: >60 mL/min (ref >60)
GFR, Estimated: >60 mL/min (ref >60)
Glucose, Bld: 97 mg/dL (ref 70–99)
Potassium: 4.1 mmol/L (ref 3.5–5.1)
Sodium: 140 mmol/L (ref 135–145)
Total Bilirubin: 0.5 mg/dL (ref 0.3–1.2)
Total Protein: 7.3 g/dL (ref 6.5–8.1)

## 2019-10-27 LAB — IRON AND TIBC
Iron: 84 ug/dL (ref 41–142)
Saturation Ratios: 31 % (ref 21–57)
TIBC: 273 ug/dL (ref 236–444)
UIBC: 188 ug/dL (ref 120–384)

## 2019-10-27 LAB — FERRITIN: Ferritin: 45 ng/mL (ref 11–307)

## 2019-10-27 MED ORDER — CYANOCOBALAMIN 1000 MCG/ML IJ SOLN
1000.0000 ug | Freq: Once | INTRAMUSCULAR | Status: AC
  Administered 2019-10-27: 1000 ug via INTRAMUSCULAR

## 2019-10-27 MED ORDER — CYANOCOBALAMIN 1000 MCG/ML IJ SOLN
INTRAMUSCULAR | Status: AC
  Filled 2019-10-27: qty 1

## 2019-10-27 NOTE — Patient Instructions (Signed)
Cyanocobalamin, Pyridoxine, and Folate What is this medicine? A multivitamin containing folic acid, vitamin B6, and vitamin B12. This medicine may be used for other purposes; ask your health care provider or pharmacist if you have questions. COMMON BRAND NAME(S): AllanFol RX, AllanTex, Av-Vite FB, B Complex with Folic Acid, ComBgen, FaBB, Folamin, Folastin, Folbalin, Folbee, Folbic, Folcaps, Folgard, Folgard RX, Folgard RX 2.2, Folplex, Folplex 2.2, Foltabs 800, Foltx, Homocysteine Formula, Niva-Fol, NuFol, TL Gard RX, Virt-Gard, Virt-Vite, Virt-Vite Forte, Vita-Respa What should I tell my health care provider before I take this medicine? They need to know if you have any of these conditions:  bleeding or clotting disorder  history of anemia of any type  other chronic health condition  an unusual or allergic reaction to vitamins, other medicines, foods, dyes, or preservatives  pregnant or trying to get pregnant  breast-feeding How should I use this medicine? Take by mouth with a glass of water. May take with food. Follow the directions on the prescription label. It is usually given once a day. Do not take your medicine more often than directed. Contact your pediatrician regarding the use of this medicine in children. Special care may be needed. Overdosage: If you think you have taken too much of this medicine contact a poison control center or emergency room at once. NOTE: This medicine is only for you. Do not share this medicine with others. What if I miss a dose? If you miss a dose, take it as soon as you can. If it is almost time for your next dose, take only that dose. Do not take double or extra doses. What may interact with this medicine?  levodopa This list may not describe all possible interactions. Give your health care provider a list of all the medicines, herbs, non-prescription drugs, or dietary supplements you use. Also tell them if you smoke, drink alcohol, or use illegal  drugs. Some items may interact with your medicine. What should I watch for while using this medicine? See your health care professional for regular checks on your progress. Remember that vitamin supplements do not replace the need for good nutrition from a balanced diet. What side effects may I notice from receiving this medicine? Side effects that you should report to your doctor or health care professional as soon as possible:  allergic reaction such as skin rash or difficulty breathing  vomiting Side effects that usually do not require medical attention (report to your doctor or health care professional if they continue or are bothersome):  nausea  stomach upset This list may not describe all possible side effects. Call your doctor for medical advice about side effects. You may report side effects to FDA at 1-800-FDA-1088. Where should I keep my medicine? Keep out of the reach of children. Most vitamins should be stored at controlled room temperature. Check your specific product directions. Protect from heat and moisture. Throw away any unused medicine after the expiration date. NOTE: This sheet is a summary. It may not cover all possible information. If you have questions about this medicine, talk to your doctor, pharmacist, or health care provider.  2020 Elsevier/Gold Standard (2007-08-20 00:59:55)  

## 2019-10-29 NOTE — Progress Notes (Signed)
Patient Care Team: [name] as PCP - General (Physician Assistant)  DIAGNOSIS:    ICD-10-CM   1. B12 deficiency  E53.8   2. Iron deficiency anemia, unspecified iron deficiency anemia type  D50.9   3. Weight loss, unintentional  R63.4 CT Chest W Contrast    CT Abdomen Pelvis W Contrast    CHIEF COMPLIANT: Follow-up of pernicious anemiaand B12 deficiency   INTERVAL HISTORY: Meredith [name] is a 39 y.o. 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 Labs on 10/27/19 showed: Hg 14.7, HCT 44.8, iron saturation 31%, ferritin 45. She presents to the clinic today for follow-up due to persistent severe fatigue and lack of appetite after having received 4 doses of vitamin B12 injections.  In spite of weekly B12 injections she has not felt any better.  In fact she tells me she feels worse.  She is also continuing to lose weight.  She has no appetite.  She and her mother are both extremely worried about her health.  She has an appointment to see endocrinology next Tuesday.  ALLERGIES:  is allergic to morphine and related and promethazine hcl.  MEDICATIONS:  Current Outpatient Medications  Medication Sig Dispense Refill  . dicyclomine (BENTYL) 10 MG capsule Take 1 capsule (10 mg total) by mouth 4 (four) times daily -  before meals and at bedtime. 30 capsule 0   No current facility-administered medications for this visit.    PHYSICAL EXAMINATION: ECOG PERFORMANCE STATUS: 1 - Symptomatic but completely ambulatory  Vitals:   10/30/19 1450  BP: 110/71  Pulse: 71  Resp: 19  Temp: 98.5 F (36.9 C)  SpO2: 100%   Filed Weights   10/30/19 1450  Weight: 160 lb 9.6 oz (72.8 kg)    LABORATORY DATA:  I have reviewed the data as listed CMP Latest Ref Rng & Units 10/27/2019 07/15/2014 10/27/2012  Glucose 70 - 99 mg/dL 97 423(N) 89  BUN 6 - 20 mg/dL 11 17 15   Creatinine 0.44 - 1.00 mg/dL 3.61 4.43  Sodium 135 - 145 mmol/L 140 135 141    Potassium 3.5 - 5.1 mmol/L 4.1 3.6 4.7  Chloride 98 - 111 mmol/L 105 105 105  CO2 22 - 32 mmol/L 27 25 26   Calcium 8.9 - 10.3 mg/dL 9.1 9.1 9.7  Total Protein 6.5 - 8.1 g/dL 7.3 8.1 7.2  Total Bilirubin 0.3 - 1.2 mg/dL 0.5 0.8 0.5  Alkaline Phos 38 - 126 U/L 69 76 73  AST 15 - 41 U/L 13(L) 21 20  ALT 0 - 44 U/L 11 24 31     Lab Results  Component Value Date   WBC 8.4 10/27/2019   HGB 14.7 10/27/2019   HCT 44.8 10/27/2019   MCV 89.1 10/27/2019   PLT 272 10/27/2019   NEUTROABS 5.6 10/27/2019    ASSESSMENT & PLAN:  Iron deficiency anemia B12 deficiency and iron deficiency anemias 12/14/2018 labs: Hemoglobin 12.9, MCV 82.1, RDW 29.3, platelets 253,Ferritin: 6.7, iron saturation 14%, TIBC 433 11/17/2018: Hemoglobin 12.8, MCV 78, RDW 36.5, reticulocyte count 0.8% 11/11/2018: Hemoglobin 11.8, MCV 76.3, RDW 38 platelets 219 10/15/2018: Hemoglobin 6.9, MCV 59, RDW 19.7, ferritin 2, iron saturation 2%, TIBC 490 06/20/2019: Hb: 14.1, MCV 89, Ferritin 182, Iron sat 36%  Differential diagnosis:Pernicious anemia: Antiparietal cell antibody positive (29.3 on 09/21/2019) Prior treatment: IV iron April 2020, July 2020, Oct 2020  Lab review: Selenium 129, zinc 71, copper 108 (all in the normal range) Severe fatigue: B12 level  at [facility] 180, ferritin 60 Current treatment: B12 injections weekly x4 followed by once a month.  No improvement with 4 injections of vitamin B12.  Weight loss: I recommended that we obtain CT chest abdomen pelvis with contrast for further evaluation. She has an appointment to see Dr. Buddy Duty with endocrinology 11/07/2019.  I will see her back on 11/06/2019 on a MyChart virtual visit to go over the CT scan report.    Orders Placed This Encounter  Procedures  . CT Chest W Contrast    Standing Status:   Future    Standing Expiration Date:   10/29/2020    Order Specific Question:   ** REASON FOR EXAM (FREE TEXT)    Answer:   Severe weight loss and severe fatigue     Order Specific Question:   If indicated for the ordered procedure, I authorize the administration of contrast media per Radiology protocol    Answer:   Yes    Order Specific Question:   Is patient pregnant?    Answer:   No    Order Specific Question:   Preferred imaging location?    Answer:   [facility]    Order Specific Question:   Radiology Contrast Protocol - do NOT remove file path    Answer:   \\[email or web address]  . CT Abdomen Pelvis W Contrast    Standing Status:   Future    Standing Expiration Date:   10/29/2020    Order Specific Question:   ** REASON FOR EXAM (FREE TEXT)    Answer:   Severe fatigue and weight loss    Order Specific Question:   If indicated for the ordered procedure, I authorize the administration of contrast media per Radiology protocol    Answer:   Yes    Order Specific Question:   Is patient pregnant?    Answer:   No    Order Specific Question:   Preferred imaging location?    Answer:   [facility]    Order Specific Question:   Is Oral Contrast requested for this exam?    Answer:   Yes, Per Radiology protocol    Order Specific Question:   Radiology Contrast Protocol - do NOT remove file path    Answer:   \\[email or web address]   The patient has a good understanding of the overall plan. she agrees with it. she will call with any problems that may develop before the next visit here.  Total time spent: 20 mins including face to face time and time spent for planning, charting and coordination of care  Nicholas Lose, MD 10/30/2019  I, Cloyde Reams Dorshimer, am acting as scribe for Dr. Nicholas Lose.  I have reviewed the above documentation for accuracy and completeness, and I agree with the above.

## 2019-10-30 ENCOUNTER — Other Ambulatory Visit: Payer: Self-pay

## 2019-10-30 ENCOUNTER — Inpatient Hospital Stay (HOSPITAL_BASED_OUTPATIENT_CLINIC_OR_DEPARTMENT_OTHER): Payer: Medicaid Other | Admitting: Hematology and Oncology

## 2019-10-30 VITALS — BP 110/71 | HR 71 | Temp 98.5°F | Resp 19 | Ht 62.0 in | Wt 160.6 lb

## 2019-10-30 DIAGNOSIS — R634 Abnormal weight loss: Secondary | ICD-10-CM

## 2019-10-30 DIAGNOSIS — E538 Deficiency of other specified B group vitamins: Secondary | ICD-10-CM

## 2019-10-30 DIAGNOSIS — D509 Iron deficiency anemia, unspecified: Secondary | ICD-10-CM

## 2019-10-30 NOTE — Assessment & Plan Note (Signed)
B12 deficiency and iron deficiency anemias 12/14/2018 labs: Hemoglobin 12.9, MCV 82.1, RDW 29.3, platelets 253,Ferritin: 6.7, iron saturation 14%, TIBC 433 11/17/2018: Hemoglobin 12.8, MCV 78, RDW 36.5, reticulocyte count 0.8% 11/11/2018: Hemoglobin 11.8, MCV 76.3, RDW 38 platelets 219 10/15/2018: Hemoglobin 6.9, MCV 59, RDW 19.7, ferritin 2, iron saturation 2%, TIBC 490 06/20/2019: Hb: 14.1, MCV 89, Ferritin 182, Iron sat 36%  Differential diagnosis:Pernicious anemia: Antiparietal cell antibody positive (29.3 on 09/21/2019) Prior treatment: IV iron April 2020, July 2020, Oct 2020  Lab review: Selenium 129, zinc 71, copper 108 (all in the normal range) Severe fatigue: B12 level at [facility] 180, ferritin 60 Current treatment: B12 injections weekly x4 followed by once a month.  I will see her back in 3 months with injections and labs.

## 2019-11-02 ENCOUNTER — Telehealth: Payer: Self-pay | Admitting: Hematology and Oncology

## 2019-11-02 NOTE — Telephone Encounter (Signed)
Scheduled per 04/19 los, patient has been called and notified. 

## 2019-11-07 ENCOUNTER — Inpatient Hospital Stay: Payer: Medicaid Other | Admitting: Hematology and Oncology

## 2019-11-15 ENCOUNTER — Other Ambulatory Visit: Payer: Self-pay | Admitting: Internal Medicine

## 2019-11-15 ENCOUNTER — Ambulatory Visit
Admission: RE | Admit: 2019-11-15 | Discharge: 2019-11-15 | Disposition: A | Discharge status: Home / Self Care | Payer: Medicaid Other | Source: Ambulatory Visit | Attending: Internal Medicine | Admitting: Internal Medicine

## 2019-11-15 ENCOUNTER — Telehealth: Payer: Self-pay

## 2019-11-15 DIAGNOSIS — M259 Joint disorder, unspecified: Secondary | ICD-10-CM

## 2019-11-15 NOTE — Telephone Encounter (Signed)
RN placed call to patient to follow up with recent information about GI work up.   Recently MD ordered imaging that was denied due to not meeting work up for weight loss.   Pt had endoscopy on 4.29.2021, per patient no results have been given yet.  Pt stated she was told results would take 7-10 days.   RN encouraged patient to have results faxed to clinic once notified so we can proceed with possible peer to peer per MD recommendations. Pt verbalized understanding and agreement.

## 2019-11-21 NOTE — Progress Notes (Signed)
Patient Care Team: Meredith [name] as PCP - General (Physician Assistant)  DIAGNOSIS:    ICD-10-CM   1. 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 Iron deficiency anemia, unspecified iron deficiency anemia type  D50.9   3. Autoimmune disorder (HCC)  D89.89 Ambulatory referral to Rheumatology    CHIEF COMPLIANT: Follow-up of pernicious anemiaand B12 deficiency   INTERVAL HISTORY: Meredith [name] is a 39 y.o. with above-mentioned history of iron deficiency anemia, pernicious anemia and B12 deficiency. 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 She presents to the clinic today for follow-up.  Her major complaint is a pain that goes all the way up her leg into her thigh especially on the right leg.  There is no pain or discomfort in the left leg.  She is also not had good appetite.  She has absolutely no energy.  The clinical concern is that the patient may have an autoimmune disorder causing autoimmune gastritis, neuritis, pernicious anemia etc.  There is also concern for lymphoma.  We have tried to obtain CT chest abdomen pelvis but her insurance did not authorize it.  Now that the entire work-up is done we would like to reorder the CT scans.  I suspect that the patient may also need an MRI of her lower spine because of the localization of her symptoms.  ALLERGIES:  is allergic to morphine and related and promethazine hcl.  MEDICATIONS:  Current Outpatient Medications  Medication Sig Dispense Refill  . dicyclomine (BENTYL) 10 MG capsule Take 1 capsule (10 mg total) by mouth 4 (four) times daily -  before meals and at bedtime. 30 capsule 0  . ergocalciferol (VITAMIN D2) 1.25 MG (50000 UT) capsule Take 1 capsule (50,000 Units total) by mouth once a week. 30 capsule 6   No current facility-administered medications for this visit.    PHYSICAL EXAMINATION: ECOG PERFORMANCE STATUS: 1 - Symptomatic but completely  ambulatory  Vitals:   11/22/19 1244  BP: 105/71  Pulse: 94  Resp: 18  Temp: 98 F (36.7 C)  SpO2: 99%   Filed Weights   11/22/19 1244  Weight: 162 lb 3.2 oz (73.6 kg)    LABORATORY DATA:  I have reviewed the data as listed CMP Latest Ref Rng & Units 10/27/2019 07/15/2014 10/27/2012  Glucose 70 - 99 mg/dL 97 528(U) 89  BUN 6 - 20 mg/dL 11 17 15   Creatinine 0.44 - 1.00 mg/dL 1.32 4.40  Sodium 135 - 145 mmol/L 140 135 141  Potassium 3.5 - 5.1 mmol/L 4.1 3.6 4.7  Chloride 98 - 111 mmol/L 105 105 105  CO2 22 - 32 mmol/L 27 25 26   Calcium 8.9 - 10.3 mg/dL 9.1 9.1 9.7  Total Protein 6.5 - 8.1 g/dL 7.3 8.1 7.2  Total Bilirubin 0.3 - 1.2 mg/dL 0.5 0.8 0.5  Alkaline Phos 38 - 126 U/L 69 76 73  AST 15 - 41 U/L 13(L) 21 20  ALT 0 - 44 U/L 11 24 31     Lab Results  Component Value Date   WBC 8.4 10/27/2019   HGB 14.7 10/27/2019   HCT 44.8 10/27/2019   MCV 89.1 10/27/2019   PLT 272 10/27/2019   NEUTROABS 5.6 10/27/2019    ASSESSMENT & PLAN:  Iron deficiency anemia B12 deficiency and iron deficiency anemias    Differential diagnosis: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 I recommended increasing the B12 injections to twice a week.  Vitamin D deficiency: I provided a prescription for 50,000 units of vitamin D weekly.  GI work-up: Lymphocytic gastritis. CT CAP was not authorized by her insurance.  We will do a peer to peer discussion regarding this.  I suspect that the patient might have lymphoma based on B symptoms, fatigue, weight loss,  I will refer the patient to neurology and  rheumatology because suspect that the patient may have autoimmune neuritis (neurology consult).  Patient also has known pernicious anemia and autoimmune disease.  Upper GI endoscopy and colonoscopy were completed.  We did not find any tumors. I will call her with results of CT scans and decide if she will need a spine MRI.  Severe pain in the leg: On ibuprofen as needed.    Orders Placed This Encounter  Procedures  . Ambulatory referral to Neurology    Referral Priority:   Routine    Referral Type:   Consultation    Referral Reason:   Specialty Services Required    Referred to Provider:   Penni Bombard, MD    Requested Specialty:   Neurology    Number of Visits Requested:   1  . Ambulatory referral to Rheumatology    Referral Priority:   Routine    Referral Type:   Consultation    Referral Reason:   Specialty Services Required    Referred to Provider:   Bo Merino, MD    Requested Specialty:   Rheumatology    Number of Visits Requested:   1   The patient has a good understanding of the overall plan. she agrees with it. she will call with any problems that may develop before the next visit here.  Total time spent: 30 mins including face to face time and time spent for planning, charting and coordination of care  Meredith Lose, MD 11/22/2019  I, Meredith [name], am acting as scribe for Dr. Nicholas [name].  I have reviewed the above documentation for accuracy and completeness, and I agree with the above.

## 2019-11-22 ENCOUNTER — Inpatient Hospital Stay: Payer: Medicaid Other | Attending: Hematology and Oncology

## 2019-11-22 ENCOUNTER — Other Ambulatory Visit: Payer: Self-pay | Admitting: *Deleted

## 2019-11-22 ENCOUNTER — Other Ambulatory Visit: Payer: Self-pay

## 2019-11-22 ENCOUNTER — Encounter: Payer: Self-pay | Admitting: *Deleted

## 2019-11-22 ENCOUNTER — Inpatient Hospital Stay (HOSPITAL_BASED_OUTPATIENT_CLINIC_OR_DEPARTMENT_OTHER): Payer: Medicaid Other | Admitting: Hematology and Oncology

## 2019-11-22 VITALS — BP 105/71 | HR 94 | Temp 98.0°F | Resp 18 | Ht 62.0 in | Wt 162.2 lb

## 2019-11-22 DIAGNOSIS — D8989 Other specified disorders involving the immune mechanism, not elsewhere classified: Secondary | ICD-10-CM | POA: Diagnosis not present

## 2019-11-22 DIAGNOSIS — Z79899 Other long term (current) drug therapy: Secondary | ICD-10-CM | POA: Diagnosis not present

## 2019-11-22 DIAGNOSIS — D510 Vitamin B12 deficiency anemia due to intrinsic factor deficiency: Secondary | ICD-10-CM | POA: Insufficient documentation

## 2019-11-22 DIAGNOSIS — D509 Iron deficiency anemia, unspecified: Secondary | ICD-10-CM

## 2019-11-22 DIAGNOSIS — M792 Neuralgia and neuritis, unspecified: Secondary | ICD-10-CM

## 2019-11-22 DIAGNOSIS — E538 Deficiency of other specified B group vitamins: Secondary | ICD-10-CM | POA: Insufficient documentation

## 2019-11-22 MED ORDER — ERGOCALCIFEROL 1.25 MG (50000 UT) PO CAPS
50000.0000 [IU] | ORAL_CAPSULE | ORAL | 6 refills | Status: DC
Start: 2019-11-22 — End: 2020-09-10

## 2019-11-22 MED ORDER — CYANOCOBALAMIN 1000 MCG/ML IJ SOLN
1000.0000 ug | Freq: Once | INTRAMUSCULAR | Status: AC
  Administered 2019-11-22: 1000 ug via INTRAMUSCULAR

## 2019-11-22 NOTE — Progress Notes (Signed)
Referral successfully faxed to [facility] medical for rheumatology referral [phone]).

## 2019-11-22 NOTE — Progress Notes (Signed)
Referral for Rheumatology successfully faxed to [facility] who will accept pt insurance.

## 2019-11-22 NOTE — Assessment & Plan Note (Signed)
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  GI work-up: CT CAP was not authorized by her insurance.  We will do a peer to peer evaluation after we get the GI work-up results.

## 2019-11-27 ENCOUNTER — Telehealth: Payer: Self-pay | Admitting: Hematology and Oncology

## 2019-11-27 NOTE — Telephone Encounter (Signed)
Called pt per vmail left on 5/17 to reschedule appts. Unable to reach pt . Left message for patient to call back

## 2019-11-28 ENCOUNTER — Other Ambulatory Visit: Payer: Self-pay

## 2019-11-28 ENCOUNTER — Inpatient Hospital Stay: Payer: Medicaid Other

## 2019-11-28 VITALS — BP 110/62 | HR 85 | Temp 98.2°F | Resp 18

## 2019-11-28 DIAGNOSIS — E538 Deficiency of other specified B group vitamins: Secondary | ICD-10-CM

## 2019-11-28 MED ORDER — CYANOCOBALAMIN 1000 MCG/ML IJ SOLN
1000.0000 ug | Freq: Once | INTRAMUSCULAR | Status: AC
  Administered 2019-11-28: 1000 ug via INTRAMUSCULAR

## 2019-11-28 MED ORDER — CYANOCOBALAMIN 1000 MCG/ML IJ SOLN
INTRAMUSCULAR | Status: AC
  Filled 2019-11-28: qty 1

## 2019-11-28 NOTE — Patient Instructions (Signed)

## 2019-11-29 ENCOUNTER — Telehealth: Payer: Self-pay | Admitting: Hematology and Oncology

## 2019-11-29 NOTE — Telephone Encounter (Signed)
Scheduled per 05/12 los, patient has been called and notified. 

## 2019-11-30 ENCOUNTER — Inpatient Hospital Stay: Payer: Medicaid Other

## 2019-11-30 ENCOUNTER — Other Ambulatory Visit: Payer: Self-pay

## 2019-11-30 VITALS — BP 112/62 | HR 78 | Temp 98.2°F | Resp 18

## 2019-11-30 DIAGNOSIS — E538 Deficiency of other specified B group vitamins: Secondary | ICD-10-CM

## 2019-11-30 MED ORDER — CYANOCOBALAMIN 1000 MCG/ML IJ SOLN
1000.0000 ug | Freq: Once | INTRAMUSCULAR | Status: AC
  Administered 2019-11-30: 1000 ug via INTRAMUSCULAR

## 2019-12-01 ENCOUNTER — Encounter: Payer: Self-pay | Admitting: *Deleted

## 2019-12-01 NOTE — Progress Notes (Signed)
Received call from pt stating she has not heard from the Rheumatologist.  RN called [facility] to confirm the referral was received.  [facility] states they did not receive the initial referral.  Verified referral fax number [phone]).  Successfully sent second referral.

## 2019-12-04 ENCOUNTER — Ambulatory Visit: Payer: Medicaid Other | Admitting: Diagnostic Neuroimaging

## 2019-12-04 ENCOUNTER — Encounter: Payer: Self-pay | Admitting: Diagnostic Neuroimaging

## 2019-12-04 ENCOUNTER — Other Ambulatory Visit: Payer: Self-pay

## 2019-12-04 VITALS — BP 104/70 | HR 75 | Ht 62.0 in | Wt 165.4 lb

## 2019-12-04 DIAGNOSIS — M79605 Pain in left leg: Secondary | ICD-10-CM

## 2019-12-04 DIAGNOSIS — R29898 Other symptoms and signs involving the musculoskeletal system: Secondary | ICD-10-CM

## 2019-12-04 DIAGNOSIS — M79604 Pain in right leg: Secondary | ICD-10-CM

## 2019-12-04 NOTE — Patient Instructions (Signed)
  LOWER EXT PAIN / WEAKNESS (right worse than left) --> B12 deficiency (neuropathy) - continue B12 replacement - check EMG/NCS; then may consider additional lab testing or MRI lumbar spine - consider gabapentin or duloxetine for nerve pain  RIGHT HAND WEAKNESS - check EMG/NCS

## 2019-12-04 NOTE — Progress Notes (Signed)
[facility]  PATIENT: Meredith [name] DOB: 06-17-1981  REFERRING CLINICIAN: Jenny Reichmann, P* HISTORY FROM: patient  REASON FOR VISIT: new consult    HISTORICAL  CHIEF COMPLAINT:  Chief Complaint  Patient presents with  . Neuropathic pain    rm 7 New Pt "refered by my oncologist- pernicious anemia, low B12, Low Vit D; losing feeling in my hands, pain in right leg, has given out on me x 2; left leg losing feeling"    HISTORY OF PRESENT ILLNESS:   39 year old female here for evaluation of pain.  History of iron deficiency anemia, pernicious anemia vitamin B-12 deficiency, lymphocytic gastritis.  For past 2 years patient has had right greater than left leg pain, numbness, shooting pain sensation.  Symptoms radiate down the right anterior leg, knee and into her right foot.  No low back pain.  She has some right hand pain and weakness problems also.  Patient has been diagnosed with B12 deficiency now on replacement.  Started a couple months ago.   REVIEW OF SYSTEMS: Full 14 system review of systems performed and negative with exception of: As per HPI.  ALLERGIES: Allergies  Allergen Reactions  . Morphine And Related Nausea And Vomiting    Pt can take percocet without problems  . Promethazine Hcl Other (See Comments)    hypotention    HOME MEDICATIONS: Outpatient Medications Prior to Visit  Medication Sig Dispense Refill  . Cyanocobalamin (B-12 IJ) Inject as directed. 2 x weekly    . dicyclomine (BENTYL) 10 MG capsule Take 1 capsule (10 mg total) by mouth 4 (four) times daily -  before meals and at bedtime. 30 capsule 0  . ergocalciferol (VITAMIN D2) 1.25 MG (50000 UT) capsule Take 1 capsule (50,000 Units total) by mouth once a week. 30 capsule 6  . UNKNOWN TO PATIENT Med for acid reflux     No facility-administered medications prior to visit.    PAST MEDICAL HISTORY: Past Medical History:  Diagnosis Date  . Iron deficiency anemia    pernicious anemia  . Pyelonephritis 06/21/2011   10 kidney stones surgically removed  . 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 CESAREAN SECTION  jan 2009  . CESAREAN SECTION WITH BILATERAL TUBAL LIGATION N/A 04/07/2014   Procedure: CESAREAN SECTION WITH BILATERAL TUBAL LIGATION;  Surgeon: Annalee Genta, DO;  Location: [facility];  Service: Obstetrics;  Laterality: N/A;  . mirena iud N/A 2009  . NEPHROLITHOTOMY  08/03/2011   Procedure: NEPHROLITHOTOMY PERCUTANEOUS;  Surgeon: Ailene Rud, MD;  Location: [facility];  Service: Urology;  Laterality: Left;  . NEPHROSTOMY  08/03/11   left  . right wrist surgery  2005   I and D , infection to the bone  . WISDOM TOOTH EXTRACTION  2006    FAMILY HISTORY: Family History  Problem Relation Age of Onset  . Urolithiasis Mother   . Hypertension Maternal Grandmother   . Thyroid disease Maternal Grandmother   . Dementia Maternal Grandmother   . Heart disease Maternal Grandfather   . Diabetes Maternal Grandfather     SOCIAL HISTORY: Social History   Socioeconomic History  . Marital status: Married    Spouse name: Karie Fetch  . Number of children: 2  . Years of education: Not on file  . Highest education level: Associate degree: academic program  Occupational History    Comment: dental and  medical assistant  Tobacco Use  . Smoking status: Never Smoker  . Smokeless tobacco: Never Used  Substance and Sexual Activity  . Alcohol use: Yes    Comment: occas  . Drug use: No  . Sexual activity: Yes    Partners: Male    Birth control/protection: I.U.D.    Comment: BTL  Other Topics Concern  . Not on file  Social History Narrative   Lives with family   Social Determinants of Health   Financial Resource Strain:   . Difficulty of Paying Living Expenses:   Food Insecurity:   . Worried About Programme researcher, broadcasting/film/video in the Last Year:   . Barista in  the Last Year:   Transportation Needs:   . Freight forwarder (Medical):   Marland Kitchen Lack of Transportation (Non-Medical):   Physical Activity:   . Days of Exercise per Week:   . Minutes of Exercise per Session:   Stress:   . Feeling of Stress :   Social Connections:   . Frequency of Communication with Friends and Family:   . Frequency of Social Gatherings with Friends and Family:   . Attends Religious Services:   . Active Member of Clubs or Organizations:   . Attends Banker Meetings:   Marland Kitchen Marital Status:   Intimate Partner Violence:   . Fear of Current or Ex-Partner:   . Emotionally Abused:   Marland Kitchen Physically Abused:   . Sexually Abused:      PHYSICAL EXAM  GENERAL EXAM/CONSTITUTIONAL: Vitals:  Vitals:   12/04/19 0924  BP: 104/70  Pulse: 75  Weight: 165 lb 6.4 oz (75 kg)  Height: 5\' 2"  (1.575 m)     Body mass index is 30.25 kg/m. Wt Readings from Last 3 Encounters:  12/04/19 165 lb 6.4 oz (75 kg)  11/22/19 162 lb 3.2 oz (73.6 kg)  10/30/19 160 lb 9.6 oz (72.8 kg)     Patient is in no distress; well developed, nourished and groomed; neck is supple  CARDIOVASCULAR:  Examination of carotid arteries is normal; no carotid bruits  Regular rate and rhythm, no murmurs  Examination of peripheral vascular system by observation and palpation is normal  EYES:  Ophthalmoscopic exam of optic discs and posterior segments is normal; no papilledema or hemorrhages  No exam data present  MUSCULOSKELETAL:  Gait, strength, tone, movements noted in Neurologic exam below  NEUROLOGIC: MENTAL STATUS:  No flowsheet data found.  awake, alert, oriented to person, place and time  recent and remote memory intact  normal attention and concentration  language fluent, comprehension intact, naming intact  fund of knowledge appropriate  CRANIAL NERVE:   2nd - no papilledema on fundoscopic exam  2nd, 3rd, 4th, 6th - pupils equal and reactive to light, visual  fields full to confrontation, extraocular muscles intact, no nystagmus  5th - facial sensation symmetric  7th - facial strength symmetric  8th - hearing intact  9th - palate elevates symmetrically, uvula midline  11th - shoulder shrug symmetric  12th - tongue protrusion midline  MOTOR:   normal bulk and tone, full strength in the BUE, BLE; SLIGHTLY LIMITED IN RIGHT LEG DUE TO PAIN  SENSORY:   normal and symmetric to light touch, temperature, vibration  COORDINATION:   finger-nose-finger, fine finger movements normal  REFLEXES:   deep tendon reflexes 2+ and symmetric  GAIT/STATION:   narrow based gait     DIAGNOSTIC DATA (LABS, IMAGING, TESTING) - I reviewed patient records, labs,  notes, testing and imaging myself where available.  Lab Results  Component Value Date   WBC 8.4 10/27/2019   HGB 14.7 10/27/2019   HCT 44.8 10/27/2019   MCV 89.1 10/27/2019   PLT 272 10/27/2019      Component Value Date/Time   NA 140 10/27/2019 1112   K 4.1 10/27/2019 1112   CL 105 10/27/2019 1112   CO2 27 10/27/2019 1112   GLUCOSE 97 10/27/2019 1112   BUN 11 10/27/2019 1112   CREATININE 0.79 10/27/2019 1112   CREATININE 0.79 10/27/2012 1343   CALCIUM 9.1 10/27/2019 1112   PROT 7.3 10/27/2019 1112   ALBUMIN 4.0 10/27/2019 1112   AST 13 (L) 10/27/2019 1112   ALT 11 10/27/2019 1112   ALKPHOS 69 10/27/2019 1112   BILITOT 0.5 10/27/2019 1112   GFRNONAA >60 10/27/2019 1112   GFRAA >60 10/27/2019 1112   Lab Results  Component Value Date   CHOL 206 (H) 10/27/2012   HDL 55 10/27/2012   LDLCALC 125 (H) 10/27/2012   TRIG 130 10/27/2012   CHOLHDL 3.7 10/27/2012   Lab Results  Component Value Date   HGBA1C 5.4 10/27/2012   No results found for: [id number] Lab Results  Component Value Date   TSH 3.001 02/03/2019       ASSESSMENT AND PLAN  39 y.o. year old female here with:  Dx:  1. Pain in both lower extremities       PLAN:  LOWER EXT PAIN / WEAKNESS  (right worse than left) --> possibly related to B12 deficiency (neuropathy); lumbar radiculopathy also possibility - continue B12 replacement - check EMG/NCS; then may consider additional lab testing or MRI lumbar spine - consider gabapentin or duloxetine for nerve pain  RIGHT HAND WEAKNESS - check EMG/NCS  Orders Placed This Encounter  Procedures  . NCV with EMG(electromyography)   Return for for NCV/EMG.    Suanne Marker, MD 12/04/2019, 10:07 AM Certified in Neurology, Neurophysiology and Neuroimaging  [facility] [address] [place], [place] [address] [phone]

## 2019-12-05 ENCOUNTER — Other Ambulatory Visit: Payer: Self-pay

## 2019-12-05 ENCOUNTER — Inpatient Hospital Stay: Payer: Medicaid Other

## 2019-12-05 VITALS — BP 108/72 | HR 72 | Temp 98.2°F | Resp 18

## 2019-12-05 DIAGNOSIS — E538 Deficiency of other specified B group vitamins: Secondary | ICD-10-CM | POA: Diagnosis not present

## 2019-12-05 MED ORDER — CYANOCOBALAMIN 1000 MCG/ML IJ SOLN
1000.0000 ug | Freq: Once | INTRAMUSCULAR | Status: AC
  Administered 2019-12-05: 1000 ug via INTRAMUSCULAR

## 2019-12-05 MED ORDER — CYANOCOBALAMIN 1000 MCG/ML IJ SOLN
INTRAMUSCULAR | Status: AC
  Filled 2019-12-05: qty 1

## 2019-12-05 NOTE — Patient Instructions (Signed)
Cyanocobalamin, Pyridoxine, and Folate What is this medicine? A multivitamin containing folic acid, vitamin B6, and vitamin B12. This medicine may be used for other purposes; ask your health care provider or pharmacist if you have questions. COMMON BRAND NAME(S): AllanFol RX, AllanTex, Av-Vite FB, B Complex with Folic Acid, ComBgen, FaBB, Folamin, Folastin, Folbalin, Folbee, Folbic, Folcaps, Folgard, Folgard RX, Folgard RX 2.2, Folplex, Folplex 2.2, Foltabs 800, Foltx, Homocysteine Formula, Niva-Fol, NuFol, TL Gard RX, Virt-Gard, Virt-Vite, Virt-Vite Forte, Vita-Respa What should I tell my health care provider before I take this medicine? They need to know if you have any of these conditions:  bleeding or clotting disorder  history of anemia of any type  other chronic health condition  an unusual or allergic reaction to vitamins, other medicines, foods, dyes, or preservatives  pregnant or trying to get pregnant  breast-feeding How should I use this medicine? Take by mouth with a glass of water. May take with food. Follow the directions on the prescription label. It is usually given once a day. Do not take your medicine more often than directed. Contact your pediatrician regarding the use of this medicine in children. Special care may be needed. Overdosage: If you think you have taken too much of this medicine contact a poison control center or emergency room at once. NOTE: This medicine is only for you. Do not share this medicine with others. What if I miss a dose? If you miss a dose, take it as soon as you can. If it is almost time for your next dose, take only that dose. Do not take double or extra doses. What may interact with this medicine?  levodopa This list may not describe all possible interactions. Give your health care provider a list of all the medicines, herbs, non-prescription drugs, or dietary supplements you use. Also tell them if you smoke, drink alcohol, or use illegal  drugs. Some items may interact with your medicine. What should I watch for while using this medicine? See your health care professional for regular checks on your progress. Remember that vitamin supplements do not replace the need for good nutrition from a balanced diet. What side effects may I notice from receiving this medicine? Side effects that you should report to your doctor or health care professional as soon as possible:  allergic reaction such as skin rash or difficulty breathing  vomiting Side effects that usually do not require medical attention (report to your doctor or health care professional if they continue or are bothersome):  nausea  stomach upset This list may not describe all possible side effects. Call your doctor for medical advice about side effects. You may report side effects to FDA at 1-800-FDA-1088. Where should I keep my medicine? Keep out of the reach of children. Most vitamins should be stored at controlled room temperature. Check your specific product directions. Protect from heat and moisture. Throw away any unused medicine after the expiration date. NOTE: This sheet is a summary. It may not cover all possible information. If you have questions about this medicine, talk to your doctor, pharmacist, or health care provider.  2020 Elsevier/Gold Standard (2007-08-20 00:59:55)  

## 2019-12-07 ENCOUNTER — Ambulatory Visit (HOSPITAL_COMMUNITY)
Admission: RE | Admit: 2019-12-07 | Discharge: 2019-12-07 | Disposition: A | Discharge status: Home / Self Care | Payer: Medicaid Other | Source: Ambulatory Visit | Attending: Hematology and Oncology | Admitting: Hematology and Oncology

## 2019-12-07 ENCOUNTER — Inpatient Hospital Stay: Payer: Medicaid Other

## 2019-12-07 ENCOUNTER — Other Ambulatory Visit: Payer: Self-pay

## 2019-12-07 DIAGNOSIS — R634 Abnormal weight loss: Secondary | ICD-10-CM | POA: Insufficient documentation

## 2019-12-07 MED ORDER — IOHEXOL 300 MG/ML  SOLN
100.0000 mL | Freq: Once | INTRAMUSCULAR | Status: AC | PRN
  Administered 2019-12-07: 100 mL via INTRAVENOUS

## 2019-12-07 MED ORDER — SODIUM CHLORIDE (PF) 0.9 % IJ SOLN
INTRAMUSCULAR | Status: AC
  Filled 2019-12-07: qty 50

## 2019-12-11 NOTE — Progress Notes (Signed)
Patient Care Team: Etter Sjogren as PCP - General (Physician Assistant)  DIAGNOSIS:    ICD-10-CM   1. Pernicious anemia  D51.0     CHIEF COMPLIANT: Follow-up ofperniciousanemiaand B12 deficiency  INTERVAL HISTORY: Meredith [name] is a 39 y.o. with above-mentioned history of iron deficiency anemia, perniciousanemiaand B12 deficiency. CT CAP on 12/07/19 showed no acute findings. She presents to the clinic todayfor follow-up. She continues to have pain in her legs.  She is seeing neurology who is planning to perform nerve conduction studies.  We referred her to rheumatology but she could not get an appointment with them.  She has small lung nodules which are essentially benign.  She is a non-smoker.  ALLERGIES:  is allergic to morphine and related and promethazine hcl.  MEDICATIONS:  Current Outpatient Medications  Medication Sig Dispense Refill   Cyanocobalamin (B-12 IJ) Inject as directed. 2 x weekly     dicyclomine (BENTYL) 10 MG capsule Take 1 capsule (10 mg total) by mouth 4 (four) times daily -  before meals and at bedtime. 30 capsule 0   ergocalciferol (VITAMIN D2) 1.25 MG (50000 UT) capsule Take 1 capsule (50,000 Units total) by mouth once a week. 30 capsule 6   UNKNOWN TO PATIENT Med for acid reflux     No current facility-administered medications for this visit.   Facility-Administered Medications Ordered in Other Visits  Medication Dose Route Frequency Provider Last Rate Last Admin   cyanocobalamin ((VITAMIN B-12)) injection 1,000 mcg  1,000 mcg Intramuscular Once Nicholas Lose, MD        PHYSICAL EXAMINATION: ECOG PERFORMANCE STATUS: 1 - Symptomatic but completely ambulatory  Vitals:   12/12/19 1255  BP: 109/78  Pulse: 90  Resp: 18  Temp: 99.1 F (37.3 C)  SpO2: 99%   Filed Weights   12/12/19 1255  Weight: 167 lb 1.6 oz (75.8 kg)    LABORATORY DATA:  I have reviewed the data as listed CMP Latest Ref Rng & Units 10/27/2019  07/15/2014 10/27/2012  Glucose 70 - 99 mg/dL 97 108(H) 89  BUN 6 - 20 mg/dL 11 17 15   Creatinine 0.44 - 1.00 mg/dL 0.79 0.75 0.79  Sodium 135 - 145 mmol/L 140 135 141  Potassium 3.5 - 5.1 mmol/L 4.1 3.6 4.7  Chloride 98 - 111 mmol/L 105 105 105  CO2 22 - 32 mmol/L 27 25 26   Calcium 8.9 - 10.3 mg/dL 9.1 9.1 9.7  Total Protein 6.5 - 8.1 g/dL 7.3 8.1 7.2  Total Bilirubin 0.3 - 1.2 mg/dL 0.5 0.8 0.5  Alkaline Phos 38 - 126 U/L 69 76 73  AST 15 - 41 U/L 13(L) 21 20  ALT 0 - 44 U/L 11 24 31     Lab Results  Component Value Date   WBC 8.4 10/27/2019   HGB 14.7 10/27/2019   HCT 44.8 10/27/2019   MCV 89.1 10/27/2019   PLT 272 10/27/2019   NEUTROABS 5.6 10/27/2019    ASSESSMENT & PLAN:  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 Currently on B12 injections twice a week.  Vitamin D deficiency: I provided a prescription for 50,000 units of vitamin D weekly. GI work-up: Lymphocytic gastritis. CT CAP 12/07/2019: No acute findings in chest abdomen pelvis.  Patient has been referred to neurology: Neurologist  performing nerve conduction studies.  Severe pain in the leg: On ibuprofen as needed We will refer her to [facility] for rheumatology consultation regarding autoimmune disease/lupus   No orders of the defined types were placed in this encounter.  The patient has a good understanding of the overall plan. she agrees with it. she will call with any problems that may develop before the next visit here.  Total time spent: 30 mins including face to face time and time spent for planning, charting and coordination of care  Serena Croissant, MD 12/12/2019  I, Kirt Boys Dorshimer, am acting as scribe for Dr. Serena Croissant.  I have  reviewed the above documentation for accuracy and completeness, and I agree with the above.

## 2019-12-12 ENCOUNTER — Inpatient Hospital Stay: Payer: Medicaid Other | Attending: Hematology and Oncology

## 2019-12-12 ENCOUNTER — Inpatient Hospital Stay (HOSPITAL_BASED_OUTPATIENT_CLINIC_OR_DEPARTMENT_OTHER): Payer: Medicaid Other | Admitting: Hematology and Oncology

## 2019-12-12 ENCOUNTER — Other Ambulatory Visit: Payer: Self-pay

## 2019-12-12 DIAGNOSIS — D510 Vitamin B12 deficiency anemia due to intrinsic factor deficiency: Secondary | ICD-10-CM | POA: Insufficient documentation

## 2019-12-12 DIAGNOSIS — E538 Deficiency of other specified B group vitamins: Secondary | ICD-10-CM

## 2019-12-12 MED ORDER — CYANOCOBALAMIN 1000 MCG/ML IJ SOLN
INTRAMUSCULAR | Status: AC
  Filled 2019-12-12: qty 1

## 2019-12-12 MED ORDER — CYANOCOBALAMIN 1000 MCG/ML IJ SOLN
1000.0000 ug | Freq: Once | INTRAMUSCULAR | Status: AC
  Administered 2019-12-12: 1000 ug via INTRAMUSCULAR

## 2019-12-12 NOTE — Assessment & Plan Note (Signed)
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 I recommended increasing the B12 injections to twice a week.  Vitamin D deficiency: I provided a prescription for 50,000 units of vitamin D weekly. GI work-up: Lymphocytic gastritis. CT CAP 12/07/2019: No acute findings in chest abdomen pelvis. Patient has been referred to neurology and rheumatology.  Severe pain in the leg: On ibuprofen as needed

## 2019-12-12 NOTE — Patient Instructions (Signed)
Cyanocobalamin, Pyridoxine, and Folate What is this medicine? A multivitamin containing folic acid, vitamin B6, and vitamin B12. This medicine may be used for other purposes; ask your health care provider or pharmacist if you have questions. COMMON BRAND NAME(S): AllanFol RX, AllanTex, Av-Vite FB, B Complex with Folic Acid, ComBgen, FaBB, Folamin, Folastin, Folbalin, Folbee, Folbic, Folcaps, Folgard, Folgard RX, Folgard RX 2.2, Folplex, Folplex 2.2, Foltabs 800, Foltx, Homocysteine Formula, Niva-Fol, NuFol, TL Gard RX, Virt-Gard, Virt-Vite, Virt-Vite Forte, Vita-Respa What should I tell my health care provider before I take this medicine? They need to know if you have any of these conditions:  bleeding or clotting disorder  history of anemia of any type  other chronic health condition  an unusual or allergic reaction to vitamins, other medicines, foods, dyes, or preservatives  pregnant or trying to get pregnant  breast-feeding How should I use this medicine? Take by mouth with a glass of water. May take with food. Follow the directions on the prescription label. It is usually given once a day. Do not take your medicine more often than directed. Contact your pediatrician regarding the use of this medicine in children. Special care may be needed. Overdosage: If you think you have taken too much of this medicine contact a poison control center or emergency room at once. NOTE: This medicine is only for you. Do not share this medicine with others. What if I miss a dose? If you miss a dose, take it as soon as you can. If it is almost time for your next dose, take only that dose. Do not take double or extra doses. What may interact with this medicine?  levodopa This list may not describe all possible interactions. Give your health care provider a list of all the medicines, herbs, non-prescription drugs, or dietary supplements you use. Also tell them if you smoke, drink alcohol, or use illegal  drugs. Some items may interact with your medicine. What should I watch for while using this medicine? See your health care professional for regular checks on your progress. Remember that vitamin supplements do not replace the need for good nutrition from a balanced diet. What side effects may I notice from receiving this medicine? Side effects that you should report to your doctor or health care professional as soon as possible:  allergic reaction such as skin rash or difficulty breathing  vomiting Side effects that usually do not require medical attention (report to your doctor or health care professional if they continue or are bothersome):  nausea  stomach upset This list may not describe all possible side effects. Call your doctor for medical advice about side effects. You may report side effects to FDA at 1-800-FDA-1088. Where should I keep my medicine? Keep out of the reach of children. Most vitamins should be stored at controlled room temperature. Check your specific product directions. Protect from heat and moisture. Throw away any unused medicine after the expiration date. NOTE: This sheet is a summary. It may not cover all possible information. If you have questions about this medicine, talk to your doctor, pharmacist, or health care provider.  2020 Elsevier/Gold Standard (2007-08-20 00:59:55)  

## 2019-12-14 ENCOUNTER — Other Ambulatory Visit: Payer: Self-pay

## 2019-12-14 ENCOUNTER — Inpatient Hospital Stay: Payer: Medicaid Other

## 2019-12-14 VITALS — BP 108/72 | HR 78 | Temp 98.2°F | Resp 18

## 2019-12-14 DIAGNOSIS — E538 Deficiency of other specified B group vitamins: Secondary | ICD-10-CM

## 2019-12-14 DIAGNOSIS — D510 Vitamin B12 deficiency anemia due to intrinsic factor deficiency: Secondary | ICD-10-CM | POA: Diagnosis not present

## 2019-12-14 MED ORDER — CYANOCOBALAMIN 1000 MCG/ML IJ SOLN
1000.0000 ug | Freq: Once | INTRAMUSCULAR | Status: AC
  Administered 2019-12-14: 1000 ug via INTRAMUSCULAR

## 2019-12-19 ENCOUNTER — Inpatient Hospital Stay: Payer: Medicaid Other

## 2019-12-19 ENCOUNTER — Other Ambulatory Visit: Payer: Self-pay

## 2019-12-19 VITALS — BP 118/72 | HR 68 | Temp 98.2°F | Resp 18

## 2019-12-19 DIAGNOSIS — D510 Vitamin B12 deficiency anemia due to intrinsic factor deficiency: Secondary | ICD-10-CM | POA: Diagnosis not present

## 2019-12-19 DIAGNOSIS — E538 Deficiency of other specified B group vitamins: Secondary | ICD-10-CM

## 2019-12-19 MED ORDER — CYANOCOBALAMIN 1000 MCG/ML IJ SOLN
1000.0000 ug | Freq: Once | INTRAMUSCULAR | Status: AC
  Administered 2019-12-19: 1000 ug via INTRAMUSCULAR

## 2019-12-19 NOTE — Patient Instructions (Signed)
Cyanocobalamin, Pyridoxine, and Folate What is this medicine? A multivitamin containing folic acid, vitamin B6, and vitamin B12. This medicine may be used for other purposes; ask your health care provider or pharmacist if you have questions. COMMON BRAND NAME(S): AllanFol RX, AllanTex, Av-Vite FB, B Complex with Folic Acid, ComBgen, FaBB, Folamin, Folastin, Folbalin, Folbee, Folbic, Folcaps, Folgard, Folgard RX, Folgard RX 2.2, Folplex, Folplex 2.2, Foltabs 800, Foltx, Homocysteine Formula, Niva-Fol, NuFol, TL Gard RX, Virt-Gard, Virt-Vite, Virt-Vite Forte, Vita-Respa What should I tell my health care provider before I take this medicine? They need to know if you have any of these conditions:  bleeding or clotting disorder  history of anemia of any type  other chronic health condition  an unusual or allergic reaction to vitamins, other medicines, foods, dyes, or preservatives  pregnant or trying to get pregnant  breast-feeding How should I use this medicine? Take by mouth with a glass of water. May take with food. Follow the directions on the prescription label. It is usually given once a day. Do not take your medicine more often than directed. Contact your pediatrician regarding the use of this medicine in children. Special care may be needed. Overdosage: If you think you have taken too much of this medicine contact a poison control center or emergency room at once. NOTE: This medicine is only for you. Do not share this medicine with others. What if I miss a dose? If you miss a dose, take it as soon as you can. If it is almost time for your next dose, take only that dose. Do not take double or extra doses. What may interact with this medicine?  levodopa This list may not describe all possible interactions. Give your health care provider a list of all the medicines, herbs, non-prescription drugs, or dietary supplements you use. Also tell them if you smoke, drink alcohol, or use illegal  drugs. Some items may interact with your medicine. What should I watch for while using this medicine? See your health care professional for regular checks on your progress. Remember that vitamin supplements do not replace the need for good nutrition from a balanced diet. What side effects may I notice from receiving this medicine? Side effects that you should report to your doctor or health care professional as soon as possible:  allergic reaction such as skin rash or difficulty breathing  vomiting Side effects that usually do not require medical attention (report to your doctor or health care professional if they continue or are bothersome):  nausea  stomach upset This list may not describe all possible side effects. Call your doctor for medical advice about side effects. You may report side effects to FDA at 1-800-FDA-1088. Where should I keep my medicine? Keep out of the reach of children. Most vitamins should be stored at controlled room temperature. Check your specific product directions. Protect from heat and moisture. Throw away any unused medicine after the expiration date. NOTE: This sheet is a summary. It may not cover all possible information. If you have questions about this medicine, talk to your doctor, pharmacist, or health care provider.  2020 Elsevier/Gold Standard (2007-08-20 00:59:55)  

## 2019-12-20 NOTE — Progress Notes (Signed)
Patient Care Team: Etter Sjogren as PCP - General (Physician Assistant)  DIAGNOSIS:    ICD-10-CM   1. Pernicious anemia  D51.0     CHIEF COMPLIANT: Follow-up ofperniciousanemiaand B12 deficiency  INTERVAL HISTORY: Meredith [name] is a 39 y.o. with above-mentioned history of iron deficiency anemia,perniciousanemiaand B12 deficiency. She presents to the clinic todayfor follow-up. She thinks that the B12 injections are helping her slightly.  Energy levels are slightly better.  But she continues to have muscle aches and pains.  ALLERGIES:  is allergic to morphine and related and promethazine hcl.  MEDICATIONS:  Current Outpatient Medications  Medication Sig Dispense Refill  . Cyanocobalamin (B-12 IJ) Inject as directed. 2 x weekly    . dicyclomine (BENTYL) 10 MG capsule Take 1 capsule (10 mg total) by mouth 4 (four) times daily -  before meals and at bedtime. 30 capsule 0  . ergocalciferol (VITAMIN D2) 1.25 MG (50000 UT) capsule Take 1 capsule (50,000 Units total) by mouth once a week. 30 capsule 6  . UNKNOWN TO PATIENT Med for acid reflux     No current facility-administered medications for this visit.    PHYSICAL EXAMINATION: ECOG PERFORMANCE STATUS: 1 - Symptomatic but completely ambulatory  There were no vitals filed for this visit. There were no vitals filed for this visit.  LABORATORY DATA:  I have reviewed the data as listed CMP Latest Ref Rng & Units 10/27/2019 07/15/2014 10/27/2012  Glucose 70 - 99 mg/dL 97 108(H) 89  BUN 6 - 20 mg/dL 11 17 15   Creatinine 0.44 - 1.00 mg/dL 0.79 0.75 0.79  Sodium 135 - 145 mmol/L 140 135 141  Potassium 3.5 - 5.1 mmol/L 4.1 3.6 4.7  Chloride 98 - 111 mmol/L 105 105 105  CO2 22 - 32 mmol/L 27 25 26   Calcium 8.9 - 10.3 mg/dL 9.1 9.1 9.7  Total Protein 6.5 - 8.1 g/dL 7.3 8.1 7.2  Total Bilirubin 0.3 - 1.2 mg/dL 0.5 0.8 0.5  Alkaline Phos 38 - 126 U/L 69 76 73  AST 15 - 41 U/L 13(L) 21 20  ALT 0 - 44 U/L 11 24  31     Lab Results  Component Value Date   WBC 8.0 12/21/2019   HGB 12.9 12/21/2019   HCT 38.8 12/21/2019   MCV 87.8 12/21/2019   PLT 290 12/21/2019   NEUTROABS 5.2 12/21/2019    ASSESSMENT & PLAN:  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 Currently on B12 injections once a week x4 and after that monthly  Vitamin D deficiency: I provided a prescription for 50,000 units of vitamin D weekly. 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 CT CAP 12/07/2019: No acute findings in chest abdomen pelvis.  Patient has been referred to neurology: Neurologist performing nerve conduction studies.  Severe pain in the leg: On ibuprofen as needed We will refer her to [facility] for rheumatology consultation regarding autoimmune disease/lupus   Return to clinic in 3 months for follow-up  No orders of the defined types were placed in this encounter.  The patient has a good understanding of the overall plan. she agrees with it. she will call with any problems that may develop before the next visit here.  Total time spent: 20 mins including face to face time and time spent for planning,  charting and coordination of care  Serena Croissant, MD 12/21/2019  Jenness Corner Dorshimer, am acting as scribe for Dr. Serena Croissant.  I have reviewed the above documentation for accuracy and completeness, and I agree with the above.

## 2019-12-21 ENCOUNTER — Other Ambulatory Visit: Payer: Self-pay | Admitting: *Deleted

## 2019-12-21 ENCOUNTER — Inpatient Hospital Stay (HOSPITAL_BASED_OUTPATIENT_CLINIC_OR_DEPARTMENT_OTHER): Payer: Medicaid Other | Admitting: Hematology and Oncology

## 2019-12-21 ENCOUNTER — Other Ambulatory Visit: Payer: Self-pay

## 2019-12-21 ENCOUNTER — Inpatient Hospital Stay: Payer: Medicaid Other

## 2019-12-21 DIAGNOSIS — D510 Vitamin B12 deficiency anemia due to intrinsic factor deficiency: Secondary | ICD-10-CM | POA: Diagnosis not present

## 2019-12-21 DIAGNOSIS — E538 Deficiency of other specified B group vitamins: Secondary | ICD-10-CM

## 2019-12-21 DIAGNOSIS — D509 Iron deficiency anemia, unspecified: Secondary | ICD-10-CM

## 2019-12-21 LAB — CBC WITH DIFFERENTIAL (CANCER CENTER ONLY)
Abs Immature Granulocytes: 0.02 10*3/uL (ref 0.00–0.07)
Basophils Absolute: 0.1 10*3/uL (ref 0.0–0.1)
Basophils Relative: 1 %
Eosinophils Absolute: 0.2 10*3/uL (ref 0.0–0.5)
Eosinophils Relative: 2 %
HCT: 38.8 % (ref 36.0–46.0)
Hemoglobin: 12.9 g/dL (ref 12.0–15.0)
Immature Granulocytes: 0 %
Lymphocytes Relative: 25 %
Lymphs Abs: 2 10*3/uL (ref 0.7–4.0)
MCH: 29.2 pg (ref 26.0–34.0)
MCHC: 33.2 g/dL (ref 30.0–36.0)
MCV: 87.8 fL (ref 80.0–100.0)
Monocytes Absolute: 0.6 10*3/uL (ref 0.1–1.0)
Monocytes Relative: 7 %
Neutro Abs: 5.2 10*3/uL (ref 1.7–7.7)
Neutrophils Relative %: 65 %
Platelet Count: 290 10*3/uL (ref 150–400)
RBC: 4.42 MIL/uL (ref 3.87–5.11)
RDW: 12 % (ref 11.5–15.5)
WBC Count: 8 10*3/uL (ref 4.0–10.5)
nRBC: 0 % (ref 0.0–0.2)

## 2019-12-21 LAB — VITAMIN D 25 HYDROXY (VIT D DEFICIENCY, FRACTURES): Vit D, 25-Hydroxy: 25.66 ng/mL — ABNORMAL LOW (ref 30–100)

## 2019-12-21 LAB — IRON AND TIBC
Iron: 55 ug/dL (ref 41–142)
Saturation Ratios: 18 % — ABNORMAL LOW (ref 21–57)
TIBC: 299 ug/dL (ref 236–444)
UIBC: 244 ug/dL (ref 120–384)

## 2019-12-21 LAB — FERRITIN: Ferritin: 23 ng/mL (ref 11–307)

## 2019-12-21 MED ORDER — CYANOCOBALAMIN 1000 MCG/ML IJ SOLN
1000.0000 ug | Freq: Once | INTRAMUSCULAR | Status: AC
  Administered 2019-12-21: 1000 ug via INTRAMUSCULAR

## 2019-12-21 NOTE — Assessment & Plan Note (Signed)
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 Currently on B12 injections twice a week.  Vitamin D deficiency: I provided a prescription for 50,000 units of vitamin D weekly. 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 CT CAP 12/07/2019: No acute findings in chest abdomen pelvis.  Patient has been referred to neurology: Neurologist performing nerve conduction studies.  Severe pain in the leg: On ibuprofen as needed We will refer her to [facility] for rheumatology consultation regarding autoimmune disease/lupus

## 2019-12-21 NOTE — Patient Instructions (Signed)
Cyanocobalamin, Pyridoxine, and Folate What is this medicine? A multivitamin containing folic acid, vitamin B6, and vitamin B12. This medicine may be used for other purposes; ask your health care provider or pharmacist if you have questions. COMMON BRAND NAME(S): AllanFol RX, AllanTex, Av-Vite FB, B Complex with Folic Acid, ComBgen, FaBB, Folamin, Folastin, Folbalin, Folbee, Folbic, Folcaps, Folgard, Folgard RX, Folgard RX 2.2, Folplex, Folplex 2.2, Foltabs 800, Foltx, Homocysteine Formula, Niva-Fol, NuFol, TL Gard RX, Virt-Gard, Virt-Vite, Virt-Vite Forte, Vita-Respa What should I tell my health care provider before I take this medicine? They need to know if you have any of these conditions:  bleeding or clotting disorder  history of anemia of any type  other chronic health condition  an unusual or allergic reaction to vitamins, other medicines, foods, dyes, or preservatives  pregnant or trying to get pregnant  breast-feeding How should I use this medicine? Take by mouth with a glass of water. May take with food. Follow the directions on the prescription label. It is usually given once a day. Do not take your medicine more often than directed. Contact your pediatrician regarding the use of this medicine in children. Special care may be needed. Overdosage: If you think you have taken too much of this medicine contact a poison control center or emergency room at once. NOTE: This medicine is only for you. Do not share this medicine with others. What if I miss a dose? If you miss a dose, take it as soon as you can. If it is almost time for your next dose, take only that dose. Do not take double or extra doses. What may interact with this medicine?  levodopa This list may not describe all possible interactions. Give your health care provider a list of all the medicines, herbs, non-prescription drugs, or dietary supplements you use. Also tell them if you smoke, drink alcohol, or use illegal  drugs. Some items may interact with your medicine. What should I watch for while using this medicine? See your health care professional for regular checks on your progress. Remember that vitamin supplements do not replace the need for good nutrition from a balanced diet. What side effects may I notice from receiving this medicine? Side effects that you should report to your doctor or health care professional as soon as possible:  allergic reaction such as skin rash or difficulty breathing  vomiting Side effects that usually do not require medical attention (report to your doctor or health care professional if they continue or are bothersome):  nausea  stomach upset This list may not describe all possible side effects. Call your doctor for medical advice about side effects. You may report side effects to FDA at 1-800-FDA-1088. Where should I keep my medicine? Keep out of the reach of children. Most vitamins should be stored at controlled room temperature. Check your specific product directions. Protect from heat and moisture. Throw away any unused medicine after the expiration date. NOTE: This sheet is a summary. It may not cover all possible information. If you have questions about this medicine, talk to your doctor, pharmacist, or health care provider.  2020 Elsevier/Gold Standard (2007-08-20 00:59:55)  

## 2019-12-28 ENCOUNTER — Telehealth: Payer: Self-pay | Admitting: Diagnostic Neuroimaging

## 2019-12-28 ENCOUNTER — Ambulatory Visit (INDEPENDENT_AMBULATORY_CARE_PROVIDER_SITE_OTHER): Payer: Medicaid Other | Admitting: Diagnostic Neuroimaging

## 2019-12-28 ENCOUNTER — Encounter: Payer: Medicaid Other | Admitting: Diagnostic Neuroimaging

## 2019-12-28 ENCOUNTER — Other Ambulatory Visit: Payer: Self-pay

## 2019-12-28 DIAGNOSIS — M79604 Pain in right leg: Secondary | ICD-10-CM

## 2019-12-28 DIAGNOSIS — M79605 Pain in left leg: Secondary | ICD-10-CM

## 2019-12-28 DIAGNOSIS — Z0289 Encounter for other administrative examinations: Secondary | ICD-10-CM

## 2019-12-28 NOTE — Telephone Encounter (Signed)
medicaid order sent to GI. They will obtain the auth and reach out to the patient to schedule.  °

## 2019-12-28 NOTE — Progress Notes (Signed)
Orders Placed This Encounter  Procedures  . MR LUMBAR SPINE WO CONTRAST    Suanne Marker, MD 12/28/2019, 3:40 PM Certified in Neurology, Neurophysiology and Neuroimaging  [facility] [address] [place], [place] [address] [phone]

## 2019-12-29 ENCOUNTER — Inpatient Hospital Stay: Payer: Medicaid Other

## 2019-12-29 ENCOUNTER — Other Ambulatory Visit: Payer: Self-pay

## 2019-12-29 VITALS — BP 105/67 | HR 89 | Temp 98.5°F | Resp 18

## 2019-12-29 DIAGNOSIS — D510 Vitamin B12 deficiency anemia due to intrinsic factor deficiency: Secondary | ICD-10-CM | POA: Diagnosis not present

## 2019-12-29 DIAGNOSIS — E538 Deficiency of other specified B group vitamins: Secondary | ICD-10-CM

## 2019-12-29 MED ORDER — CYANOCOBALAMIN 1000 MCG/ML IJ SOLN
1000.0000 ug | Freq: Once | INTRAMUSCULAR | Status: AC
  Administered 2019-12-29: 1000 ug via INTRAMUSCULAR

## 2020-01-04 ENCOUNTER — Other Ambulatory Visit: Payer: Self-pay

## 2020-01-04 ENCOUNTER — Inpatient Hospital Stay (HOSPITAL_BASED_OUTPATIENT_CLINIC_OR_DEPARTMENT_OTHER): Payer: Medicaid Other | Admitting: Hematology and Oncology

## 2020-01-04 ENCOUNTER — Inpatient Hospital Stay: Payer: Medicaid Other

## 2020-01-04 ENCOUNTER — Other Ambulatory Visit: Payer: Self-pay | Admitting: *Deleted

## 2020-01-04 VITALS — BP 101/59 | HR 89 | Temp 98.2°F | Resp 16 | Wt 169.4 lb

## 2020-01-04 VITALS — BP 109/73 | HR 84

## 2020-01-04 DIAGNOSIS — D509 Iron deficiency anemia, unspecified: Secondary | ICD-10-CM | POA: Diagnosis not present

## 2020-01-04 DIAGNOSIS — E538 Deficiency of other specified B group vitamins: Secondary | ICD-10-CM

## 2020-01-04 DIAGNOSIS — D8989 Other specified disorders involving the immune mechanism, not elsewhere classified: Secondary | ICD-10-CM

## 2020-01-04 DIAGNOSIS — D510 Vitamin B12 deficiency anemia due to intrinsic factor deficiency: Secondary | ICD-10-CM

## 2020-01-04 MED ORDER — CYANOCOBALAMIN 1000 MCG/ML IJ SOLN
1000.0000 ug | Freq: Once | INTRAMUSCULAR | Status: AC
  Administered 2020-01-04: 1000 ug via INTRAMUSCULAR

## 2020-01-04 NOTE — Assessment & Plan Note (Signed)
Current treatment: Monthly B12 injections after next week. Previous history of iron deficiency anemia: We will check iron studies. Return to clinic to repeat labs and follow-up with me.  Patient saw rheumatology and does not feel like they have helped her. Patient is currently seeing neurology for her right-sided pain and weakness.  Recheck blood counts and follow-up.

## 2020-01-04 NOTE — Progress Notes (Signed)
Per MD request RN successfully faxed rheumatology referral to [facility] [phone])

## 2020-01-04 NOTE — Patient Instructions (Signed)

## 2020-01-04 NOTE — Procedures (Signed)
[facility]  NCS (NERVE CONDUCTION STUDY) WITH EMG (ELECTROMYOGRAPHY) REPORT   STUDY DATE: 12/28/19 PATIENT NAME: Meredith [name] DOB: 1981-01-14 MRN: [id number]  ORDERING CLINICIAN: Joycelyn Schmid, MD   TECHNOLOGIST: Durenda Age ELECTROMYOGRAPHER: Glenford Bayley. Shawnn Bouillon, MD  CLINICAL INFORMATION: 39 year old female with bilateral leg pain.  FINDINGS: NERVE CONDUCTION STUDY:  Bilateral peroneal and tibial motor responses are normal.  Bilateral sural and superficial peroneal sensory responses are normal.  Bilateral tibial F wave latencies are normal.   NEEDLE ELECTROMYOGRAPHY:  Needle examination of right lower extremity vastus medialis, tibialis anterior, gastrocnemius is normal.   IMPRESSION:   There is a normal study.  No electrodiagnostic evidence of large fiber neuropathy at this time.    INTERPRETING PHYSICIAN:  Suanne Marker, MD Certified in Neurology, Neurophysiology and Neuroimaging  [facility] [address], [address] [place], [place] [address] [phone]   [facility]    Nerve / Sites Muscle Latency Ref. Amplitude Ref. Rel Amp Segments Distance Velocity Ref. Area    ms ms mV mV %  cm m/s m/s mVms  R Peroneal - EDB     Ankle EDB 4.1 ?6.5 6.0 ?2.0 100 Ankle - EDB 9   18.4     Fib head EDB 9.2  5.8  96 Fib head - Ankle 26 51 ?44 17.3     Pop fossa EDB 11.1  5.8  99.8 Pop fossa - Fib head 10 52 ?44 17.7         Pop fossa - Ankle      L Peroneal - EDB     Ankle EDB 3.8 ?6.5 7.3 ?2.0 100 Ankle - EDB 9   21.3     Fib head EDB 8.7  7.0  95.6 Fib head - Ankle 25 51 ?44 21.0     Pop fossa EDB 10.7  6.7  95.5 Pop fossa - Fib head 10 50 ?44 20.5         Pop fossa - Ankle      R Tibial - AH     Ankle AH 3.1 ?5.8 17.7 ?4.0 100 Ankle - AH 9   34.4     Pop fossa AH 10.3  13.0  73.5 Pop fossa - Ankle 34 47 ?41 31.2  L Tibial - AH     Ankle AH 3.3 ?5.8 13.6 ?4.0 100 Ankle - AH 9   30.5     Pop fossa AH 10.9  11.2  82.3  Pop fossa - Ankle 35 46 ?41 27.8             SNC    Nerve / Sites Rec. Site Peak Lat Ref.  Amp Ref. Segments Distance    ms ms V V  cm  R Sural - Ankle (Calf)     Calf Ankle 3.1 ?4.4 16 ?6 Calf - Ankle 14  L Sural - Ankle (Calf)     Calf Ankle 3.4 ?4.4 17 ?6 Calf - Ankle 14  R Superficial peroneal - Ankle     Lat leg Ankle 3.6 ?4.4 8 ?6 Lat leg - Ankle 14  L Superficial peroneal - Ankle     Lat leg Ankle 3.5 ?4.4 9 ?6 Lat leg - Ankle 14             F  Wave    Nerve F Lat Ref.   ms ms  R Tibial - AH 40.6 ?56.0  L Tibial - AH 42.5 ?56.0  EMG Summary Table    Spontaneous MUAP Recruitment  Muscle IA Fib PSW Fasc Other Amp Dur. Poly Pattern  R. Vastus medialis Normal None None None _______ Normal Normal Normal Normal  R. Tibialis anterior Normal None None None _______ Normal Normal Normal Normal  R. Gastrocnemius (Medial head) Normal None None None _______ Normal Normal Normal Normal

## 2020-01-04 NOTE — Progress Notes (Signed)
Patient Care Team: [name] as PCP - General (Physician Assistant)  DIAGNOSIS:  Encounter Diagnoses  Name Primary?  . Iron deficiency anemia, unspecified iron deficiency anemia type Yes  . Pernicious anemia     CHIEF COMPLIANT: Follow-up after recent rheumatology appointment  INTERVAL HISTORY: Meredith [name] is a 39 year old with above-mentioned history of pernicious anemia who is receiving a weekly B12 injections and she is here to receive a B12 shot today.  She was referred to rheumatology at [facility] with [facility] system who did not feel that she has rheumatoid arthritis and sent her home.  She is quite upset with that appointment since apparently they were slightly rude.  She wanted me to know about what happened and to discuss her B12 injection therapy.  Patient is due for another B12 injection next week and after that she will do monthly B12 shots.  She continues to have right-sided chest and upper and lower extremity pain.   ALLERGIES:  is allergic to morphine and related and promethazine hcl.  MEDICATIONS:  Current Outpatient Medications  Medication Sig Dispense Refill  . Cyanocobalamin (B-12 IJ) Inject as directed. 2 x weekly    . ergocalciferol (VITAMIN D2) 1.25 MG (50000 UT) capsule Take 1 capsule (50,000 Units total) by mouth once a week. 30 capsule 6   No current facility-administered medications for this visit.    PHYSICAL EXAMINATION: ECOG PERFORMANCE STATUS: 2 - Symptomatic, <50% confined to bed  Vitals:   01/04/20 1315  BP: (!) 101/59  Pulse: 89  Resp: 16  Temp: 98.2 F (36.8 C)  SpO2: 100%   Filed Weights   01/04/20 1315  Weight: 169 lb 6.4 oz (76.8 kg)      LABORATORY DATA:  I have reviewed the data as listed CMP Latest Ref Rng & Units 10/27/2019 07/15/2014 10/27/2012  Glucose 70 - 99 mg/dL 97 195(K) 89  BUN 6 - 20 mg/dL 11 17 15   Creatinine 0.44 - 1.00 mg/dL 9.32 6.71  Sodium 135 - 145 mmol/L 140 135 141   Potassium 3.5 - 5.1 mmol/L 4.1 3.6 4.7  Chloride 98 - 111 mmol/L 105 105 105  CO2 22 - 32 mmol/L 27 25 26   Calcium 8.9 - 10.3 mg/dL 9.1 9.1 9.7  Total Protein 6.5 - 8.1 g/dL 7.3 8.1 7.2  Total Bilirubin 0.3 - 1.2 mg/dL 0.5 0.8 0.5  Alkaline Phos 38 - 126 U/L 69 76 73  AST 15 - 41 U/L 13(L) 21 20  ALT 0 - 44 U/L 11 24 31     Lab Results  Component Value Date   WBC 8.0 12/21/2019   HGB 12.9 12/21/2019   HCT 38.8 12/21/2019   MCV 87.8 12/21/2019   PLT 290 12/21/2019   NEUTROABS 5.2 12/21/2019    ASSESSMENT & PLAN:   Pernicious anemia Current treatment: Monthly B12 injections after next week. Previous history of iron deficiency anemia: We will check iron studies. Return to clinic to repeat labs and follow-up with me.  Patient saw rheumatology and does not feel like they have helped her. Patient is currently seeing neurology for her right-sided pain and weakness.  Recheck blood counts and follow-up.    Orders Placed This Encounter  Procedures  . Ferritin    Standing Status:   Future    Standing Expiration Date:   01/03/2021  . Iron and TIBC    Standing Status:   Future    Standing Expiration Date:   01/03/2021  .  CBC with Differential (Cancer Center Only)    Standing Status:   Future    Standing Expiration Date:   01/03/2021   The patient has a good understanding of the overall plan. she agrees with it. she will call with any problems that may develop before the next visit here. Total time spent: 30 mins including face to face time and time spent for planning, charting and co-ordination of care   Harriette Ohara, MD 01/04/20

## 2020-01-05 ENCOUNTER — Telehealth: Payer: Self-pay | Admitting: Hematology and Oncology

## 2020-01-05 NOTE — Telephone Encounter (Signed)
Scheduled appt per 6/24 los. Pt confirmed new appt time.

## 2020-01-05 NOTE — Telephone Encounter (Signed)
Scheduled per los, patient has been called and notified. 

## 2020-01-11 ENCOUNTER — Other Ambulatory Visit: Payer: Self-pay | Admitting: *Deleted

## 2020-01-11 ENCOUNTER — Inpatient Hospital Stay: Payer: Medicaid Other | Attending: Hematology and Oncology

## 2020-01-11 ENCOUNTER — Other Ambulatory Visit: Payer: Self-pay

## 2020-01-11 VITALS — BP 115/75 | HR 84 | Resp 18

## 2020-01-11 DIAGNOSIS — D8989 Other specified disorders involving the immune mechanism, not elsewhere classified: Secondary | ICD-10-CM

## 2020-01-11 DIAGNOSIS — D510 Vitamin B12 deficiency anemia due to intrinsic factor deficiency: Secondary | ICD-10-CM | POA: Insufficient documentation

## 2020-01-11 DIAGNOSIS — E538 Deficiency of other specified B group vitamins: Secondary | ICD-10-CM

## 2020-01-11 MED ORDER — CYANOCOBALAMIN 1000 MCG/ML IJ SOLN
INTRAMUSCULAR | Status: AC
  Filled 2020-01-11: qty 1

## 2020-01-11 MED ORDER — CYANOCOBALAMIN 1000 MCG/ML IJ SOLN
1000.0000 ug | Freq: Once | INTRAMUSCULAR | Status: AC
  Administered 2020-01-11: 1000 ug via INTRAMUSCULAR

## 2020-01-11 NOTE — Patient Instructions (Signed)

## 2020-01-11 NOTE — Progress Notes (Signed)
Pt requesting rheumatology referral be placed to Dr. Corliss Skains.  Referral successfully faxed to [phone]

## 2020-01-18 ENCOUNTER — Other Ambulatory Visit: Payer: Self-pay

## 2020-01-18 ENCOUNTER — Inpatient Hospital Stay: Payer: Medicaid Other

## 2020-01-18 ENCOUNTER — Other Ambulatory Visit: Payer: Medicaid Other

## 2020-01-18 VITALS — BP 112/72 | HR 83 | Resp 18

## 2020-01-18 DIAGNOSIS — D509 Iron deficiency anemia, unspecified: Secondary | ICD-10-CM

## 2020-01-18 DIAGNOSIS — D510 Vitamin B12 deficiency anemia due to intrinsic factor deficiency: Secondary | ICD-10-CM | POA: Diagnosis not present

## 2020-01-18 DIAGNOSIS — E538 Deficiency of other specified B group vitamins: Secondary | ICD-10-CM

## 2020-01-18 LAB — CBC WITH DIFFERENTIAL (CANCER CENTER ONLY)
Abs Immature Granulocytes: 0.02 10*3/uL (ref 0.00–0.07)
Basophils Absolute: 0.1 10*3/uL (ref 0.0–0.1)
Basophils Relative: 1 %
Eosinophils Absolute: 0.2 10*3/uL (ref 0.0–0.5)
Eosinophils Relative: 2 %
HCT: 42.3 % (ref 36.0–46.0)
Hemoglobin: 14.1 g/dL (ref 12.0–15.0)
Immature Granulocytes: 0 %
Lymphocytes Relative: 25 %
Lymphs Abs: 2.4 10*3/uL (ref 0.7–4.0)
MCH: 29.9 pg (ref 26.0–34.0)
MCHC: 33.3 g/dL (ref 30.0–36.0)
MCV: 89.6 fL (ref 80.0–100.0)
Monocytes Absolute: 0.6 10*3/uL (ref 0.1–1.0)
Monocytes Relative: 6 %
Neutro Abs: 6.2 10*3/uL (ref 1.7–7.7)
Neutrophils Relative %: 66 %
Platelet Count: 345 10*3/uL (ref 150–400)
RBC: 4.72 MIL/uL (ref 3.87–5.11)
RDW: 12.2 % (ref 11.5–15.5)
WBC Count: 9.4 10*3/uL (ref 4.0–10.5)
nRBC: 0 % (ref 0.0–0.2)

## 2020-01-18 LAB — IRON AND TIBC
Iron: 62 ug/dL (ref 41–142)
Saturation Ratios: 19 % — ABNORMAL LOW (ref 21–57)
TIBC: 331 ug/dL (ref 236–444)
UIBC: 269 ug/dL (ref 120–384)

## 2020-01-18 LAB — FERRITIN: Ferritin: 17 ng/mL (ref 11–307)

## 2020-01-18 MED ORDER — CYANOCOBALAMIN 1000 MCG/ML IJ SOLN
1000.0000 ug | Freq: Once | INTRAMUSCULAR | Status: AC
  Administered 2020-01-18: 1000 ug via INTRAMUSCULAR

## 2020-01-18 NOTE — Patient Instructions (Signed)

## 2020-02-10 ENCOUNTER — Encounter (HOSPITAL_COMMUNITY): Payer: Self-pay

## 2020-02-10 ENCOUNTER — Ambulatory Visit (HOSPITAL_COMMUNITY)
Admission: EM | Admit: 2020-02-10 | Discharge: 2020-02-10 | Disposition: A | Discharge status: Home / Self Care | Payer: Medicaid Other | Attending: Family Medicine | Admitting: Family Medicine

## 2020-02-10 DIAGNOSIS — R110 Nausea: Secondary | ICD-10-CM

## 2020-02-10 DIAGNOSIS — M545 Low back pain, unspecified: Secondary | ICD-10-CM

## 2020-02-10 DIAGNOSIS — R109 Unspecified abdominal pain: Secondary | ICD-10-CM | POA: Diagnosis not present

## 2020-02-10 LAB — POCT URINALYSIS DIP (DEVICE)
Glucose, UA: NEGATIVE mg/dL
Ketones, ur: NEGATIVE mg/dL
Leukocytes,Ua: NEGATIVE
Nitrite: NEGATIVE
Protein, ur: NEGATIVE mg/dL
Specific Gravity, Urine: >=1.03 (ref 1.005–1.030)
Urobilinogen, UA: 1 mg/dL (ref 0.0–1.0)
pH: 6.5 (ref 5.0–8.0)

## 2020-02-10 LAB — POC URINE PREG, ED: Preg Test, Ur: NEGATIVE

## 2020-02-10 MED ORDER — LIDOCAINE HCL (PF) 1 % IJ SOLN
INTRAMUSCULAR | Status: AC
  Filled 2020-02-10: qty 2

## 2020-02-10 MED ORDER — CEFTRIAXONE SODIUM 500 MG IJ SOLR
500.0000 mg | Freq: Once | INTRAMUSCULAR | Status: AC
  Administered 2020-02-10: 500 mg via INTRAMUSCULAR

## 2020-02-10 MED ORDER — CEFTRIAXONE SODIUM 500 MG IJ SOLR
INTRAMUSCULAR | Status: AC
  Filled 2020-02-10: qty 500

## 2020-02-10 MED ORDER — DOXYCYCLINE HYCLATE 100 MG PO CAPS
100.0000 mg | ORAL_CAPSULE | Freq: Two times a day (BID) | ORAL | 0 refills | Status: DC
Start: 2020-02-10 — End: 2020-03-21

## 2020-02-10 NOTE — Discharge Instructions (Addendum)
You have been given the following today for treatment of suspected gonorrhea and/or chlamydia:  cefTRIAXone (ROCEPHIN) injection 500 mg  Please pick up your prescription for doxycycline 100 mg and begin taking twice daily for the next seven (7) days.  Even though we have treated you today, we have sent testing for sexually transmitted infections. We will notify you of any positive results once they are received. If required, we will prescribe any medications you might need.  Please refrain from all sexual activity for at least the next seven days.  You have been seen today for abdominal pain. Your evaluation was not suggestive of any emergent condition requiring medical intervention at this time. However, some abdominal problems make take more time to appear. Therefore, it is very important for you to pay attention to any new symptoms or worsening of your current condition.  Please return here or to the Emergency Department immediately should you begin to feel worse in any way or have any of the following symptoms: increasing or different abdominal pain, persistent vomiting, inability to drink fluids, fevers, or shaking chills.

## 2020-02-10 NOTE — ED Triage Notes (Signed)
Pt presents with abdominal discomfort, lowe back pain and nausea x 2 days. Pt has not tried any medications for the complaints.   Pt requested STD's test.

## 2020-02-11 LAB — URINE CULTURE: Culture: NO GROWTH

## 2020-02-12 LAB — CERVICOVAGINAL ANCILLARY ONLY
Bacterial Vaginitis (gardnerella): POSITIVE — AB
Candida Glabrata: NEGATIVE
Candida Vaginitis: NEGATIVE
Chlamydia: NEGATIVE
Comment: NEGATIVE
Comment: NEGATIVE
Comment: NEGATIVE
Comment: NEGATIVE
Comment: NEGATIVE
Comment: NORMAL
Neisseria Gonorrhea: NEGATIVE
Trichomonas: NEGATIVE

## 2020-02-12 NOTE — ED Provider Notes (Signed)
[facility]   [id number] 02/10/20 Arrival Time: 1005  ASSESSMENT & PLAN:  1. Abdominal discomfort   2. Acute bilateral low back pain without sciatica   3. Nausea     Benign abdominal exam. No indications for urgent abdominal/pelvic imaging at this time. Discussed. Urine culture and vaginal cytology pending.   Discharge Instructions     You have been given the following today for treatment of suspected gonorrhea and/or chlamydia:  cefTRIAXone (ROCEPHIN) injection 500 mg  Please pick up your prescription for doxycycline 100 mg and begin taking twice daily for the next seven (7) days.  Even though we have treated you today, we have sent testing for sexually transmitted infections. We will notify you of any positive results once they are received. If required, we will prescribe any medications you might need.  Please refrain from all sexual activity for at least the next seven days.  You have been seen today for abdominal pain. Your evaluation was not suggestive of any emergent condition requiring medical intervention at this time. However, some abdominal problems make take more time to appear. Therefore, it is very important for you to pay attention to any new symptoms or worsening of your current condition.  Please return here or to the Emergency Department immediately should you begin to feel worse in any way or have any of the following symptoms: increasing or different abdominal pain, persistent vomiting, inability to drink fluids, fevers, or shaking chills.      Follow-up Information    Hillery Aldo, PA-C.   Specialty: Physician Assistant Why: As needed. Contact information: [address] [place] [address] [phone]              Meds ordered this encounter  Medications  . cefTRIAXone (ROCEPHIN) injection 500 mg  . doxycycline (VIBRAMYCIN) 100 MG capsule    Sig: Take 1 capsule (100 mg total) by mouth 2 (two) times daily.     Dispense:  14 capsule    Refill:  0     Reviewed expectations re: course of current medical issues. Questions answered. Outlined signs and symptoms indicating need for more acute intervention. Patient verbalized understanding. After Visit Summary given.   SUBJECTIVE: History from: patient. Meredith [name] is a 39 y.o. female who reports lower but sometimes generalized abdominal discomfort, mild lower back discomfort, and occasional nausea without emesis. First noted 2 d ago. No worsening. No specific aggravating or alleviating factors reported. Not related to PO intake; appetite ok. Is sexually active. No speciric vaginal d/c. No dysuria. Ambulatory without assistance. Normal bowel movements.   Patient's last menstrual period was 02/05/2020 (exact date).   Past Surgical History:  Procedure Laterality Date  . CESAREAN SECTION  jan 2009  . CESAREAN SECTION WITH BILATERAL TUBAL LIGATION N/A 04/07/2014   Procedure: CESAREAN SECTION WITH BILATERAL TUBAL LIGATION;  Surgeon: Sharon Seller, DO;  Location: [facility] ORS;  Service: Obstetrics;  Laterality: N/A;  . mirena iud N/A 2009  . NEPHROLITHOTOMY  08/03/2011   Procedure: NEPHROLITHOTOMY PERCUTANEOUS;  Surgeon: Kathi Ludwig, MD;  Location: [facility] ORS;  Service: Urology;  Laterality: Left;  . NEPHROSTOMY  08/03/11   left  . right wrist surgery  2005   I and D , infection to the bone  . WISDOM TOOTH EXTRACTION  2006     OBJECTIVE:  Vitals:   02/10/20 1018  BP: 114/78  Pulse: 82  Resp: 16  Temp: 98.2 F (36.8 C)  TempSrc: Oral  SpO2: 99%    General appearance: alert, oriented, no acute distress HEENT: [place]; AT; oropharynx moist Lungs: unlabored respirations Abdomen: soft; without distention; mild  and poorly localized tenderness to palpation over over lower abdomen; normal bowel sounds; without masses or organomegaly; without guarding or rebound tenderness Back: without reported CVA tenderness; FROM at waist Extremities:  without LE edema; symmetrical; without gross deformities Skin: warm and dry Neurologic: normal gait Psychological: alert and cooperative; normal mood and affect  Labs:  Labs Reviewed  POCT URINALYSIS DIP (DEVICE) - Abnormal; Notable for the following components:      Result Value   Bilirubin Urine SMALL (*)    Hgb urine dipstick MODERATE (*)    All other components within normal limits  URINE CULTURE  POC URINE PREG, ED  CERVICOVAGINAL ANCILLARY ONLY      Allergies  Allergen Reactions  . Morphine And Related Nausea And Vomiting    Pt can take percocet without problems  . Promethazine Hcl Other (See Comments)    hypotention                                               Past Medical History:  Diagnosis Date  . Iron deficiency anemia    pernicious anemia  . Pyelonephritis 06/21/2011   10 kidney stones surgically removed  . 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 Marital status: Married    Spouse name: Renda Rolls  . Number of children: 2  . Years of education: Not on file  . Highest education level: Associate degree: academic program  Occupational History    Comment: dental and medical assistant  Tobacco Use  . Smoking status: Never Smoker  . Smokeless tobacco: Never Used  Vaping Use  . Vaping Use: Never used  Substance and Sexual Activity  . Alcohol use: Yes    Comment: occas  . Drug use: No  . Sexual activity: Yes    Partners: Male    Birth control/protection: I.U.D.    Comment: BTL  Other Topics Concern  . Not on file  Social History Narrative   Lives with family   Social Determinants of Health   Financial Resource Strain:   . Difficulty of Paying Living Expenses:   Food Insecurity:   . Worried About Programme researcher, broadcasting/film/video in the Last Year:   . Barista in the Last Year:   Transportation Needs:   . Freight forwarder (Medical):   Marland Kitchen Lack of Transportation (Non-Medical):   Physical  Activity:   . Days of Exercise per Week:   . Minutes of Exercise per Session:   Stress:   . Feeling of Stress :   Social Connections:   . Frequency of Communication with Friends and Family:   . Frequency of Social Gatherings with Friends and Family:   . Attends Religious Services:   . Active Member of Clubs or Organizations:   . Attends Banker Meetings:   Marland Kitchen Marital Status:   Intimate Partner Violence:   . Fear of Current or Ex-Partner:   . Emotionally Abused:   Marland Kitchen Physically Abused:   . Sexually Abused:     Family History  Problem Relation Age of Onset  . Urolithiasis Mother   . Hypertension Maternal Grandmother   .  Thyroid disease Maternal Grandmother   . Dementia Maternal Grandmother   . Heart disease Maternal Grandfather   . Diabetes Maternal Glynda Jaeger, MD 02/12/20 [phone]

## 2020-02-19 ENCOUNTER — Telehealth (HOSPITAL_COMMUNITY): Payer: Self-pay

## 2020-02-19 MED ORDER — METRONIDAZOLE 500 MG PO TABS: 500.0000 mg | ORAL_TABLET | Freq: Two times a day (BID) | ORAL | 0 refills | Status: DC

## 2020-02-20 ENCOUNTER — Telehealth: Payer: Self-pay

## 2020-02-20 DIAGNOSIS — N898 Other specified noninflammatory disorders of vagina: Secondary | ICD-10-CM

## 2020-02-20 MED ORDER — FLUCONAZOLE 150 MG PO TABS: ORAL_TABLET | ORAL | 0 refills | Status: DC

## 2020-02-20 NOTE — Telephone Encounter (Signed)
Pt states she has started abx and now reports white, yeasty vaginal discharge.  Feels she has yeast infection.  Sending Diflucan 150mg .  Advised to f/u with PCP/OBGYN/UC if symptoms worsen or no improvement with meds.

## 2020-02-21 ENCOUNTER — Other Ambulatory Visit: Payer: Self-pay | Admitting: *Deleted

## 2020-02-21 DIAGNOSIS — D509 Iron deficiency anemia, unspecified: Secondary | ICD-10-CM

## 2020-02-22 ENCOUNTER — Inpatient Hospital Stay: Payer: Medicaid Other

## 2020-03-19 ENCOUNTER — Inpatient Hospital Stay: Payer: Medicaid Other | Attending: Hematology and Oncology

## 2020-03-19 ENCOUNTER — Other Ambulatory Visit: Payer: Self-pay

## 2020-03-19 DIAGNOSIS — D510 Vitamin B12 deficiency anemia due to intrinsic factor deficiency: Secondary | ICD-10-CM | POA: Insufficient documentation

## 2020-03-19 DIAGNOSIS — D509 Iron deficiency anemia, unspecified: Secondary | ICD-10-CM | POA: Diagnosis not present

## 2020-03-19 DIAGNOSIS — D8989 Other specified disorders involving the immune mechanism, not elsewhere classified: Secondary | ICD-10-CM

## 2020-03-19 DIAGNOSIS — E538 Deficiency of other specified B group vitamins: Secondary | ICD-10-CM

## 2020-03-19 DIAGNOSIS — M792 Neuralgia and neuritis, unspecified: Secondary | ICD-10-CM

## 2020-03-19 LAB — VITAMIN B12: Vitamin B-12: 316 pg/mL (ref 180–914)

## 2020-03-19 LAB — CBC WITH DIFFERENTIAL/PLATELET
Abs Immature Granulocytes: 0.02 10*3/uL (ref 0.00–0.07)
Basophils Absolute: 0.1 10*3/uL (ref 0.0–0.1)
Basophils Relative: 1 %
Eosinophils Absolute: 0.2 10*3/uL (ref 0.0–0.5)
Eosinophils Relative: 2 %
HCT: 40.7 % (ref 36.0–46.0)
Hemoglobin: 13.2 g/dL (ref 12.0–15.0)
Immature Granulocytes: 0 %
Lymphocytes Relative: 25 %
Lymphs Abs: 2.6 10*3/uL (ref 0.7–4.0)
MCH: 28.9 pg (ref 26.0–34.0)
MCHC: 32.4 g/dL (ref 30.0–36.0)
MCV: 89.3 fL (ref 80.0–100.0)
Monocytes Absolute: 0.9 10*3/uL (ref 0.1–1.0)
Monocytes Relative: 9 %
Neutro Abs: 6.6 10*3/uL (ref 1.7–7.7)
Neutrophils Relative %: 63 %
Platelets: 351 10*3/uL (ref 150–400)
RBC: 4.56 MIL/uL (ref 3.87–5.11)
RDW: 12.5 % (ref 11.5–15.5)
WBC: 10.4 10*3/uL (ref 4.0–10.5)
nRBC: 0 % (ref 0.0–0.2)

## 2020-03-19 LAB — IRON AND TIBC
Iron: 43 ug/dL (ref 41–142)
Saturation Ratios: 12 % — ABNORMAL LOW (ref 21–57)
TIBC: 365 ug/dL (ref 236–444)
UIBC: 322 ug/dL (ref 120–384)

## 2020-03-19 LAB — FERRITIN: Ferritin: 8 ng/mL — ABNORMAL LOW (ref 11–307)

## 2020-03-19 LAB — VITAMIN D 25 HYDROXY (VIT D DEFICIENCY, FRACTURES): Vit D, 25-Hydroxy: 31.66 ng/mL (ref 30–100)

## 2020-03-20 NOTE — Progress Notes (Signed)
Patient Care Team: [name] as PCP - General (Physician Assistant)  DIAGNOSIS:    ICD-10-CM   1. Iron deficiency anemia, unspecified iron deficiency anemia type  D50.9 CBC with Differential (Cancer Center Only)    Ferritin    Iron and TIBC    Vitamin B12  2. Pernicious anemia  D51.0 CBC with Differential (Cancer Center Only)    Ferritin    Iron and TIBC    Vitamin B12     CHIEF COMPLIANT: Follow-up of pernicious anemia and B-12 deficiency  INTERVAL HISTORY: Meredith [name] is a 39 y.o. with above-mentioned history of pernicious anemia who is receiving a weekly B12 injections. Labs on 03/19/20 showed Hg 13.2, HCT 40.7, platelets 351, iron saturation 12%, ferritin 8. She presents to the clinic today for follow-up.  She has a very painful and swollen left arm.  She is also extremely fatigued to the point of exhaustion.  ALLERGIES:  is allergic to morphine and related and promethazine hcl.  MEDICATIONS:  Current Outpatient Medications  Medication Sig Dispense Refill  . Cyanocobalamin (B-12 IJ) Inject as directed. 2 x weekly    . ergocalciferol (VITAMIN D2) 1.25 MG (50000 UT) capsule Take 1 capsule (50,000 Units total) by mouth once a week. 30 capsule 6  . predniSONE (STERAPRED UNI-PAK 21 TAB) 10 MG (21) TBPK tablet Take 4 tabs and taper down over 7 days 21 tablet 0   No current facility-administered medications for this visit.    PHYSICAL EXAMINATION: ECOG PERFORMANCE STATUS: 1 - Symptomatic but completely ambulatory  Vitals:   03/21/20 1215  BP: 109/75  Pulse: 93  Resp: 18  Temp: 99 F (37.2 C)  SpO2: 99%   Filed Weights   03/21/20 1215  Weight: 168 lb 4.8 oz (76.3 kg)    LABORATORY DATA:  I have reviewed the data as listed CMP Latest Ref Rng & Units 10/27/2019 07/15/2014 10/27/2012  Glucose 70 - 99 mg/dL 97 202(R) 89  BUN 6 - 20 mg/dL 11 17 15   Creatinine 0.44 - 1.00 mg/dL 4.27 0.62  Sodium 135 - 145 mmol/L 140 135 141  Potassium 3.5 -  5.1 mmol/L 4.1 3.6 4.7  Chloride 98 - 111 mmol/L 105 105 105  CO2 22 - 32 mmol/L 27 25 26   Calcium 8.9 - 10.3 mg/dL 9.1 9.1 9.7  Total Protein 6.5 - 8.1 g/dL 7.3 8.1 7.2  Total Bilirubin 0.3 - 1.2 mg/dL 0.5 0.8 0.5  Alkaline Phos 38 - 126 U/L 69 76 73  AST 15 - 41 U/L 13(L) 21 20  ALT 0 - 44 U/L 11 24 31     Lab Results  Component Value Date   WBC 10.4 03/19/2020   HGB 13.2 03/19/2020   HCT 40.7 03/19/2020   MCV 89.3 03/19/2020   PLT 351 03/19/2020   NEUTROABS 6.6 03/19/2020    ASSESSMENT & PLAN:  Pernicious anemia Current treatment: Monthly B12 injections   Iron deficiency anemia Lab review 03/19/2020: Iron saturation 12%, ferritin 8, B12 316, rheumatoid factor negative, vitamin D 31, hemoglobin 13.2, MCV 89.3  Iron deficiency without anemia: I recommended that she received IV iron treatment because of her profound symptoms of fatigue. We will set her up for IV iron infusions Painful and swollen left thumb: I gave her a prescription for prednisone with a tapering schedule. Rheumatoid arthritis testing was negative.  Patient wants to see rheumatology at [facility].  We will send him a referral. Return to  clinic in 3 months with labs done ahead of time and follow-up.   Orders Placed This Encounter  Procedures  . CBC with Differential (Cancer Center Only)    Standing Status:   Future    Standing Expiration Date:   03/21/2021  . Ferritin    Standing Status:   Future    Standing Expiration Date:   03/21/2021  . Iron and TIBC    Standing Status:   Future    Standing Expiration Date:   03/21/2021  . Vitamin B12    Standing Status:   Future    Standing Expiration Date:   03/21/2021   The patient has a good understanding of the overall plan. she agrees with it. she will call with any problems that may develop before the next visit here.  Total time spent: 20 mins including face to face time and time spent for planning, charting and coordination of  care  Serena Croissant, MD 03/21/2020  I, Kirt Boys Dorshimer, am acting as scribe for Dr. Serena Croissant.  I have reviewed the above documentation for accuracy and completeness, and I agree with the above.

## 2020-03-21 ENCOUNTER — Ambulatory Visit: Payer: Medicaid Other

## 2020-03-21 ENCOUNTER — Telehealth: Payer: Self-pay | Admitting: Hematology and Oncology

## 2020-03-21 ENCOUNTER — Inpatient Hospital Stay: Payer: Medicaid Other

## 2020-03-21 ENCOUNTER — Other Ambulatory Visit: Payer: Self-pay

## 2020-03-21 ENCOUNTER — Inpatient Hospital Stay (HOSPITAL_BASED_OUTPATIENT_CLINIC_OR_DEPARTMENT_OTHER): Payer: Medicaid Other | Admitting: Hematology and Oncology

## 2020-03-21 VITALS — BP 109/75 | HR 93 | Temp 99.0°F | Resp 18 | Ht 62.0 in | Wt 168.3 lb

## 2020-03-21 DIAGNOSIS — D510 Vitamin B12 deficiency anemia due to intrinsic factor deficiency: Secondary | ICD-10-CM

## 2020-03-21 DIAGNOSIS — E538 Deficiency of other specified B group vitamins: Secondary | ICD-10-CM

## 2020-03-21 DIAGNOSIS — D509 Iron deficiency anemia, unspecified: Secondary | ICD-10-CM

## 2020-03-21 LAB — RHEUMATOID ARTHRITIS PROFILE
CCP Antibodies IgG/IgA: 7 units (ref 0–19)
Rheumatoid fact SerPl-aCnc: <10 IU/mL (ref 0.0–13.9)

## 2020-03-21 MED ORDER — CYANOCOBALAMIN 1000 MCG/ML IJ SOLN
INTRAMUSCULAR | Status: AC
  Filled 2020-03-21: qty 1

## 2020-03-21 MED ORDER — CYANOCOBALAMIN 1000 MCG/ML IJ SOLN
1000.0000 ug | Freq: Once | INTRAMUSCULAR | Status: AC
  Administered 2020-03-21: 1000 ug via INTRAMUSCULAR

## 2020-03-21 MED ORDER — PREDNISONE 10 MG (21) PO TBPK: ORAL_TABLET | ORAL | 0 refills | Status: DC

## 2020-03-21 NOTE — Telephone Encounter (Signed)
Scheduled appts per 9/9 los. Pt stated she would refer to mychart for AVS and appt info.

## 2020-03-21 NOTE — Assessment & Plan Note (Signed)
Lab review 03/19/2020: Iron saturation 12%, ferritin 8, B12 316, rheumatoid factor negative, vitamin D 31, hemoglobin 13.2, MCV 89.3  Iron deficiency without anemia: I recommended that she received IV iron treatment because of her profound symptoms of fatigue. We will set her up for IV iron infusions

## 2020-03-21 NOTE — Assessment & Plan Note (Signed)
Current treatment: Monthly B12 injections

## 2020-03-29 ENCOUNTER — Other Ambulatory Visit: Payer: Self-pay

## 2020-03-29 ENCOUNTER — Inpatient Hospital Stay: Payer: Medicaid Other

## 2020-03-29 VITALS — BP 103/64 | HR 88 | Temp 98.6°F | Resp 18

## 2020-03-29 DIAGNOSIS — E538 Deficiency of other specified B group vitamins: Secondary | ICD-10-CM

## 2020-03-29 DIAGNOSIS — D509 Iron deficiency anemia, unspecified: Secondary | ICD-10-CM | POA: Diagnosis not present

## 2020-03-29 MED ORDER — SODIUM CHLORIDE 0.9 % IV SOLN
Freq: Once | INTRAVENOUS | Status: AC
  Filled 2020-03-29: qty 250

## 2020-03-29 MED ORDER — SODIUM CHLORIDE 0.9 % IV SOLN
510.0000 mg | Freq: Once | INTRAVENOUS | Status: AC
  Administered 2020-03-29: 510 mg via INTRAVENOUS
  Filled 2020-03-29: qty 510

## 2020-03-29 NOTE — Patient Instructions (Signed)

## 2020-04-05 ENCOUNTER — Other Ambulatory Visit: Payer: Self-pay

## 2020-04-05 ENCOUNTER — Inpatient Hospital Stay: Payer: Medicaid Other

## 2020-04-05 VITALS — BP 101/66 | HR 85 | Temp 98.6°F | Resp 17

## 2020-04-05 DIAGNOSIS — E538 Deficiency of other specified B group vitamins: Secondary | ICD-10-CM

## 2020-04-05 DIAGNOSIS — D509 Iron deficiency anemia, unspecified: Secondary | ICD-10-CM | POA: Diagnosis not present

## 2020-04-05 MED ORDER — SODIUM CHLORIDE 0.9 % IV SOLN
510.0000 mg | Freq: Once | INTRAVENOUS | Status: AC
  Administered 2020-04-05: 510 mg via INTRAVENOUS
  Filled 2020-04-05: qty 510

## 2020-04-05 MED ORDER — SODIUM CHLORIDE 0.9 % IV SOLN
Freq: Once | INTRAVENOUS | Status: AC
  Filled 2020-04-05: qty 250

## 2020-04-05 NOTE — Patient Instructions (Signed)

## 2020-04-26 ENCOUNTER — Other Ambulatory Visit: Payer: Self-pay

## 2020-04-30 ENCOUNTER — Telehealth: Payer: Self-pay | Admitting: *Deleted

## 2020-04-30 NOTE — Telephone Encounter (Signed)
Received call from pt with complaint of increase in shortness of breat r/t pulmonary nodules found on recent chest CT.  Pt requesting to be seen by MD for possible pulmonary and cardiology consults.  Pt states her PCP is leaving the practice and has refused to send referrals for pt.  Pt states she would also like to discuss recent hand biopsy results with MD.  Boneta Lucks scheduled and pt verbalized understanding of apt date and time.

## 2020-05-06 NOTE — Progress Notes (Signed)
Patient Care Team: Etter Sjogren as PCP - General (Physician Assistant)  DIAGNOSIS:    ICD-10-CM   1. Shortness of breath  R06.02 Ambulatory referral to Pulmonology    Ambulatory referral to Cardiology  2. Pernicious anemia  D51.0   3. Iron deficiency anemia, unspecified iron deficiency anemia type  D50.9     CHIEF COMPLIANT: Follow-up of pernicious anemia and B-12 deficiency  INTERVAL HISTORY: Meredith [name] is a 39 y.o. with above-mentioned history of pernicious anemia who is receiving a weekly B12 injections. She presents to the clinic today for follow-up. She tells me that in spite of the IV iron treatment she has not felt any better.  She can sleep continuously.  There is no energy.  She is even feeling shortness of breath to minimal exertion.  She feels like a slight chest pressure as well.  ALLERGIES:  is allergic to morphine and related and promethazine hcl.  MEDICATIONS:  Current Outpatient Medications  Medication Sig Dispense Refill  . Cyanocobalamin (B-12 IJ) Inject as directed. 2 x weekly    . ergocalciferol (VITAMIN D2) 1.25 MG (50000 UT) capsule Take 1 capsule (50,000 Units total) by mouth once a week. 30 capsule 6   No current facility-administered medications for this visit.    PHYSICAL EXAMINATION: ECOG PERFORMANCE STATUS: 1 - Symptomatic but completely ambulatory  Vitals:   05/07/20 1130  BP: 118/71  Pulse: 73  Resp: 18  Temp: 98.4 F (36.9 C)  SpO2: 100%   Filed Weights   05/07/20 1130  Weight: 171 lb 14.4 oz (78 kg)    LABORATORY DATA:  I have reviewed the data as listed CMP Latest Ref Rng & Units 10/27/2019 07/15/2014 10/27/2012  Glucose 70 - 99 mg/dL 97 108(H) 89  BUN 6 - 20 mg/dL '11 17 15  ' Creatinine 0.44 - 1.00 mg/dL 0.79 0.75 0.79  Sodium 135 - 145 mmol/L 140 135 141  Potassium 3.5 - 5.1 mmol/L 4.1 3.6 4.7  Chloride 98 - 111 mmol/L 105 105 105  CO2 22 - 32 mmol/L '27 25 26  ' Calcium 8.9 - 10.3 mg/dL 9.1 9.1 9.7  Total  Protein 6.5 - 8.1 g/dL 7.3 8.1 7.2  Total Bilirubin 0.3 - 1.2 mg/dL 0.5 0.8 0.5  Alkaline Phos 38 - 126 U/L 69 76 73  AST 15 - 41 U/L 13(L) 21 20  ALT 0 - 44 U/L '11 24 31    ' Lab Results  Component Value Date   WBC 8.2 05/07/2020   HGB 13.1 05/07/2020   HCT 39.4 05/07/2020   MCV 88.5 05/07/2020   PLT 285 05/07/2020   NEUTROABS 4.8 05/07/2020    ASSESSMENT & PLAN:  Pernicious anemia Current treatment: Monthly B12 injections   Iron deficiency anemia Lab review 03/19/2020: Iron saturation 12%, ferritin 8, B12 316, rheumatoid factor negative, vitamin D 31, hemoglobin 13.2, MCV 89.3  Iron deficiency without anemia:  Previous IV iron: September 2021 Labs will be repeated today.  Shortness of breath and chest pressure: I will send her to pulmonary and cardiology.  She tells me that her primary care physician is leaving the practice and she has not found a different primary care physician yet.  Rheumatoid arthritis testing was negative. Patient wants to see rheumatology at [facility].    I discussed with her that I am unsure about where to go from here. I am happy to refer her to [facility] or [facility] if she wishes to be evaluated there  for this profound fatigue. I discussed with her that we can do a bone marrow biopsy but her CBC is perfectly normal and therefore I am not sure that it is going to be of any benefit to her.  Return to clinic in 2 months with labs done ahead of time and follow-up.    Orders Placed This Encounter  Procedures  . Ambulatory referral to Pulmonology    Referral Priority:   Routine    Referral Type:   Consultation    Referral Reason:   Specialty Services Required    Requested Specialty:   Pulmonary Disease    Number of Visits Requested:   1  . Ambulatory referral to Cardiology    Referral Priority:   Routine    Referral Type:   Consultation    Referral Reason:   Specialty Services Required    Requested Specialty:   Cardiology    Number  of Visits Requested:   1   The patient has a good understanding of the overall plan. she agrees with it. she will call with any problems that may develop before the next visit here.  Total time spent: 30 mins including face to face time and time spent for planning, charting and coordination of care  Nicholas Lose, MD 05/07/2020  I, Cloyde Reams Dorshimer, am acting as scribe for Dr. Nicholas Lose.  I have reviewed the above documentation for accuracy and completeness, and I agree with the above.

## 2020-05-07 ENCOUNTER — Inpatient Hospital Stay: Payer: Medicaid Other | Attending: Hematology and Oncology | Admitting: Hematology and Oncology

## 2020-05-07 ENCOUNTER — Inpatient Hospital Stay: Payer: Medicaid Other

## 2020-05-07 ENCOUNTER — Encounter: Payer: Self-pay | Admitting: *Deleted

## 2020-05-07 ENCOUNTER — Other Ambulatory Visit: Payer: Self-pay

## 2020-05-07 VITALS — BP 118/71 | HR 73 | Temp 98.4°F | Resp 18 | Ht 62.0 in | Wt 171.9 lb

## 2020-05-07 DIAGNOSIS — D510 Vitamin B12 deficiency anemia due to intrinsic factor deficiency: Secondary | ICD-10-CM | POA: Diagnosis not present

## 2020-05-07 DIAGNOSIS — R0602 Shortness of breath: Secondary | ICD-10-CM | POA: Diagnosis not present

## 2020-05-07 DIAGNOSIS — D509 Iron deficiency anemia, unspecified: Secondary | ICD-10-CM

## 2020-05-07 LAB — CBC WITH DIFFERENTIAL (CANCER CENTER ONLY)
Abs Immature Granulocytes: 0.02 10*3/uL (ref 0.00–0.07)
Basophils Absolute: 0.1 10*3/uL (ref 0.0–0.1)
Basophils Relative: 1 %
Eosinophils Absolute: 0.2 10*3/uL (ref 0.0–0.5)
Eosinophils Relative: 2 %
HCT: 39.4 % (ref 36.0–46.0)
Hemoglobin: 13.1 g/dL (ref 12.0–15.0)
Immature Granulocytes: 0 %
Lymphocytes Relative: 31 %
Lymphs Abs: 2.5 10*3/uL (ref 0.7–4.0)
MCH: 29.4 pg (ref 26.0–34.0)
MCHC: 33.2 g/dL (ref 30.0–36.0)
MCV: 88.5 fL (ref 80.0–100.0)
Monocytes Absolute: 0.6 10*3/uL (ref 0.1–1.0)
Monocytes Relative: 8 %
Neutro Abs: 4.8 10*3/uL (ref 1.7–7.7)
Neutrophils Relative %: 58 %
Platelet Count: 285 10*3/uL (ref 150–400)
RBC: 4.45 MIL/uL (ref 3.87–5.11)
RDW: 13.2 % (ref 11.5–15.5)
WBC Count: 8.2 10*3/uL (ref 4.0–10.5)
nRBC: 0 % (ref 0.0–0.2)

## 2020-05-07 LAB — FERRITIN: Ferritin: 189 ng/mL (ref 11–307)

## 2020-05-07 LAB — IRON AND TIBC
Iron: 75 ug/dL (ref 41–142)
Saturation Ratios: 30 % (ref 21–57)
TIBC: 250 ug/dL (ref 236–444)
UIBC: 175 ug/dL (ref 120–384)

## 2020-05-07 LAB — VITAMIN B12: Vitamin B-12: 387 pg/mL (ref 180–914)

## 2020-05-07 NOTE — Progress Notes (Signed)
Per MD request, RN successfully faxed referral to [facility] heart care [phone]) for further evaluation and treatment of shortness of breath with unclear etiology.

## 2020-05-07 NOTE — Assessment & Plan Note (Signed)
Lab review 03/19/2020: Iron saturation 12%, ferritin 8, B12 316, rheumatoid factor negative, vitamin D 31, hemoglobin 13.2, MCV 89.3  Iron deficiency without anemia:  Previous IV iron: September 2021  Painful and swollen left thumb: I gave her a prescription for prednisone with a tapering schedule. Rheumatoid arthritis testing was negative.  Patient wants to see rheumatology at [facility].    Return to clinic in 3 months with labs done ahead of time and follow-up.

## 2020-05-07 NOTE — Assessment & Plan Note (Signed)
Current treatment: Monthly B12 injections

## 2020-05-09 ENCOUNTER — Telehealth: Payer: Self-pay

## 2020-05-09 NOTE — Telephone Encounter (Signed)
NOTES ON FILE FROM HEMATOLOGY/ [facility] [phone], SENT REFERRAL TO SCHEDULING

## 2020-05-13 ENCOUNTER — Other Ambulatory Visit: Payer: Self-pay | Admitting: *Deleted

## 2020-05-13 DIAGNOSIS — R0602 Shortness of breath: Secondary | ICD-10-CM

## 2020-05-13 NOTE — Progress Notes (Signed)
Referral sent to [facility]

## 2020-05-16 ENCOUNTER — Ambulatory Visit: Payer: Medicaid Other | Admitting: Pulmonary Disease

## 2020-05-16 ENCOUNTER — Other Ambulatory Visit: Payer: Self-pay

## 2020-05-16 ENCOUNTER — Encounter: Payer: Self-pay | Admitting: Pulmonary Disease

## 2020-05-16 VITALS — BP 104/70 | HR 78 | Temp 97.7°F | Ht 62.0 in | Wt 169.0 lb

## 2020-05-16 DIAGNOSIS — R0600 Dyspnea, unspecified: Secondary | ICD-10-CM

## 2020-05-16 DIAGNOSIS — R0602 Shortness of breath: Secondary | ICD-10-CM

## 2020-05-16 DIAGNOSIS — D509 Iron deficiency anemia, unspecified: Secondary | ICD-10-CM

## 2020-05-16 DIAGNOSIS — R911 Solitary pulmonary nodule: Secondary | ICD-10-CM

## 2020-05-16 DIAGNOSIS — Z6830 Body mass index (BMI) 30.0-30.9, adult: Secondary | ICD-10-CM | POA: Diagnosis not present

## 2020-05-16 DIAGNOSIS — D510 Vitamin B12 deficiency anemia due to intrinsic factor deficiency: Secondary | ICD-10-CM

## 2020-05-16 DIAGNOSIS — E538 Deficiency of other specified B group vitamins: Secondary | ICD-10-CM

## 2020-05-16 DIAGNOSIS — R0609 Other forms of dyspnea: Secondary | ICD-10-CM

## 2020-05-16 NOTE — Progress Notes (Signed)
Synopsis: Referred in Nov 2021 for SOB by Serena Croissant, MD  Subjective:   PATIENT ID: Meredith [name] GENDER: female DOB: [birth year], MRN: [id number]  Chief Complaint  Patient presents with  . Consult    SHOB and lung nodules---severe anemia---SHOB worse with talking    PMH of IDA and pernicous anemia. She has been worked up for SOB. She feels like she cant catch her breath. She is not active at her baseline.  She states that she received multiple iron transfusions to help keep her iron levels up in the past.  She does not notice much difference in her change of respiratory symptoms.  She does have shortness of breath with exertion.  She is a mother of 3 children, single mom at that.  When talking more about this she seems to be very stressed about everything going on at home.  Her son is fixed and joined the [employer] he is 38 years old.  She seems to state that a lot of her symptoms got worse when Covid started and she was unable to work at her job like she used to.   Past Medical History:  Diagnosis Date  . Iron deficiency anemia    pernicious anemia  . Pyelonephritis 06/21/2011   10 kidney stones surgically removed  . 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 Urolithiasis Mother   . Hypertension Maternal Grandmother   . Thyroid disease Maternal Grandmother   . Dementia Maternal Grandmother   . Heart disease Maternal Grandfather   . Diabetes Maternal Grandfather      Past Surgical History:  Procedure Laterality Date  . CESAREAN SECTION  jan 2009  . CESAREAN SECTION WITH BILATERAL TUBAL LIGATION N/A 04/07/2014   Procedure: CESAREAN SECTION WITH BILATERAL TUBAL LIGATION;  Surgeon: Sharon Seller, DO;  Location: [facility];  Service: Obstetrics;  Laterality: N/A;  . mirena iud N/A 2009  . NEPHROLITHOTOMY  08/03/2011   Procedure: NEPHROLITHOTOMY PERCUTANEOUS;  Surgeon: Kathi Ludwig, MD;  Location: [facility];  Service:  Urology;  Laterality: Left;  . NEPHROSTOMY  08/03/11   left  . right wrist surgery  2005   I and D , infection to the bone  . WISDOM TOOTH EXTRACTION  2006    Social History   Socioeconomic History  . Marital status: Married    Spouse name: Renda Rolls  . Number of children: 2  . Years of education: Not on file  . Highest education level: Associate degree: academic program  Occupational History    Comment: dental and medical assistant  Tobacco Use  . Smoking status: Never Smoker  . Smokeless tobacco: Never Used  Vaping Use  . Vaping Use: Never used  Substance and Sexual Activity  . Alcohol use: Yes    Comment: occas  . Drug use: No  . Sexual activity: Yes    Partners: Male    Birth control/protection: I.U.D.    Comment: BTL  Other Topics Concern  . Not on file  Social History Narrative   Lives with family   Social Determinants of Health   Financial Resource Strain:   . Difficulty of Paying Living Expenses: Not on file  Food Insecurity:   . Worried About Programme researcher, broadcasting/film/video in the Last Year: Not on file  . Ran Out of Food in the Last Year: Not on file  Transportation Needs:   . Lack  of Transportation (Medical): Not on file  . Lack of Transportation (Non-Medical): Not on file  Physical Activity:   . Days of Exercise per Week: Not on file  . Minutes of Exercise per Session: Not on file  Stress:   . Feeling of Stress : Not on file  Social Connections:   . Frequency of Communication with Friends and Family: Not on file  . Frequency of Social Gatherings with Friends and Family: Not on file  . Attends Religious Services: Not on file  . Active Member of Clubs or Organizations: Not on file  . Attends Banker Meetings: Not on file  . Marital Status: Not on file  Intimate Partner Violence:   . Fear of Current or Ex-Partner: Not on file  . Emotionally Abused: Not on file  . Physically Abused: Not on file  . Sexually Abused: Not on file      Allergies  Allergen Reactions  . Morphine And Related Nausea And Vomiting    Pt can take percocet without problems  . Promethazine Hcl Other (See Comments)    hypotention     Outpatient Medications Prior to Visit  Medication Sig Dispense Refill  . Cyanocobalamin (B-12 IJ) Inject as directed. 2 x weekly    . ergocalciferol (VITAMIN D2) 1.25 MG (50000 UT) capsule Take 1 capsule (50,000 Units total) by mouth once a week. 30 capsule 6  . ibuprofen (ADVIL) 800 MG tablet Take 800 mg by mouth 3 (three) times daily.     No facility-administered medications prior to visit.    Review of Systems  Constitutional: Negative for chills, fever, malaise/fatigue and weight loss.  HENT: Negative for hearing loss, sore throat and tinnitus.   Eyes: Negative for blurred vision and double vision.  Respiratory: Positive for shortness of breath. Negative for cough, hemoptysis, sputum production, wheezing and stridor.   Cardiovascular: Negative for chest pain, palpitations, orthopnea, leg swelling and PND.  Gastrointestinal: Negative for abdominal pain, constipation, diarrhea, heartburn, nausea and vomiting.  Genitourinary: Negative for dysuria, hematuria and urgency.  Musculoskeletal: Negative for joint pain and myalgias.  Skin: Negative for itching and rash.  Neurological: Negative for dizziness, tingling, weakness and headaches.  Endo/Heme/Allergies: Negative for environmental allergies. Does not bruise/bleed easily.  Psychiatric/Behavioral: Negative for depression. The patient is not nervous/anxious and does not have insomnia.   All other systems reviewed and are negative.    Objective:  Physical Exam Vitals reviewed.  Constitutional:      General: She is not in acute distress.    Appearance: She is well-developed. She is obese.  HENT:     Head: Normocephalic and atraumatic.  Eyes:     General: No scleral icterus.    Conjunctiva/sclera: Conjunctivae normal.     Pupils: Pupils are equal,  round, and reactive to light.  Neck:     Vascular: No JVD.     Trachea: No tracheal deviation.  Cardiovascular:     Rate and Rhythm: Normal rate and regular rhythm.     Heart sounds: Normal heart sounds. No murmur heard.   Pulmonary:     Effort: Pulmonary effort is normal. No tachypnea, accessory muscle usage or respiratory distress.     Breath sounds: Normal breath sounds. No stridor. No wheezing, rhonchi or rales.  Abdominal:     General: Bowel sounds are normal. There is no distension.     Palpations: Abdomen is soft.     Tenderness: There is no abdominal tenderness.  Musculoskeletal:  General: No tenderness.     Cervical back: Neck supple.  Lymphadenopathy:     Cervical: No cervical adenopathy.  Skin:    General: Skin is warm and dry.     Capillary Refill: Capillary refill takes less than 2 seconds.     Findings: No rash.  Neurological:     Mental Status: She is alert and oriented to person, place, and time.  Psychiatric:        Behavior: Behavior normal.      Vitals:   05/16/20 0950  BP: 104/70  Pulse: 78  Temp: 97.7 F (36.5 C)  TempSrc: Tympanic  SpO2: 100%  Weight: 169 lb (76.7 kg)  Height: 5\' 2"  (1.575 m)   100% on RA BMI Readings from Last 3 Encounters:  05/16/20 30.91 kg/m  05/07/20 31.44 kg/m  03/21/20 30.78 kg/m   Wt Readings from Last 3 Encounters:  05/16/20 169 lb (76.7 kg)  05/07/20 171 lb 14.4 oz (78 kg)  03/21/20 168 lb 4.8 oz (76.3 kg)     CBC    Component Value Date/Time   WBC 8.2 05/07/2020 1208   WBC 10.4 03/19/2020 0916   RBC 4.45 05/07/2020 1208   HGB 13.1 05/07/2020 1208   HCT 39.4 05/07/2020 1208   PLT 285 05/07/2020 1208   MCV 88.5 05/07/2020 1208   MCH 29.4 05/07/2020 1208   MCHC 33.2 05/07/2020 1208   RDW 13.2 05/07/2020 1208   LYMPHSABS 2.5 05/07/2020 1208   MONOABS 0.6 05/07/2020 1208   EOSABS 0.2 05/07/2020 1208   BASOSABS 0.1 05/07/2020 1208     Chest Imaging: 12/07/2019: CT chest No significant  parenchymal abnormality nothing obvious for identification and reason for shortness of breath.  She does have a small subcentimeter 4 mm pulmonary nodule.  This requires no additional follow-up as she considered low risk. The patient's images have been independently reviewed by me.    Pulmonary Functions Testing Results: No flowsheet data found.  FeNO:   Pathology:  Echocardiogram:   Heart Catheterization:     Assessment & Plan:     ICD-10-CM   1. SOB (shortness of breath)  R06.02 Pulmonary Function Test  2. DOE (dyspnea on exertion)  R06.00 Pulmonary Function Test  3. BMI 30.0-30.9,adult  Z68.30   4. B12 deficiency  E53.8   5. Pernicious anemia  D51.0   6. Iron deficiency anemia, unspecified iron deficiency anemia type  D50.9   7. Pulmonary nodule  R91.1     Discussion:  This is a 39 year old female with multiple medical complaints but her biggest problem is that she feels short of breath when she exerts herself.  She has a normal chest CT.  She also has a normal CBC.  There is been difficulty keeping her iron levels up and she has been followed closely by oncology/hematology.  She is tired a lot and complains of ongoing fatigue.  I am unsure that this is related to any primary pulmonary problem.  Plan: I reassured her that we could get pulmonary function test to evaluate her lungs. I do not think she needs any additional imaging she had a recent CAT scan in May that was negative. As for her small subcentimeter pulmonary nodule she needs no additional follow-up for this. Patient was counseled on weight loss and increasing her activities of daily living. She lives a relatively sedentary lifestyle and I encouraged her that she normally sees shortness of breath when she exerts herself and that seems to be more of an  endurance problem.  Patient to follow-up in our clinic once PFTs are complete.  She can see me in follow-up or one of our APP's.   Current Outpatient Medications:  .   Cyanocobalamin (B-12 IJ), Inject as directed. 2 x weekly, Disp: , Rfl:  .  ergocalciferol (VITAMIN D2) 1.25 MG (50000 UT) capsule, Take 1 capsule (50,000 Units total) by mouth once a week., Disp: 30 capsule, Rfl: 6 .  ibuprofen (ADVIL) 800 MG tablet, Take 800 mg by mouth 3 (three) times daily., Disp: , Rfl:    Josephine IgoBradley L Sharada Albornoz, DO [facility] 05/16/2020 6:09 PM

## 2020-05-16 NOTE — Patient Instructions (Signed)
Thank you for visiting Dr. Tonia Brooms at [facility]. Today we recommend the following:  Orders Placed This Encounter  Procedures  . Pulmonary Function Test   Return in about 6 weeks (around 06/27/2020), or following PFTs, for with APP.    Please do your part to reduce the spread of COVID-19.

## 2020-05-22 ENCOUNTER — Ambulatory Visit (INDEPENDENT_AMBULATORY_CARE_PROVIDER_SITE_OTHER): Payer: Medicaid Other | Admitting: Internal Medicine

## 2020-05-22 ENCOUNTER — Encounter: Payer: Self-pay | Admitting: Internal Medicine

## 2020-05-22 ENCOUNTER — Other Ambulatory Visit: Payer: Self-pay

## 2020-05-22 VITALS — BP 104/80 | HR 78 | Ht 62.0 in | Wt 170.0 lb

## 2020-05-22 DIAGNOSIS — D510 Vitamin B12 deficiency anemia due to intrinsic factor deficiency: Secondary | ICD-10-CM | POA: Diagnosis not present

## 2020-05-22 DIAGNOSIS — R0602 Shortness of breath: Secondary | ICD-10-CM

## 2020-05-22 NOTE — Patient Instructions (Addendum)
Medication Instructions:  No Changes In Medications at this time.  *If you need a refill on your cardiac medications before your next appointment, please call your pharmacy*  Lab Work: None Ordered At This Time.  If you have labs (blood work) drawn today and your tests are completely normal, you will receive your results only by: Marland Kitchen MyChart Message (if you have MyChart) OR . A paper copy in the mail If you have any lab test that is abnormal or we need to change your treatment, we will call you to review the results.  Testing/Procedures: Your physician has requested that you have an echocardiogram. Echocardiography is a painless test that uses sound waves to create images of your heart. It provides your doctor with information about the size and shape of your heart and how well your heart's chambers and valves are working. You may receive an ultrasound enhancing agent through an IV if needed to better visualize your heart during the echo.This procedure takes approximately one hour. There are no restrictions for this procedure. This will take place at the [address].   Your physician has requested that you have an exercise tolerance test. For further information please visit [email or web address] Please also follow instruction sheet, as given. This will take place at [address] [facility], [address].  Do not drink or eat foods with caffeine for 24 hours before the test. (Chocolate, coffee, tea, or energy drinks)  If you use an inhaler, bring it with you to the test.  Do not smoke for 4 hours before the test.  Wear comfortable shoes and clothing.  Follow-Up: At [facility], you and your health needs are our priority.  As part of our continuing mission to provide you with exceptional heart care, we have created designated Provider Care Teams.  These Care Teams include your primary Cardiologist (physician) and Advanced Practice Providers (APPs -  Physician Assistants and Nurse  Practitioners) who all work together to provide you with the care you need, when you need it.  Your next appointment:   4-6 week(s)  The format for your next appointment:   In Person or virtual   Provider:   Weston Brass, MD

## 2020-05-22 NOTE — Progress Notes (Signed)
Cardiology Office Note:    Date:  05/22/2020   ID:  Meredith [name], DOB 1981/02/04, MRN [id number]  PCP:  Hillery Aldo, PA-C  Cardiologist:  No primary care provider on file.  Electrophysiologist:  None   Referring MD: Serena Croissant, MD   Chief Complaint/Reason for Referral: SOB  History of Present Illness:    Meredith [name] is a 39 y.o. female with a history of iron deficiency anemia and pernicious anemia who presents for evaluation of SOB. She describes 1 mo of SOB, where she feels she cannot take a deep breath. Has been screened for OSA and is negative. Following with pulmonary medicine as well. She is iron deficient and has been transfusion dependent in past. Describes DOE, she does not formally exercise but does have 3 children who she stays busy with. She has a significant amount of daily life stressors. She has an 38 yo, 38 yo and 38 yo (boy, boy, girl respectively).   Pulmonary function tests are planned. CT chest was reviewed by Dr. Tonia Brooms and felt not to represent intrinsic pulmonary parenchymal disease. Follows closely with heme/onc and potential BMBx planned.   Denies palpitations or syncope. The patient denies chest pain, chest pressure, palpitations, PND, orthopnea, or leg swelling. Denies cough, fever, chills. Denies nausea, vomiting. Denies syncope or presyncope. Denies dizziness or lightheadedness.  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   Fhx: maternal grandfather had heart disease, details of which she is not certain.   Past Medical History:  Diagnosis Date  . Iron deficiency anemia    pernicious anemia  . Pyelonephritis 06/21/2011   10 kidney stones surgically removed  . 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 CESAREAN SECTION  jan 2009  . CESAREAN SECTION WITH BILATERAL TUBAL LIGATION N/A 04/07/2014   Procedure: CESAREAN SECTION WITH  BILATERAL TUBAL LIGATION;  Surgeon: Sharon Seller, DO;  Location: [facility];  Service: Obstetrics;  Laterality: N/A;  . mirena iud N/A 2009  . NEPHROLITHOTOMY  08/03/2011   Procedure: NEPHROLITHOTOMY PERCUTANEOUS;  Surgeon: Kathi Ludwig, MD;  Location: [facility];  Service: Urology;  Laterality: Left;  . NEPHROSTOMY  08/03/11   left  . right wrist surgery  2005   I and D , infection to the bone  . WISDOM TOOTH EXTRACTION  2006    Current Medications: Current Meds  Medication Sig  . Cyanocobalamin (B-12 IJ) Inject as directed. 2 x weekly  . ergocalciferol (VITAMIN D2) 1.25 MG (50000 UT) capsule Take 1 capsule (50,000 Units total) by mouth once a week.  . iron dextran complex in sodium chloride 0.9 % 500 mL Inject into the vein once.  . [DISCONTINUED] ibuprofen (ADVIL) 800 MG tablet Take 800 mg by mouth 3 (three) times daily.     Allergies:   Morphine and related and Promethazine hcl   Social History   Tobacco Use  . Smoking status: Never Smoker  . Smokeless tobacco: Never Used  Vaping Use  . Vaping Use: Never used  Substance Use Topics  . Alcohol use: Yes    Comment: occas  . Drug use: No     Family History: The patient's family history includes Dementia in her maternal grandmother; Diabetes in her maternal grandfather; Heart disease in her maternal grandfather; Hypertension in her maternal grandmother; Thyroid disease in her maternal grandmother; Urolithiasis in her mother.  ROS:  Please see the history of present illness.    All other systems reviewed and are negative.  EKGs/Labs/Other Studies Reviewed:    The following studies were reviewed today:  EKG:  NSR  I have independently reviewed the images from CT chest w/ contrast 12/07/19. No definite CAC.  Recent Labs: 10/27/2019: ALT 11; BUN 11; Creatinine 0.79; Potassium 4.1; Sodium 140 05/07/2020: Hemoglobin 13.1; Platelet Count 285  Recent Lipid Panel    Component Value Date/Time   CHOL 206 (H) 10/27/2012  1343   TRIG 130 10/27/2012 1343   HDL 55 10/27/2012 1343   CHOLHDL 3.7 10/27/2012 1343   VLDL 26 10/27/2012 1343   LDLCALC 125 (H) 10/27/2012 1343    Physical Exam:    VS:  BP 104/80   Pulse 78   Ht 5\' 2"  (1.575 m)   Wt 170 lb (77.1 kg)   LMP 04/29/2020 (Exact Date)   BMI 31.09 kg/m     Wt Readings from Last 5 Encounters:  05/22/20 170 lb (77.1 kg)  05/16/20 169 lb (76.7 kg)  05/07/20 171 lb 14.4 oz (78 kg)  03/21/20 168 lb 4.8 oz (76.3 kg)  01/04/20 169 lb 6.4 oz (76.8 kg)    Constitutional: No acute distress Eyes: sclera non-icteric, normal conjunctiva and lids ENMT: normal dentition, moist mucous membranes Cardiovascular: regular rhythm, normal rate, no murmurs. S1 and S2 normal. Radial pulses normal bilaterally. No jugular venous distention.  Respiratory: clear to auscultation bilaterally GI : normal bowel sounds, soft and nontender. No distention.   MSK: extremities warm, well perfused. No edema.  NEURO: grossly nonfocal exam, moves all extremities. PSYCH: alert and oriented x 3, normal mood and affect.   ASSESSMENT:    1. Pernicious anemia   2. Shortness of breath    PLAN:    Pernicious anemia - Plan: ECHOCARDIOGRAM COMPLETE  Shortness of breath - Plan: EKG 12-Lead, ECHOCARDIOGRAM COMPLETE, EXERCISE TOLERANCE TEST (ETT)   We will start with an ETT and echocardiogram to evaluate SOB. Hemoglobin is normal, but she is noted to have iron deficiency undergoing treatment, with improved ferritin levels from Sept to October. Her DOE is possibly cardiac, therefore we will perform baseline evaluation. No definite CAC on CT chest from May.   Total time of encounter: 45 minutes total time of encounter, including 30 minutes spent in face-to-face patient care on the date of this encounter. This time includes coordination of care and counseling regarding above mentioned problem list. Remainder of non-face-to-face time involved reviewing chart documents/testing relevant to the  patient encounter and documentation in the medical record. I have independently reviewed documentation from referring provider.   June, MD Wilkinson  [facility]    Medication Adjustments/Labs and Tests Ordered: Current medicines are reviewed at length with the patient today.  Concerns regarding medicines are outlined above.   Orders Placed This Encounter  Procedures  . EXERCISE TOLERANCE TEST (ETT)  . EKG 12-Lead  . ECHOCARDIOGRAM COMPLETE    No orders of the defined types were placed in this encounter.   Patient Instructions  Medication Instructions:  No Changes In Medications at this time.  *If you need a refill on your cardiac medications before your next appointment, please call your pharmacy*  Lab Work: None Ordered At This Time.  If you have labs (blood work) drawn today and your tests are completely normal, you will receive your results only by: Weston Brass MyChart Message (if you have MyChart) OR . A paper copy in the mail If  you have any lab test that is abnormal or we need to change your treatment, we will call you to review the results.  Testing/Procedures: Your physician has requested that you have an echocardiogram. Echocardiography is a painless test that uses sound waves to create images of your heart. It provides your doctor with information about the size and shape of your heart and how well your heart's chambers and valves are working. You may receive an ultrasound enhancing agent through an IV if needed to better visualize your heart during the echo.This procedure takes approximately one hour. There are no restrictions for this procedure. This will take place at the [address].   Your physician has requested that you have an exercise tolerance test. For further information please visit [email or web address] Please also follow instruction sheet, as given. This will take place at [address], [address].  Do not drink or eat foods  with caffeine for 24 hours before the test. (Chocolate, coffee, tea, or energy drinks)  If you use an inhaler, bring it with you to the test.  Do not smoke for 4 hours before the test.  Wear comfortable shoes and clothing.  Follow-Up: At [facility], you and your health needs are our priority.  As part of our continuing mission to provide you with exceptional heart care, we have created designated Provider Care Teams.  These Care Teams include your primary Cardiologist (physician) and Advanced Practice Providers (APPs -  Physician Assistants and Nurse Practitioners) who all work together to provide you with the care you need, when you need it.  Your next appointment:   4-6 week(s)  The format for your next appointment:   In Person or virtual   Provider:   Weston Brass, MD

## 2020-05-28 ENCOUNTER — Other Ambulatory Visit (HOSPITAL_COMMUNITY)
Admission: RE | Admit: 2020-05-28 | Discharge: 2020-05-28 | Disposition: A | Discharge status: Home / Self Care | Payer: Medicaid Other | Source: Ambulatory Visit | Attending: Internal Medicine | Admitting: Internal Medicine

## 2020-05-28 DIAGNOSIS — Z01812 Encounter for preprocedural laboratory examination: Secondary | ICD-10-CM | POA: Insufficient documentation

## 2020-05-28 DIAGNOSIS — Z20822 Contact with and (suspected) exposure to covid-19: Secondary | ICD-10-CM | POA: Diagnosis not present

## 2020-05-28 LAB — SARS CORONAVIRUS 2 (TAT 6-24 HRS): SARS Coronavirus 2: NEGATIVE

## 2020-05-29 ENCOUNTER — Encounter (HOSPITAL_COMMUNITY): Payer: Medicaid Other

## 2020-05-31 ENCOUNTER — Ambulatory Visit (HOSPITAL_COMMUNITY)
Admission: RE | Admit: 2020-05-31 | Discharge: 2020-05-31 | Disposition: A | Discharge status: Home / Self Care | Payer: Medicaid Other | Source: Ambulatory Visit | Attending: Cardiology | Admitting: Cardiology

## 2020-05-31 DIAGNOSIS — R0602 Shortness of breath: Secondary | ICD-10-CM

## 2020-05-31 LAB — EXERCISE TOLERANCE TEST
Estimated workload: 12.1 METS
Exercise duration (min): 10 min
Exercise duration (sec): 15 s
MPHR: 182 {beats}/min
Peak HR: 184 {beats}/min
Percent HR: 101 %
Rest HR: 88 {beats}/min

## 2020-06-24 ENCOUNTER — Other Ambulatory Visit (HOSPITAL_COMMUNITY): Payer: Medicaid Other

## 2020-06-24 ENCOUNTER — Other Ambulatory Visit: Payer: Self-pay | Admitting: *Deleted

## 2020-06-24 DIAGNOSIS — D509 Iron deficiency anemia, unspecified: Secondary | ICD-10-CM

## 2020-06-26 ENCOUNTER — Other Ambulatory Visit: Payer: Medicaid Other

## 2020-06-26 ENCOUNTER — Telehealth: Payer: Self-pay

## 2020-06-26 NOTE — Telephone Encounter (Signed)
Pt called to update that she has had + Covid exposure and needs to reschedule upcoming appointment.   Pt apts rescheduled.   Pt inquiring that she wants to proceed with a bone marrow bx.   RN set up virtual appointment per MD recommendations to review.

## 2020-06-27 ENCOUNTER — Ambulatory Visit: Payer: Medicaid Other | Admitting: Pulmonary Disease

## 2020-06-27 ENCOUNTER — Telehealth: Payer: Medicaid Other | Admitting: Internal Medicine

## 2020-06-27 NOTE — Assessment & Plan Note (Signed)
Lab review 03/19/2020: Iron saturation 12%, ferritin 8, B12 316, rheumatoid factor negative, vitamin D 31, hemoglobin 13.2, MCV 89.3  Iron deficiency without anemia:  Previous IV iron: September 2021 Labs will be repeated today.  Shortness of breath and chest pressure: I will send her to pulmonary and cardiology.  She tells me that her primary care physician is leaving the practice and she has not found a different primary care physician yet.  Rheumatoid arthritis testing was negative. Patient wants to see rheumatology at [facility].    I discussed with her that I am unsure about where to go from here. I am happy to refer her to [facility] or [facility] if she wishes to be evaluated there for this profound fatigue.  I discussed with her that we can do a bone marrow biopsy but her CBC is perfectly normal and therefore I am not sure that it is going to be of any benefit to her.

## 2020-06-27 NOTE — Progress Notes (Signed)
  HEMATOLOGY-ONCOLOGY [facility] VIDEO VISIT PROGRESS NOTE  I connected with Meredith [name] on 06/28/2020 at 11:30 AM EST by MyChart video conference and verified that I am speaking with the correct person using two identifiers.  I discussed the limitations, risks, security and privacy concerns of performing an evaluation and management service by MyChart and the availability of in person appointments.  I also discussed with the patient that there may be a patient responsible charge related to this service. The patient expressed understanding and agreed to proceed.  Patient's Location: Home Physician Location: Clinic  CHIEF COMPLIANT: Follow-up of pernicious anemia and B-12 deficiency  INTERVAL HISTORY: Meredith [name] is a 39 y.o. female with above-mentioned history of pernicious anemia who is receiving a weekly B12 injections.She presents over MyChart to discuss undergoing a bone marrow biopsy.  Her major complaints are severe fatigue and generalized weakness that has not improved no matter what she has been doing.  Observations/Objective:  There were no vitals filed for this visit. There is no height or weight on file to calculate BMI.  I have reviewed the data as listed CMP Latest Ref Rng & Units 10/27/2019 07/15/2014 10/27/2012  Glucose 70 - 99 mg/dL 97 108(H) 89  BUN 6 - 20 mg/dL _0 Creatinine 0.44 - 1.00 mg/dL 0.79 0.75 0.79  Sodium 135 - 145 mmol/L 140 135 141  Potassium 3.5 - 5.1 mmol/L 4.1 3.6 4.7  Chloride 98 - 111 mmol/L 105 105 105  CO2 22 - 32 mmol/L _1 Calcium 8.9 - 10.3 mg/dL 9.1 9.1 9.7  Total Protein 6.5 - 8.1 g/dL 7.3 8.1 7.2  Total Bilirubin 0.3 - 1.2 mg/dL 0.5 0.8 0.5  Alkaline Phos 38 - 126 U/L 69 76 73  AST 15 - 41 U/L 13(L) 21 20  ALT 0 - 44 U/L _2 Lab Results  Component Value Date   WBC 8.2 05/07/2020   HGB 13.1 05/07/2020   HCT 39.4 05/07/2020   MCV 88.5 05/07/2020   PLT 285 05/07/2020   NEUTROABS 4.8 05/07/2020       Assessment Plan:  Iron deficiency anemia Lab review 03/19/2020: Iron saturation 12%, ferritin 8, B12 316, rheumatoid factor negative, vitamin D 31, hemoglobin 13.2, MCV 89.3  Iron deficiency without anemia:  Previous IV iron: September 2021 Labs will be repeated today.  Shortness of breath and chest pressure: I will send her to pulmonary and cardiology.  She tells me that her primary care physician is leaving the practice and she has not found a different primary care physician yet.  Based on continued profound fatigue and lack of clear-cut etiology, we debated pros and cons of a bone marrow biopsy and we decided to proceed with the plan.   I discussed the assessment and treatment plan with the patient. The patient was provided an opportunity to ask questions and all were answered. The patient agreed with the plan and demonstrated an understanding of the instructions. The patient was advised to call back or seek an in-person evaluation if the symptoms worsen or if the condition fails to improve as anticipated.   I provided 20 minutes of face-to-face MyChart video visit time during this encounter.    Rulon Eisenmenger, MD 06/28/2020   I, Molly Dorshimer, am acting as scribe for Nicholas Lose, MD.  I have reviewed the above documentation for accuracy and completeness, and I agree with the above.

## 2020-06-28 ENCOUNTER — Other Ambulatory Visit: Payer: Self-pay

## 2020-06-28 ENCOUNTER — Telehealth (HOSPITAL_BASED_OUTPATIENT_CLINIC_OR_DEPARTMENT_OTHER): Payer: Medicaid Other | Admitting: Hematology and Oncology

## 2020-06-28 ENCOUNTER — Ambulatory Visit: Payer: Medicaid Other | Admitting: Hematology and Oncology

## 2020-06-28 DIAGNOSIS — D509 Iron deficiency anemia, unspecified: Secondary | ICD-10-CM

## 2020-06-28 NOTE — Progress Notes (Signed)
Pt scheduled for bone marrow bx on 12/22 - [facility] notified - Pt aware. Dr. Pamelia Hoit to perform.

## 2020-07-03 ENCOUNTER — Other Ambulatory Visit: Payer: Self-pay

## 2020-07-03 ENCOUNTER — Other Ambulatory Visit: Payer: Self-pay | Admitting: *Deleted

## 2020-07-03 ENCOUNTER — Inpatient Hospital Stay: Payer: Medicaid Other | Attending: Hematology and Oncology

## 2020-07-03 ENCOUNTER — Inpatient Hospital Stay (HOSPITAL_BASED_OUTPATIENT_CLINIC_OR_DEPARTMENT_OTHER): Payer: Medicaid Other | Admitting: Hematology and Oncology

## 2020-07-03 VITALS — BP 106/64 | HR 77 | Temp 98.3°F | Resp 20

## 2020-07-03 DIAGNOSIS — R5383 Other fatigue: Secondary | ICD-10-CM | POA: Diagnosis not present

## 2020-07-03 DIAGNOSIS — E538 Deficiency of other specified B group vitamins: Secondary | ICD-10-CM

## 2020-07-03 DIAGNOSIS — E559 Vitamin D deficiency, unspecified: Secondary | ICD-10-CM | POA: Insufficient documentation

## 2020-07-03 DIAGNOSIS — D510 Vitamin B12 deficiency anemia due to intrinsic factor deficiency: Secondary | ICD-10-CM | POA: Diagnosis not present

## 2020-07-03 DIAGNOSIS — D509 Iron deficiency anemia, unspecified: Secondary | ICD-10-CM

## 2020-07-03 LAB — IRON AND TIBC
Iron: 72 ug/dL (ref 41–142)
Saturation Ratios: 29 % (ref 21–57)
TIBC: 251 ug/dL (ref 236–444)
UIBC: 179 ug/dL (ref 120–384)

## 2020-07-03 LAB — CBC WITH DIFFERENTIAL (CANCER CENTER ONLY)
Abs Immature Granulocytes: 0.01 10*3/uL (ref 0.00–0.07)
Basophils Absolute: 0.1 10*3/uL (ref 0.0–0.1)
Basophils Relative: 1 %
Eosinophils Absolute: 0.2 10*3/uL (ref 0.0–0.5)
Eosinophils Relative: 2 %
HCT: 41 % (ref 36.0–46.0)
Hemoglobin: 14 g/dL (ref 12.0–15.0)
Immature Granulocytes: 0 %
Lymphocytes Relative: 25 %
Lymphs Abs: 1.9 10*3/uL (ref 0.7–4.0)
MCH: 30 pg (ref 26.0–34.0)
MCHC: 34.1 g/dL (ref 30.0–36.0)
MCV: 87.8 fL (ref 80.0–100.0)
Monocytes Absolute: 0.6 10*3/uL (ref 0.1–1.0)
Monocytes Relative: 8 %
Neutro Abs: 4.7 10*3/uL (ref 1.7–7.7)
Neutrophils Relative %: 64 %
Platelet Count: 258 10*3/uL (ref 150–400)
RBC: 4.67 MIL/uL (ref 3.87–5.11)
RDW: 12.5 % (ref 11.5–15.5)
WBC Count: 7.4 10*3/uL (ref 4.0–10.5)
nRBC: 0 % (ref 0.0–0.2)

## 2020-07-03 LAB — CMP (CANCER CENTER ONLY)
ALT: 17 U/L (ref 0–44)
AST: 15 U/L (ref 15–41)
Albumin: 4 g/dL (ref 3.5–5.0)
Alkaline Phosphatase: 69 U/L (ref 38–126)
Anion gap: 8 (ref 5–15)
BUN: 18 mg/dL (ref 6–20)
CO2: 25 mmol/L (ref 22–32)
Calcium: 9.2 mg/dL (ref 8.9–10.3)
Chloride: 108 mmol/L (ref 98–111)
Creatinine: 0.82 mg/dL (ref 0.44–1.00)
GFR, Estimated: >60 mL/min (ref >60)
Glucose, Bld: 104 mg/dL — ABNORMAL HIGH (ref 70–99)
Potassium: 4 mmol/L (ref 3.5–5.1)
Sodium: 141 mmol/L (ref 135–145)
Total Bilirubin: 0.4 mg/dL (ref 0.3–1.2)
Total Protein: 7 g/dL (ref 6.5–8.1)

## 2020-07-03 LAB — VITAMIN B12: Vitamin B-12: 309 pg/mL (ref 180–914)

## 2020-07-03 LAB — FERRITIN: Ferritin: 91 ng/mL (ref 11–307)

## 2020-07-03 LAB — VITAMIN D 25 HYDROXY (VIT D DEFICIENCY, FRACTURES): Vit D, 25-Hydroxy: 33.49 ng/mL (ref 30–100)

## 2020-07-03 NOTE — Patient Instructions (Signed)
Bone Marrow Aspiration and Bone Marrow Biopsy, Adult, Care After This sheet gives you information about how to care for yourself after your procedure. Your health care provider may also give you more specific instructions. If you have problems or questions, contact your health care provider. What can I expect after the procedure? After the procedure, it is common to have:  Mild pain and tenderness.  Swelling.  Bruising. Follow these instructions at home: Puncture site care   Follow instructions from your health care provider about how to take care of the puncture site. Make sure you: ? Wash your hands with soap and water before and after you change your bandage (dressing). If soap and water are not available, use hand sanitizer. ? Change your dressing as told by your health care provider.  Check your puncture site every day for signs of infection. Check for: ? More redness, swelling, or pain. ? Fluid or blood. ? Warmth. ? Pus or a bad smell. Activity  Return to your normal activities as told by your health care provider. Ask your health care provider what activities are safe for you.  Do not lift anything that is heavier than 10 lb (4.5 kg), or the limit that you are told, until your health care provider says that it is safe.  Do not drive for 24 hours if you were given a sedative during your procedure. General instructions   Take over-the-counter and prescription medicines only as told by your health care provider.  Do not take baths, swim, or use a hot tub until your health care provider approves. Ask your health care provider if you may take showers. You may only be allowed to take sponge baths.  If directed, put ice on the affected area. To do this: ? Put ice in a plastic bag. ? Place a towel between your skin and the bag. ? Leave the ice on for 20 minutes, 2-3 times a day.  Keep all follow-up visits as told by your health care provider. This is important. Contact a  health care provider if:  Your pain is not controlled with medicine.  You have a fever.  You have more redness, swelling, or pain around the puncture site.  You have fluid or blood coming from the puncture site.  Your puncture site feels warm to the touch.  You have pus or a bad smell coming from the puncture site. Summary  After the procedure, it is common to have mild pain, tenderness, swelling, and bruising.  Follow instructions from your health care provider about how to take care of the puncture site and what activities are safe for you.  Take over-the-counter and prescription medicines only as told by your health care provider.  Contact a health care provider if you have any signs of infection, such as fluid or blood coming from the puncture site. This information is not intended to replace advice given to you by your health care provider. Make sure you discuss any questions you have with your health care provider. Document Revised: 11/15/2018 Document Reviewed: 11/15/2018 Elsevier Patient Education  2020 Elsevier Inc.  

## 2020-07-03 NOTE — Progress Notes (Signed)
Pt. completed 30 minute post observation. Bone Marrow dressing clean, dry, and intact. Discharge instructions reviewed and given to pt. Pt. stable for discharge, left via ambulation, no respiratory distress noted.

## 2020-07-03 NOTE — Progress Notes (Signed)
INDICATION: Severe fatigue unclear etiology    Bone Marrow Biopsy and Aspiration Procedure Note   Informed consent was obtained and potential risks including bleeding, infection and pain were reviewed with the patient.  The patient's name, date of birth, identification, consent and allergies were verified prior to the start of procedure and time out was performed.  The left posterior iliac crest was chosen as the site of biopsy.  The skin was prepped with ChloraPrep.   8 cc of 1% lidocaine was used to provide local anaesthesia.   10 cc of bone marrow aspirate was obtained followed by 1cm biopsy.  Pressure was applied to the biopsy site and bandage was placed over the biopsy site. Patient was made to lie on the back for 15 mins prior to discharge.  The procedure was tolerated well. COMPLICATIONS: None BLOOD LOSS: none The patient was discharged home in stable condition with a 1 week follow up to review results.  Patient was provided with post bone marrow biopsy instructions and instructed to call if there was any bleeding or worsening pain.  Specimens sent for flow cytometry, cytogenetics and additional studies.  Signed Harriette Ohara, MD

## 2020-07-04 ENCOUNTER — Inpatient Hospital Stay (HOSPITAL_COMMUNITY): Admission: RE | Admit: 2020-07-04 | Payer: Medicaid Other | Source: Ambulatory Visit

## 2020-07-08 ENCOUNTER — Ambulatory Visit: Payer: Medicaid Other | Admitting: Pulmonary Disease

## 2020-07-08 ENCOUNTER — Telehealth: Payer: Self-pay | Admitting: Hematology and Oncology

## 2020-07-08 NOTE — Telephone Encounter (Signed)
No 12/22 los, no changes made to pt schedule  

## 2020-07-15 NOTE — Progress Notes (Signed)
  HEMATOLOGY-ONCOLOGY [facility] VIDEO VISIT PROGRESS NOTE  I connected with Meredith [name] on 07/16/2020 at  9:15 AM EST by MyChart video conference and verified that I am speaking with the correct person using two identifiers.  I discussed the limitations, risks, security and privacy concerns of performing an evaluation and management service by MyChart and the availability of in person appointments.  I also discussed with the patient that there may be a patient responsible charge related to this service. The patient expressed understanding and agreed to proceed.  Patient's Location: Home Physician Location: Clinic  CHIEF COMPLIANT: Follow-up to discuss bone marrow biopsy results   INTERVAL HISTORY: Meredith [name] is a 40 y.o. female with above-mentioned history of pernicious anemia who is receiving a weekly B12 injections.She underwent a bone marrow biopsy on 07/03/20 and presents over MyChart to discuss her results.   Observations/Objective:  There were no vitals filed for this visit. There is no height or weight on file to calculate BMI.  I have reviewed the data as listed CMP Latest Ref Rng & Units 07/03/2020 10/27/2019 07/15/2014  Glucose 70 - 99 mg/dL 104(H) 97 108(H)  BUN 6 - 20 mg/dL _0 Creatinine 0.44 - 1.00 mg/dL 0.82 0.79 0.75  Sodium 135 - 145 mmol/L 141 140 135  Potassium 3.5 - 5.1 mmol/L 4.0 4.1 3.6  Chloride 98 - 111 mmol/L 108 105 105  CO2 22 - 32 mmol/L _1 Calcium 8.9 - 10.3 mg/dL 9.2 9.1 9.1  Total Protein 6.5 - 8.1 g/dL 7.0 7.3 8.1  Total Bilirubin 0.3 - 1.2 mg/dL 0.4 0.5 0.8  Alkaline Phos 38 - 126 U/L 69 69 76  AST 15 - 41 U/L 15 13(L) 21  ALT 0 - 44 U/L _2 Lab Results  Component Value Date   WBC 7.4 07/03/2020   HGB 14.0 07/03/2020   HCT 41.0 07/03/2020   MCV 87.8 07/03/2020   PLT 258 07/03/2020   NEUTROABS 4.7 07/03/2020      Assessment Plan:  B12 deficiency Bone marrow biopsy 07/03/2020: 50 to 60% cellularity,  normocellular bone marrow with trilineage hematopoiesis.  No dyspoietic or dysplastic changes noted. Cytogenetics are pending  I discussed with the patient of the bone marrow biopsy does not give [place] any clear etiology for her profound and severe fatigue. I discussed with her that she needs to follow with the primary care physician to continue the work-up for the severe fatigue. She has filed for disability.  Our plan is to see her back in 4 months with labs and follow-up.    I discussed the assessment and treatment plan with the patient. The patient was provided an opportunity to ask questions and all were answered. The patient agreed with the plan and demonstrated an understanding of the instructions. The patient was advised to call back or seek an in-person evaluation if the symptoms worsen or if the condition fails to improve as anticipated.   I provided 20 minutes of face-to-face MyChart video visit time during this encounter.    Rulon Eisenmenger, MD 07/16/2020   I, Molly Dorshimer, am acting as scribe for Nicholas Lose, MD.  I have reviewed the above documentation for accuracy and completeness, and I agree with the above.

## 2020-07-16 ENCOUNTER — Other Ambulatory Visit (HOSPITAL_COMMUNITY): Payer: Medicaid Other

## 2020-07-16 ENCOUNTER — Telehealth (HOSPITAL_BASED_OUTPATIENT_CLINIC_OR_DEPARTMENT_OTHER): Payer: Medicaid Other | Admitting: Hematology and Oncology

## 2020-07-16 DIAGNOSIS — E538 Deficiency of other specified B group vitamins: Secondary | ICD-10-CM

## 2020-07-16 NOTE — Assessment & Plan Note (Signed)
Bone marrow biopsy 07/03/2020: 50 to 60% cellularity, normocellular bone marrow with trilineage hematopoiesis.  No dyspoietic or dysplastic changes noted. Cytogenetics are pending  I discussed with the patient of the bone marrow biopsy does not give [place] any clear etiology for her profound and severe fatigue. I discussed with her that she needs to follow with the primary care physician to continue the work-up for the severe fatigue. She has filed for disability.  Our plan is to see her back in 4 months with labs and follow-up.

## 2020-07-17 ENCOUNTER — Encounter (HOSPITAL_COMMUNITY): Payer: Self-pay | Admitting: Hematology and Oncology

## 2020-07-17 ENCOUNTER — Telehealth: Payer: Self-pay | Admitting: *Deleted

## 2020-07-17 ENCOUNTER — Other Ambulatory Visit: Payer: Medicaid Other

## 2020-07-17 LAB — SURGICAL PATHOLOGY

## 2020-07-17 NOTE — Telephone Encounter (Signed)
Received call from pt with complaint of constant right leg pain x1 week.  Pt denies any recent injury or trauma.  Pt states her PCP has left the practice and is currently trying to get established with new PCP.  Per MD pt needing to preform daily yoga exercises and stretching and take OTC tylenol as directed on the back of the bottle.  Pt also needing to get established with new PCP or go to Urgent Care for further evaluation if pain persists and is unable to be seen by PCP.  Pt verbalized understanding.

## 2020-07-18 NOTE — Progress Notes (Incomplete)
   Patient Care Team: Jenny Reichmann, PA-C (Inactive) as PCP - General (Physician Assistant)  DIAGNOSIS: No diagnosis found.  CHIEF COMPLIANT: Follow-up of pernicious anemia and B-12 deficiency  INTERVAL HISTORY: Meredith [name] is a 40 y.o. with above-mentioned history of anemia who is receiving a weekly B12 injections.She underwent a bone marrow biopsy on 07/03/20 and presents to the clinic today to discuss the results.  ALLERGIES:  is allergic to morphine and related and promethazine hcl.  MEDICATIONS:  Current Outpatient Medications  Medication Sig Dispense Refill  . ergocalciferol (VITAMIN D2) 1.25 MG (50000 UT) capsule Take 1 capsule (50,000 Units total) by mouth once a week. 30 capsule 6   No current facility-administered medications for this visit.    PHYSICAL EXAMINATION: ECOG PERFORMANCE STATUS: {CHL ONC ECOG [phone]}  There were no vitals filed for this visit. There were no vitals filed for this visit.  LABORATORY DATA:  I have reviewed the data as listed CMP Latest Ref Rng & Units 07/03/2020 10/27/2019 07/15/2014  Glucose 70 - 99 mg/dL 104(H) 97 108(H)  BUN 6 - 20 mg/dL $Remove'18 11 17  'yvNInHb$ Creatinine 0.44 - 1.00 mg/dL 0.82 0.79 0.75  Sodium 135 - 145 mmol/L 141 140 135  Potassium 3.5 - 5.1 mmol/L 4.0 4.1 3.6  Chloride 98 - 111 mmol/L 108 105 105  CO2 22 - 32 mmol/L $RemoveB'25 27 25  'rVmEDtwa$ Calcium 8.9 - 10.3 mg/dL 9.2 9.1 9.1  Total Protein 6.5 - 8.1 g/dL 7.0 7.3 8.1  Total Bilirubin 0.3 - 1.2 mg/dL 0.4 0.5 0.8  Alkaline Phos 38 - 126 U/L 69 69 76  AST 15 - 41 U/L 15 13(L) 21  ALT 0 - 44 U/L $Remo'17 11 24    'BTtRx$ Lab Results  Component Value Date   WBC 7.4 07/03/2020   HGB 14.0 07/03/2020   HCT 41.0 07/03/2020   MCV 87.8 07/03/2020   PLT 258 07/03/2020   NEUTROABS 4.7 07/03/2020    ASSESSMENT & PLAN:  No problem-specific Assessment & Plan notes found for this encounter.    No orders of the defined types were placed in this encounter.  The patient has a good  understanding of the overall plan. she agrees with it. she will call with any problems that may develop before the next visit here.  Total time spent: *** mins including face to face time and time spent for planning, charting and coordination of care  Nicholas Lose, MD 07/18/2020  I, Cloyde Reams Dorshimer, am acting as scribe for Dr. Nicholas Lose.  {insert scribe attestation}

## 2020-07-19 ENCOUNTER — Telehealth: Payer: Self-pay | Admitting: Hematology and Oncology

## 2020-07-19 ENCOUNTER — Ambulatory Visit: Payer: Medicaid Other | Admitting: Hematology and Oncology

## 2020-07-19 NOTE — Telephone Encounter (Signed)
R./s appt per 1/7 sch msg - unable to reach pt. Left message for patient with appt date an time

## 2020-07-22 ENCOUNTER — Other Ambulatory Visit (HOSPITAL_COMMUNITY): Payer: Self-pay | Admitting: Sports Medicine

## 2020-07-22 ENCOUNTER — Other Ambulatory Visit: Payer: Self-pay | Admitting: Sports Medicine

## 2020-07-22 DIAGNOSIS — M79604 Pain in right leg: Secondary | ICD-10-CM

## 2020-07-25 ENCOUNTER — Other Ambulatory Visit: Payer: Self-pay

## 2020-07-25 ENCOUNTER — Ambulatory Visit (HOSPITAL_COMMUNITY): Payer: Medicaid Other | Attending: Cardiology

## 2020-07-25 ENCOUNTER — Telehealth: Payer: Self-pay | Admitting: Internal Medicine

## 2020-07-25 DIAGNOSIS — R0602 Shortness of breath: Secondary | ICD-10-CM | POA: Insufficient documentation

## 2020-07-25 DIAGNOSIS — D510 Vitamin B12 deficiency anemia due to intrinsic factor deficiency: Secondary | ICD-10-CM | POA: Insufficient documentation

## 2020-07-25 LAB — ECHOCARDIOGRAM COMPLETE
Area-P 1/2: 4.68 cm2
S' Lateral: 3.7 cm

## 2020-07-25 NOTE — Telephone Encounter (Signed)
Called pt concerning her recent echo results that she was able to see on Mychart.  Pt states that she is very concerned by the results saying low normal function.  Pt states that when she googles low normal function it comes up to her as heart failure.  Pt also wants to know if this low normal could be the cause for her low blood pressure and fatigue.  Explained to pt that an EF of 50-55% is still in the normal range. Explained to pt that this result does not necessary explain her low B/P and fatigue. Pt verbalizes understanding.

## 2020-07-25 NOTE — Telephone Encounter (Signed)
Will send patient Mychart message explaining echo results.

## 2020-07-25 NOTE — Telephone Encounter (Signed)
° °  Pt said she saw her echo result on her mychart saying "heart functioning low" and she wanted to speak with a nurse to explain it to her. She said if someone can call her today because she is really scared what it means.

## 2020-07-26 NOTE — Telephone Encounter (Signed)
Please see MyChart Encounter Message from Dr. Jacques Navy 07/25/20.

## 2020-07-30 ENCOUNTER — Telehealth: Payer: Self-pay | Admitting: Internal Medicine

## 2020-07-30 NOTE — Telephone Encounter (Signed)
Changed patient's 07/31/2020 visit to a MyChart video visit - phone call OK too. Her neighborhood has not been scraped so she is not sure she can get out. Reviewed consent with patient. She does not have means to check BP, pulse, weight

## 2020-07-30 NOTE — Telephone Encounter (Signed)
Patient Consent for Virtual Visit    [name], you are scheduled for a virtual visit with your provider today.  Just as we do with appointments in the office, we must obtain your consent to participate.  Your consent will be active for this visit and any virtual visit you may have with one of our providers in the next 365 days.  If you have a MyChart account, I can also send a copy of this consent to you electronically.  All virtual visits are billed to your insurance company just like a traditional visit in the office.  As this is a virtual visit, video technology does not allow for your provider to perform a traditional examination.  This may limit your provider's ability to fully assess your condition.  If your provider identifies any concerns that need to be evaluated in person or the need to arrange testing such as labs, EKG, etc, we will make arrangements to do so.  Although advances in technology are sophisticated, we cannot ensure that it will always work on either your end or our end.  If the connection with a video visit is poor, we may have to switch to a telephone visit.  With either a video or telephone visit, we are not always able to ensure that we have a secure connection.   I need to obtain your verbal consent now.   Are you willing to proceed with your visit today?        [id number]}     Meredith [name] has provided verbal consent on 07/30/2020 for a virtual visit (video or telephone).   CONSENT FOR VIRTUAL VISIT FOR:  Meredith [name]  By participating in this virtual visit I agree to the following:  I hereby voluntarily request, consent and authorize [facility] and its employed or contracted physicians, physician assistants, nurse practitioners or other licensed health care professionals (the Practitioner), to provide me with telemedicine health care services (the "Services") as deemed necessary by the treating Practitioner. I acknowledge and consent to  receive the Services by the Practitioner via telemedicine. I understand that the telemedicine visit will involve communicating with the Practitioner through live audiovisual communication technology and the disclosure of certain medical information by electronic transmission. I acknowledge that I have been given the opportunity to request an in-person assessment or other available alternative prior to the telemedicine visit and am voluntarily participating in the telemedicine visit.  I understand that I have the right to withhold or withdraw my consent to the use of telemedicine in the course of my care at any time, without affecting my right to future care or treatment, and that the Practitioner or I may terminate the telemedicine visit at any time. I understand that I have the right to inspect all information obtained and/or recorded in the course of the telemedicine visit and may receive copies of available information for a reasonable fee.  I understand that some of the potential risks of receiving the Services via telemedicine include:  Marland Kitchen Delay or interruption in medical evaluation due to technological equipment failure or disruption; . Information transmitted may not be sufficient (e.g. poor resolution of images) to allow for appropriate medical decision making by the Practitioner; and/or  . In rare instances, security protocols could fail, causing a breach of personal health information.  Furthermore, I acknowledge that it is my responsibility to provide information about my medical history, conditions and care that is complete and accurate to the best of  my ability. I acknowledge that Practitioner's advice, recommendations, and/or decision may be based on factors not within their control, such as incomplete or inaccurate data provided by me or distortions of diagnostic images or specimens that may result from electronic transmissions. I understand that the practice of medicine is not an exact science  and that Practitioner makes no warranties or guarantees regarding treatment outcomes. I acknowledge that a copy of this consent can be made available to me via my patient portal [facility] MyChart), or I can request a printed copy by calling the office of [facility].    I understand that my insurance will be billed for this visit.   I have read or had this consent read to me. . I understand the contents of this consent, which adequately explains the benefits and risks of the Services being provided via telemedicine.  . I have been provided ample opportunity to ask questions regarding this consent and the Services and have had my questions answered to my satisfaction. . I give my informed consent for the services to be provided through the use of telemedicine in my medical care

## 2020-07-30 NOTE — Telephone Encounter (Signed)
Patient wants to know if her appt with Dr. Jacques Navy can be virtual. States her neighborhood has not be scraped yet.

## 2020-07-30 NOTE — Telephone Encounter (Signed)
MD requested that patient be scheduled on a virtual clinic if patient prefers this type of visit. Moved to Friday 1/21 @ 9:20am  When speaking with patient, her mother states they do have a BP cuff at home. Patient states she had been having low BPs. Advised she monitor BP at least twice daily between now and Friday to review with MD

## 2020-07-31 ENCOUNTER — Telehealth: Payer: Medicaid Other | Admitting: Internal Medicine

## 2020-08-02 ENCOUNTER — Telehealth: Payer: Medicaid Other | Admitting: Internal Medicine

## 2020-08-02 ENCOUNTER — Encounter (HOSPITAL_COMMUNITY)
Admission: RE | Admit: 2020-08-02 | Discharge: 2020-08-02 | Disposition: A | Discharge status: Home / Self Care | Payer: Medicaid Other | Source: Ambulatory Visit | Attending: Sports Medicine | Admitting: Sports Medicine

## 2020-08-02 ENCOUNTER — Other Ambulatory Visit: Payer: Self-pay

## 2020-08-02 ENCOUNTER — Other Ambulatory Visit (HOSPITAL_COMMUNITY): Payer: Self-pay | Admitting: Sports Medicine

## 2020-08-02 DIAGNOSIS — M79604 Pain in right leg: Secondary | ICD-10-CM

## 2020-08-02 MED ORDER — TECHNETIUM TC 99M MEDRONATE IV KIT
20.0000 | PACK | Freq: Once | INTRAVENOUS | Status: AC | PRN
  Administered 2020-08-02: 21.1 via INTRAVENOUS

## 2020-08-16 ENCOUNTER — Other Ambulatory Visit: Payer: Self-pay

## 2020-08-16 ENCOUNTER — Ambulatory Visit (INDEPENDENT_AMBULATORY_CARE_PROVIDER_SITE_OTHER): Payer: Medicaid Other | Admitting: Internal Medicine

## 2020-08-16 VITALS — BP 104/76 | HR 86 | Ht 62.0 in | Wt 162.0 lb

## 2020-08-16 DIAGNOSIS — D509 Iron deficiency anemia, unspecified: Secondary | ICD-10-CM

## 2020-08-16 DIAGNOSIS — D510 Vitamin B12 deficiency anemia due to intrinsic factor deficiency: Secondary | ICD-10-CM | POA: Diagnosis not present

## 2020-08-16 DIAGNOSIS — R0602 Shortness of breath: Secondary | ICD-10-CM

## 2020-08-16 NOTE — Progress Notes (Signed)
Cardiology Office Note:    Date:  08/16/2020   ID:  Meredith [name], DOB 05-Apr-1981, MRN [id number]  PCP:  Hillery Aldo, PA-C (Inactive)  Cardiologist:  No primary care provider on file.  Electrophysiologist:  None   Referring MD: No ref. provider found   Chief Complaint/Reason for Referral: SOB  History of Present Illness:    Meredith [name] is a 40 y.o. female with a history of iron deficiency anemia and pernicious anemia who presents for evaluation of SOB. She describes 1 mo of SOB, where she feels she cannot take a deep breath. Has been screened for OSA and is negative. Following with pulmonary medicine as well. She is iron deficient and has been transfusion dependent in past. Describes DOE, she does not formally exercise but does have 3 children who she stays busy with. She has a significant amount of daily life stressors. She has an 39 yo, 39 yo and 39 yo (boy, boy, girl respectively).   Hematology evaluation "does not give [place] any clear etiology for her profound and severe fatigue." Recommended PCP follow up.   Pulm eval shows normal chest CT. PFTs recommended. I cannot see that these have been completed and I do not see that patient followed up with Pulm.   We reviewed her normal cardiac testing in detail. She is concerned about low normal EF, we reviewed that 3D EF and strain are both very normal. ETT normal with excellent exercise time, this is very reassuring.   She remains dejected over her symptoms and lack of unifying diagnosis. She otherwise has no chest pain or red flag symptoms.  Past Medical History:  Diagnosis Date  . Iron deficiency anemia    pernicious anemia  . Pyelonephritis 06/21/2011   10 kidney stones surgically removed  . 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 CESAREAN SECTION  jan 2009  . CESAREAN SECTION WITH BILATERAL TUBAL LIGATION N/A 04/07/2014   Procedure: CESAREAN  SECTION WITH BILATERAL TUBAL LIGATION;  Surgeon: Sharon Seller, DO;  Location: [facility];  Service: Obstetrics;  Laterality: N/A;  . mirena iud N/A 2009  . NEPHROLITHOTOMY  08/03/2011   Procedure: NEPHROLITHOTOMY PERCUTANEOUS;  Surgeon: Kathi Ludwig, MD;  Location: [facility];  Service: Urology;  Laterality: Left;  . NEPHROSTOMY  08/03/11   left  . right wrist surgery  2005   I and D , infection to the bone  . WISDOM TOOTH EXTRACTION  2006    Current Medications: Current Meds  Medication Sig  . ferrous sulfate 220 (44 Fe) MG/5ML solution Take 220 mg by mouth daily. Injection administered biweekly/weekly depending on levels.     Allergies:   Morphine and related and Promethazine hcl   Social History   Tobacco Use  . Smoking status: Never Smoker  . Smokeless tobacco: Never Used  Vaping Use  . Vaping Use: Never used  Substance Use Topics  . Alcohol use: Yes    Comment: occas  . Drug use: No     Family History: The patient's family history includes Dementia in her maternal grandmother; Diabetes in her maternal grandfather; Heart disease in her maternal grandfather; Hypertension in her maternal grandmother; Thyroid disease in her maternal grandmother; Urolithiasis in her mother.  ROS:   Please see the history of present illness.    All other systems reviewed and are negative.  EKGs/Labs/Other Studies Reviewed:    The  following studies were reviewed today:   Recent Labs: 07/03/2020: ALT 17; BUN 18; Creatinine 0.82; Hemoglobin 14.0; Platelet Count 258; Potassium 4.0; Sodium 141  Recent Lipid Panel    Component Value Date/Time   CHOL 206 (H) 10/27/2012 1343   TRIG 130 10/27/2012 1343   HDL 55 10/27/2012 1343   CHOLHDL 3.7 10/27/2012 1343   VLDL 26 10/27/2012 1343   LDLCALC 125 (H) 10/27/2012 1343    Physical Exam:    VS:  BP 104/76   Pulse 86   Ht 5\' 2"  (1.575 m)   Wt 162 lb (73.5 kg)   SpO2 98%   BMI 29.63 kg/m     Wt Readings from Last 5 Encounters:   08/16/20 162 lb (73.5 kg)  05/22/20 170 lb (77.1 kg)  05/16/20 169 lb (76.7 kg)  05/07/20 171 lb 14.4 oz (78 kg)  03/21/20 168 lb 4.8 oz (76.3 kg)    Constitutional: No acute distress Eyes: sclera non-icteric, normal conjunctiva and lids ENMT: normal dentition, moist mucous membranes Cardiovascular: regular rhythm, normal rate, no murmurs. S1 and S2 normal. Radial pulses normal bilaterally. No jugular venous distention.  Respiratory: clear to auscultation bilaterally GI : normal bowel sounds, soft and nontender. No distention.   MSK: extremities warm, well perfused. No edema.  NEURO: grossly nonfocal exam, moves all extremities. PSYCH: alert and oriented x 3, normal mood and affect.   ASSESSMENT:    1. Shortness of breath   2. Pernicious anemia   3. Iron deficiency anemia, unspecified iron deficiency anemia type    PLAN:    Reviewed in detail. No cardiovascular origin for symptoms. Recommend continued follow up with PCP and hematology for remainder of workup. Reviewed symptoms of POTS which largely include conservative measures, which may be helpful to try.   Total time of encounter: 30 minutes total time of encounter, including 25 minutes spent in face-to-face patient care on the date of this encounter. This time includes coordination of care and counseling regarding above mentioned problem list. Remainder of non-face-to-face time involved reviewing chart documents/testing relevant to the patient encounter and documentation in the medical record. I have independently reviewed documentation from referring provider.   05/21/20, MD [place]  [facility]    Medication Adjustments/Labs and Tests Ordered: Current medicines are reviewed at length with the patient today.  Concerns regarding medicines are outlined above.   No orders of the defined types were placed in this encounter.   Shared Decision Making/Informed Consent:       No orders of the defined types were  placed in this encounter.   Patient Instructions  Medication Instructions:  No Changes In Medications at this time.  *If you need a refill on your cardiac medications before your next appointment, please call your pharmacy*  Follow-Up: At [facility], you and your health needs are our priority.  As part of our continuing mission to provide you with exceptional heart care, we have created designated Provider Care Teams.  These Care Teams include your primary Cardiologist (physician) and Advanced Practice Providers (APPs -  Physician Assistants and Nurse Practitioners) who all work together to provide you with the care you need, when you need it.  Your next appointment:   AS NEEDED   The format for your next appointment:   In Person  Provider:   [facility], MD  Other Instructions   -avoid dehydration. Often it requires high volumes of fluids, often with salt/electrolytes included, to stay hydrated. Oral rehydration is  preferred, and routine use of IV fluids is not recommended. -if tolerated, compression stocking can assist with fluid management and prevent pooling in the legs. -slow position changes are recommended -if there is a feeling of severe lightheadedness, like near to passing out, recommend lying on the floor on the back, with legs elevated up on a chair or up against the wall. -the best long term management of POTS symptoms is gradual exercise conditioning. I recommend seated exercises such as bike to start, to avoid the risk of falling with lightheadedness. Exercise programs, either through supervised programs like cardiac rehab or through personal programs, should focus on gradually increasing exercise tolerance and conditioning.  -we discussed the typical spectrum of dysautonomia, including typical populations, that this sometimes spontaneously improves with age (though a small percentage have persistent symptoms), that this has uncomfortable symptoms but is not  associated with long term mortality, and that the etiology/treatment of this is an area of active research

## 2020-08-16 NOTE — Patient Instructions (Addendum)
Medication Instructions:  No Changes In Medications at this time.  *If you need a refill on your cardiac medications before your next appointment, please call your pharmacy*  Follow-Up: At [facility], you and your health needs are our priority.  As part of our continuing mission to provide you with exceptional heart care, we have created designated Provider Care Teams.  These Care Teams include your primary Cardiologist (physician) and Advanced Practice Providers (APPs -  Physician Assistants and Nurse Practitioners) who all work together to provide you with the care you need, when you need it.  Your next appointment:   AS NEEDED   The format for your next appointment:   In Person  Provider:   Weston Brass, MD  Other Instructions   -avoid dehydration. Often it requires high volumes of fluids, often with salt/electrolytes included, to stay hydrated. Oral rehydration is preferred, and routine use of IV fluids is not recommended. -if tolerated, compression stocking can assist with fluid management and prevent pooling in the legs. -slow position changes are recommended -if there is a feeling of severe lightheadedness, like near to passing out, recommend lying on the floor on the back, with legs elevated up on a chair or up against the wall. -the best long term management of POTS symptoms is gradual exercise conditioning. I recommend seated exercises such as bike to start, to avoid the risk of falling with lightheadedness. Exercise programs, either through supervised programs like cardiac rehab or through personal programs, should focus on gradually increasing exercise tolerance and conditioning.  -we discussed the typical spectrum of dysautonomia, including typical populations, that this sometimes spontaneously improves with age (though a small percentage have persistent symptoms), that this has uncomfortable symptoms but is not associated with long term mortality, and that the  etiology/treatment of this is an area of active research

## 2020-09-04 ENCOUNTER — Telehealth: Payer: Self-pay | Admitting: *Deleted

## 2020-09-04 NOTE — Telephone Encounter (Signed)
Received call from pt stating recent Vitamin D level drawn by PCP office and is showing low levels and is experiencing fatigue.  Pt requesting to be seen  by MD.  Per MD last office note, pt needing to follow up with PCP regarding profound fatigue. Pt states PCP has left the practice and is currently seeing a new provider at the same practice but has not established this provider as her PCP at this time.  RN offered next available apt with MD March 17th but pt refused and requested to speak with scheduling.  RN forwarded call to schedulers. MD requested RN to call PCP office and request current provider resume care of pt Vitamin D deficiency.  RN placed call to [facility] and receptionist stated she would not be able to give our office the name of provider pt is currently seeing but would send a message to that provider and nurse regarding Vitamin D  and fatigue.

## 2020-09-05 ENCOUNTER — Encounter: Payer: Self-pay | Admitting: *Deleted

## 2020-09-09 NOTE — Assessment & Plan Note (Signed)
Bone marrow biopsy 07/03/2020: 50 to 60% cellularity, normocellular bone marrow with trilineage hematopoiesis.  No dyspoietic or dysplastic changes noted Cytogenetics Neg

## 2020-09-09 NOTE — Progress Notes (Signed)
Patient Care Team: Jenny Reichmann, PA-C (Inactive) as PCP - General (Physician Assistant)  DIAGNOSIS:    ICD-10-CM   1. Pernicious anemia  D51.0     CHIEF COMPLIANT: Follow-up of anemia  INTERVAL HISTORY: Meredith [name] is a 40 y.o. with above-mentioned history of pernicious anemia who is receiving a weekly B12 injections. Recent labs showed Vitamin D deficiency. She presents to the clinic today for follow-up.  She continues to suffer from profound fatigue.  She cannot sit stand or walk for more than 5 minutes.  She feels so weak and drowsy that she has to rest or even sleep.  This happens throughout the day and her mom is her primary caregiver.  She takes care of her children as well.  She is experiencing some neurological symptoms in the right arm and she is seeing neurology for that.  She is profoundly deficient in vitamin D as well as B12 related to pernicious anemia.  ALLERGIES:  is allergic to morphine and related and promethazine hcl.  MEDICATIONS:  Current Outpatient Medications  Medication Sig Dispense Refill  . ergocalciferol (VITAMIN D2) 1.25 MG (50000 UT) capsule Take 1 capsule (50,000 Units total) by mouth once a week. 30 capsule 6  . ferrous sulfate 220 (44 Fe) MG/5ML solution Take 220 mg by mouth daily. Injection administered biweekly/weekly depending on levels.    . vitamin B-12 (CYANOCOBALAMIN) 100 MCG tablet See admin instructions.     No current facility-administered medications for this visit.    PHYSICAL EXAMINATION: ECOG PERFORMANCE STATUS: 1 - Symptomatic but completely ambulatory  Vitals:   09/10/20 0848  BP: 110/61  Pulse: 77  Resp: 18  Temp: (!) 97.5 F (36.4 C)  SpO2: 100%   Filed Weights   09/10/20 0848  Weight: 165 lb 1.6 oz (74.9 kg)     LABORATORY DATA:  I have reviewed the data as listed CMP Latest Ref Rng & Units 07/03/2020 10/27/2019 07/15/2014  Glucose 70 - 99 mg/dL 104(H) 97 108(H)  BUN 6 - 20 mg/dL '18 11 17  ' Creatinine  0.44 - 1.00 mg/dL 0.82 0.79 0.75  Sodium 135 - 145 mmol/L 141 140 135  Potassium 3.5 - 5.1 mmol/L 4.0 4.1 3.6  Chloride 98 - 111 mmol/L 108 105 105  CO2 22 - 32 mmol/L '25 27 25  ' Calcium 8.9 - 10.3 mg/dL 9.2 9.1 9.1  Total Protein 6.5 - 8.1 g/dL 7.0 7.3 8.1  Total Bilirubin 0.3 - 1.2 mg/dL 0.4 0.5 0.8  Alkaline Phos 38 - 126 U/L 69 69 76  AST 15 - 41 U/L 15 13(L) 21  ALT 0 - 44 U/L '17 11 24    ' Lab Results  Component Value Date   WBC 7.4 07/03/2020   HGB 14.0 07/03/2020   HCT 41.0 07/03/2020   MCV 87.8 07/03/2020   PLT 258 07/03/2020   NEUTROABS 4.7 07/03/2020    ASSESSMENT & PLAN:  Pernicious anemia Bone marrow biopsy 07/03/2020: 50 to 60% cellularity, normocellular bone marrow with trilineage hematopoiesis.  No dyspoietic or dysplastic changes noted Cytogenetics Neg Recommendation: Sublingual B12 5000 mcg twice a week Severe vitamin D deficiency: I encouraged doubling up on vitamin D.  She will take 50,000 units of vitamin D twice a week.  Severe and profound fatigue: She applied for disability and it was denied. I filled the paperwork that she brought along from her legal attorney. Return to clinic in 6 months with labs and follow-up.  No orders of the defined  types were placed in this encounter.  The patient has a good understanding of the overall plan. she agrees with it. she will call with any problems that may develop before the next visit here.  Total time spent: 30 mins including face to face time and time spent for planning, charting and coordination of care  Rulon Eisenmenger, MD, MPH 09/10/2020  I, Cloyde Reams Dorshimer, am acting as scribe for Dr. Nicholas Lose.  I have reviewed the above documentation for accuracy and completeness, and I agree with the above.

## 2020-09-10 ENCOUNTER — Encounter: Payer: Self-pay | Admitting: Diagnostic Neuroimaging

## 2020-09-10 ENCOUNTER — Other Ambulatory Visit: Payer: Self-pay

## 2020-09-10 ENCOUNTER — Inpatient Hospital Stay: Payer: Medicaid Other | Attending: Hematology and Oncology | Admitting: Hematology and Oncology

## 2020-09-10 ENCOUNTER — Ambulatory Visit: Payer: Medicaid Other | Admitting: Diagnostic Neuroimaging

## 2020-09-10 VITALS — BP 113/74 | HR 77 | Ht 63.0 in | Wt 166.0 lb

## 2020-09-10 DIAGNOSIS — E559 Vitamin D deficiency, unspecified: Secondary | ICD-10-CM | POA: Diagnosis not present

## 2020-09-10 DIAGNOSIS — D510 Vitamin B12 deficiency anemia due to intrinsic factor deficiency: Secondary | ICD-10-CM | POA: Insufficient documentation

## 2020-09-10 DIAGNOSIS — R5383 Other fatigue: Secondary | ICD-10-CM | POA: Insufficient documentation

## 2020-09-10 DIAGNOSIS — M79604 Pain in right leg: Secondary | ICD-10-CM

## 2020-09-10 MED ORDER — ERGOCALCIFEROL 1.25 MG (50000 UT) PO CAPS: 50000.0000 [IU] | ORAL_CAPSULE | ORAL | 3 refills | Status: DC

## 2020-09-10 NOTE — Patient Instructions (Signed)
RIGHT LEG PAIN (likely musculoskeletal) - refer to PT evaluation; then may consider MRI lumbar spine if fails conservative mgmt  RIGHT HAND PAIN - follow up with hand surgery clinic  DEPRESSION / STRESS - follow up with PCP

## 2020-09-10 NOTE — Progress Notes (Signed)
[facility]  PATIENT: Meredith [name] DOB: 01/14/1981  REFERRING CLINICIAN: Delfin Gant, MD HISTORY FROM: patient  REASON FOR VISIT: follow up   HISTORICAL  CHIEF COMPLAINT:  Chief Complaint  Patient presents with   Pain in RLE    Rm 6 new referral, mom- Chip Boer "Dr Penni Bombard saw degeneration of spine on body scan; he wants scans of my spine and brain"    HISTORY OF PRESENT ILLNESS:   UPDATE (09/10/20, [facility]): Since last visit, doing poorly. More fatigue, depression, pain, confusion. Also reports history of premature birth, developmental delay, special education classes. Still with right leg pain.   PRIOR HPI: 40 year old female here for evaluation of pain.  History of iron deficiency anemia, pernicious anemia vitamin B-12 deficiency, lymphocytic gastritis.  For past 2 years patient has had right greater than left leg pain, numbness, shooting pain sensation.  Symptoms radiate down the right anterior leg, knee and into her right foot.  No low back pain.  She has some right hand pain and weakness problems also.  Patient has been diagnosed with B12 deficiency now on replacement.  Started a couple months ago.   REVIEW OF SYSTEMS: Full 14 system review of systems performed and negative with exception of: As per HPI.  ALLERGIES: Allergies  Allergen Reactions   Morphine And Related Nausea And Vomiting    Pt can take percocet without problems   Promethazine Hcl Other (See Comments)    hypotention    HOME MEDICATIONS: Outpatient Medications Prior to Visit  Medication Sig Dispense Refill   cyanocobalamin (,VITAMIN B-12,) 1000 MCG/ML injection Inject into the muscle. Take depending on my levels     [START ON 09/12/2020] ergocalciferol (VITAMIN D2) 1.25 MG (50000 UT) capsule Take 1 capsule (50,000 Units total) by mouth 2 (two) times a week. 24 capsule 3   ferrous sulfate 220 (44 Fe) MG/5ML solution Take 220 mg by mouth daily. Injection administered  biweekly/weekly depending on levels.     vitamin B-12 (CYANOCOBALAMIN) 100 MCG tablet See admin instructions.     cholecalciferol (VITAMIN D3) 25 MCG (1000 UNIT) tablet 09/10/20 not started yet (Patient not taking: Reported on 09/10/2020)     No facility-administered medications prior to visit.    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, none since    PAST SURGICAL HISTORY: Past Surgical History:  Procedure Laterality Date   CESAREAN SECTION  jan 2009   CESAREAN SECTION WITH BILATERAL TUBAL LIGATION N/A 04/07/2014   Procedure: CESAREAN SECTION WITH BILATERAL TUBAL LIGATION;  Surgeon: Sharon Seller, DO;  Location: [facility];  Service: Obstetrics;  Laterality: N/A;   mirena iud N/A 2009   NEPHROLITHOTOMY  08/03/2011   Procedure: NEPHROLITHOTOMY PERCUTANEOUS;  Surgeon: Kathi Ludwig, MD;  Location: [facility];  Service: Urology;  Laterality: Left;   NEPHROSTOMY  08/03/11   left   right wrist surgery  2005   I and D , infection to the bone   WISDOM TOOTH EXTRACTION  2006    FAMILY HISTORY: Family History  Problem Relation Age of Onset   Urolithiasis Mother    Hypertension Maternal Grandmother    Thyroid disease Maternal Grandmother    Dementia Maternal Grandmother    Heart disease Maternal Grandfather    Diabetes Maternal Grandfather     SOCIAL  HISTORY: Social History   Socioeconomic History   Marital status: Married    Spouse name: Renda Rolls   Number of children: 2   Years of education: Not on file   Highest education level: Associate degree: academic program  Occupational History    Comment: dental and medical assistant  Tobacco Use   Smoking status: Never Smoker   Smokeless tobacco: Never Used  Vaping Use   Vaping Use: Never used  Substance and Sexual Activity   Alcohol  use: Yes    Comment: occas   Drug use: No   Sexual activity: Yes    Partners: Male    Birth control/protection: I.U.D.    Comment: BTL  Other Topics Concern   Not on file  Social History Narrative   Lives with family   Social Determinants of Health   Financial Resource Strain: Not on file  Food Insecurity: Not on file  Transportation Needs: Not on file  Physical Activity: Not on file  Stress: Not on file  Social Connections: Not on file  Intimate Partner Violence: Not on file     PHYSICAL EXAM  GENERAL EXAM/CONSTITUTIONAL: Vitals:  Vitals:   09/10/20 1035  BP: 113/74  Pulse: 77  Weight: 166 lb (75.3 kg)  Height: 5\' 3"  (1.6 m)   Body mass index is 29.41 kg/m. Wt Readings from Last 3 Encounters:  09/10/20 166 lb (75.3 kg)  09/10/20 165 lb 1.6 oz (74.9 kg)  08/16/20 162 lb (73.5 kg)    Patient is in no distress; well developed, nourished and groomed; neck is supple  CARDIOVASCULAR:  Examination of carotid arteries is normal; no carotid bruits  Regular rate and rhythm, no murmurs  Examination of peripheral vascular system by observation and palpation is normal  EYES:  Ophthalmoscopic exam of optic discs and posterior segments is normal; no papilledema or hemorrhages No exam data present  MUSCULOSKELETAL:  Gait, strength, tone, movements noted in Neurologic exam below  NEUROLOGIC: MENTAL STATUS:  No flowsheet data found.  awake, alert, oriented to person, place and time  recent and remote memory intact  normal attention and concentration  language fluent, comprehension intact, naming intact  fund of knowledge appropriate  CRANIAL NERVE:   2nd - no papilledema on fundoscopic exam  2nd, 3rd, 4th, 6th - pupils equal and reactive to light, visual fields full to confrontation, extraocular muscles intact, no nystagmus  5th - facial sensation symmetric  7th - facial strength symmetric  8th - hearing intact  9th - palate elevates  symmetrically, uvula midline  11th - shoulder shrug symmetric  12th - tongue protrusion midline  MOTOR:   normal bulk and tone, full strength in the BUE, BLE; SLIGHTLY LIMITED IN RIGHT LEG DUE TO PAIN  SENSORY:   normal and symmetric to light touch, temperature, vibration  COORDINATION:   finger-nose-finger, fine finger movements normal  REFLEXES:   deep tendon reflexes 2+ and symmetric  GAIT/STATION:   narrow based gait     DIAGNOSTIC DATA (LABS, IMAGING, TESTING) - I reviewed patient records, labs, notes, testing and imaging myself where available.  Lab Results  Component Value Date   WBC 7.4 07/03/2020   HGB 14.0 07/03/2020   HCT 41.0 07/03/2020   MCV 87.8 07/03/2020   PLT 258 07/03/2020      Component Value Date/Time   NA 141 07/03/2020 0829   K 4.0 07/03/2020 0829   CL 108 07/03/2020 0829   CO2 25 07/03/2020 0829   GLUCOSE 104 (H) 07/03/2020  0829   BUN 18 07/03/2020 0829   CREATININE 0.82 07/03/2020 0829   CREATININE 0.79 10/27/2012 1343   CALCIUM 9.2 07/03/2020 0829   PROT 7.0 07/03/2020 0829   ALBUMIN 4.0 07/03/2020 0829   AST 15 07/03/2020 0829   ALT 17 07/03/2020 0829   ALKPHOS 69 07/03/2020 0829   BILITOT 0.4 07/03/2020 0829   GFRNONAA >60 07/03/2020 0829   GFRAA >60 10/27/2019 1112   Lab Results  Component Value Date   CHOL 206 (H) 10/27/2012   HDL 55 10/27/2012   LDLCALC 125 (H) 10/27/2012   TRIG 130 10/27/2012   CHOLHDL 3.7 10/27/2012   Lab Results  Component Value Date   HGBA1C 5.4 10/27/2012   Lab Results  Component Value Date   VITAMINB12 309 07/03/2020   Lab Results  Component Value Date   TSH 3.001 02/03/2019    12/28/19 EMG/NCS - There is a normal study.  No electrodiagnostic evidence of large fiber neuropathy at this time.    ASSESSMENT AND PLAN  40 y.o. year old female here with:  Dx:  1. Right leg pain      PLAN:  RIGHT LEG PAIN (likely musculoskeletal) - refer to PT evaluation; then may consider  MRI lumbar spine if fails conservative mgmt  RIGHT HAND PAIN - follow up with hand surgery clinic  DEPRESSION / STRESS - follow up with PCP  Orders Placed This Encounter  Procedures   Ambulatory referral to Physical Therapy   Return for return to PCP.    Suanne Marker, MD 09/10/2020, 11:07 AM Certified in Neurology, Neurophysiology and Neuroimaging  [facility] [address] [place], [place] [address] [phone]

## 2020-09-25 ENCOUNTER — Encounter: Payer: Self-pay | Admitting: *Deleted

## 2020-09-25 NOTE — Progress Notes (Signed)
Received recent Vitamin B lab results from Meredith Maris NP with Meredith [name] showing pt Vitamin B7 low at <0.05.  The rest of pt B lab results are WNL.  Reviewed results with MD.  MD recommends pt to f/u with PCP regarding tx of low B7 considering our office does not draw B7 labs or treat low B7.  RN placed call to nurse with Dr. Drema Pry and LVM with MD recommendations.  RN also called pt to inform pt that PCP will follow care regarding low B7. Pt verbalized understanding.

## 2020-11-13 ENCOUNTER — Other Ambulatory Visit: Payer: Medicaid Other

## 2020-11-15 ENCOUNTER — Ambulatory Visit: Payer: Medicaid Other | Admitting: Hematology and Oncology

## 2020-12-20 ENCOUNTER — Telehealth: Payer: Self-pay | Admitting: *Deleted

## 2020-12-20 ENCOUNTER — Telehealth: Payer: Self-pay | Admitting: Hematology and Oncology

## 2020-12-20 NOTE — Telephone Encounter (Signed)
Received call from pt stating she had a f/u visit with Dr. Mechele Collin with [facility] for low B7 levels.  Pt states Dr. Mechele Collin has suggest that she be started on weekly b12 injections.  Pt states due to the current price in gas, she can not afford to drive to [place] every week for injections.  Pt requesting office visit with MD to review records with Dr. Mechele Collin and start b12 injections with [place].  Scheduling message sent.

## 2020-12-20 NOTE — Telephone Encounter (Signed)
Scheduled appointment per 06/10 sch msg. Patient is aware. 

## 2020-12-21 NOTE — Progress Notes (Signed)
Patient Care Team: Etter Sjogren as PCP - General (Physician Assistant)  DIAGNOSIS:    ICD-10-CM   1. Pernicious anemia  D51.0 CBC with Differential ([facility] Only)    Ferritin    Iron and TIBC      CHIEF COMPLIANT: Follow-up of anemia  INTERVAL HISTORY: Meredith [name] is a 40 y.o. with above-mentioned history of pernicious anemia and Vitamin D deficiency who is receiving a weekly B12 injections. Labs on 11/19/20 show Iron saturation 13%, Ferritin 33.6, HG 13.9, HCT 42.8, HCT 42.8, Platelets 331. She presents to the clinic today for follow-up.  In the interim she had seen a hematologist at [facility] who treated her with B12 injections and 1 dose of IV iron.  She has not noticed any difference.  She wants to resume hematology care with [place].  She continues to feel extremely fatigued.  ALLERGIES:  is allergic to morphine and related and promethazine hcl.  MEDICATIONS:  Current Outpatient Medications  Medication Sig Dispense Refill   cholecalciferol (VITAMIN D3) 25 MCG (1000 UNIT) tablet 09/10/20 not started yet (Patient not taking: Reported on 09/10/2020)     cyanocobalamin (,VITAMIN B-12,) 1000 MCG/ML injection Inject into the muscle. Take depending on my levels     ergocalciferol (VITAMIN D2) 1.25 MG (50000 UT) capsule Take 1 capsule (50,000 Units total) by mouth 2 (two) times a week. 24 capsule 3   ferrous sulfate 220 (44 Fe) MG/5ML solution Take 220 mg by mouth daily. Injection administered biweekly/weekly depending on levels.     No current facility-administered medications for this visit.    PHYSICAL EXAMINATION: ECOG PERFORMANCE STATUS: 1 - Symptomatic but completely ambulatory  Vitals:   12/23/20 1438  BP: 107/63  Pulse: 96  Resp: 16  Temp: 98.1 F (36.7 C)  SpO2: 99%   Filed Weights   12/23/20 1438  Weight: 165 lb 6.4 oz (75 kg)       LABORATORY DATA:  I have reviewed the data as listed CMP Latest Ref Rng & Units 07/03/2020  10/27/2019 07/15/2014  Glucose 70 - 99 mg/dL 104(H) 97 108(H)  BUN 6 - 20 mg/dL '18 11 17  ' Creatinine 0.44 - 1.00 mg/dL 0.82 0.79 0.75  Sodium 135 - 145 mmol/L 141 140 135  Potassium 3.5 - 5.1 mmol/L 4.0 4.1 3.6  Chloride 98 - 111 mmol/L 108 105 105  CO2 22 - 32 mmol/L '25 27 25  ' Calcium 8.9 - 10.3 mg/dL 9.2 9.1 9.1  Total Protein 6.5 - 8.1 g/dL 7.0 7.3 8.1  Total Bilirubin 0.3 - 1.2 mg/dL 0.4 0.5 0.8  Alkaline Phos 38 - 126 U/L 69 69 76  AST 15 - 41 U/L 15 13(L) 21  ALT 0 - 44 U/L '17 11 24    ' Lab Results  Component Value Date   WBC 7.4 07/03/2020   HGB 14.0 07/03/2020   HCT 41.0 07/03/2020   MCV 87.8 07/03/2020   PLT 258 07/03/2020   NEUTROABS 4.7 07/03/2020    ASSESSMENT & PLAN:  Pernicious anemia Bone marrow biopsy 07/03/2020: 50 to 60% cellularity, normocellular bone marrow with trilineage hematopoiesis.  No dyspoietic or dysplastic changes noted Cytogenetics Neg Recommendation: Sublingual B12 5000 mcg twice a week Severe vitamin D deficiency:  50,000 units of vitamin D twice a week.   Severe and profound fatigue: She applied for disability and it was denied. Lab review: Profound B12 deficiency: Somehow we had discontinued B12 injections accidentally.  Therefore I would like her to  resume it we will do weekly x4 and then monthly thereafter.  Return to clinic in 4 weeks with labs and follow-up    Orders Placed This Encounter  Procedures   CBC with Differential ([place] Only)    Standing Status:   Future    Standing Expiration Date:   12/23/2021   Ferritin    Standing Status:   Future    Standing Expiration Date:   12/23/2021   Iron and TIBC    Standing Status:   Future    Standing Expiration Date:   12/23/2021    The patient has a good understanding of the overall plan. she agrees with it. she will call with any problems that may develop before the next visit here.  Total time spent: 20 mins including face to face time and time spent for planning, charting and  coordination of care  Rulon Eisenmenger, MD, MPH 12/23/2020  I, Thana Ates, am acting as scribe for Dr. Nicholas Lose.  I have reviewed the above documentation for accuracy and completeness, and I agree with the above.

## 2020-12-23 ENCOUNTER — Inpatient Hospital Stay: Payer: Medicaid Other

## 2020-12-23 ENCOUNTER — Other Ambulatory Visit: Payer: Self-pay

## 2020-12-23 ENCOUNTER — Inpatient Hospital Stay: Payer: Medicaid Other | Attending: Hematology and Oncology | Admitting: Hematology and Oncology

## 2020-12-23 DIAGNOSIS — E559 Vitamin D deficiency, unspecified: Secondary | ICD-10-CM | POA: Diagnosis not present

## 2020-12-23 DIAGNOSIS — D510 Vitamin B12 deficiency anemia due to intrinsic factor deficiency: Secondary | ICD-10-CM | POA: Diagnosis not present

## 2020-12-23 NOTE — Assessment & Plan Note (Signed)
Bone marrow biopsy 07/03/2020: 50 to 60% cellularity, normocellular bone marrow with trilineage hematopoiesis. No dyspoietic or dysplastic changes noted Cytogenetics Neg Recommendation: Sublingual B12 5000 mcg twice a week Severe vitamin D deficiency:  50,000 units of vitamin D twice a week.  Severe and profound fatigue: Meredith [name] applied for disability and it was denied. Lab review:

## 2020-12-27 ENCOUNTER — Other Ambulatory Visit: Payer: Self-pay

## 2020-12-27 ENCOUNTER — Inpatient Hospital Stay: Payer: Medicaid Other

## 2020-12-27 VITALS — BP 108/62 | HR 86 | Temp 98.1°F | Resp 16

## 2020-12-27 DIAGNOSIS — E538 Deficiency of other specified B group vitamins: Secondary | ICD-10-CM

## 2020-12-27 DIAGNOSIS — D510 Vitamin B12 deficiency anemia due to intrinsic factor deficiency: Secondary | ICD-10-CM | POA: Diagnosis not present

## 2020-12-27 MED ORDER — CYANOCOBALAMIN 1000 MCG/ML IJ SOLN
1000.0000 ug | Freq: Once | INTRAMUSCULAR | Status: AC
  Administered 2020-12-27: 1000 ug via INTRAMUSCULAR

## 2021-01-03 ENCOUNTER — Other Ambulatory Visit: Payer: Self-pay

## 2021-01-03 ENCOUNTER — Inpatient Hospital Stay: Payer: Medicaid Other

## 2021-01-03 DIAGNOSIS — E538 Deficiency of other specified B group vitamins: Secondary | ICD-10-CM

## 2021-01-03 DIAGNOSIS — D510 Vitamin B12 deficiency anemia due to intrinsic factor deficiency: Secondary | ICD-10-CM | POA: Diagnosis not present

## 2021-01-03 MED ORDER — CYANOCOBALAMIN 1000 MCG/ML IJ SOLN
1000.0000 ug | Freq: Once | INTRAMUSCULAR | Status: AC
  Administered 2021-01-03: 1000 ug via INTRAMUSCULAR

## 2021-01-03 NOTE — Patient Instructions (Signed)
Vitamin B12 Injection What is this medication? Vitamin B12 (VAHY tuh min B12) prevents and treats low vitamin B12 levels in your body. It is used in people who do not get enough vitamin B12 from their diet or when their digestive tract does not absorb enough. Vitamin B12 plays an important role in maintaining the health of your nervous system and red bloodcells. This medicine may be used for other purposes; ask your health care provider orpharmacist if you have questions. COMMON BRAND NAME(S): B-12 Compliance Kit, B-12 Injection Kit, Cyomin, LA-12,Nutri-Twelve, Physicians EZ Use B-12, Primabalt What should I tell my care team before I take this medication? They need to know if you have any of these conditions: Kidney disease Leber's disease Megaloblastic anemia An unusual or allergic reaction to cyanocobalamin, cobalt, other medications, foods, dyes, or preservatives Pregnant or trying to get pregnant Breast-feeding How should I use this medication? This medication is injected into a muscle or deeply under the skin. It is usually given in a clinic or care team's office. However, your care team mayteach you how to inject yourself. Follow all instructions. Talk to your care team about the use of this medication in children. Specialcare may be needed. Overdosage: If you think you have taken too much of this medicine contact apoison control center or emergency room at once. NOTE: This medicine is only for you. Do not share this medicine with others. What if I miss a dose? If you are given your dose at a clinic or care team's office, call to reschedule your appointment. If you give your own injections, and you miss a dose, take it as soon as you can. If it is almost time for your next dose, takeonly that dose. Do not take double or extra doses. What may interact with this medication? Colchicine Heavy alcohol intake This list may not describe all possible interactions. Give your health care provider  a list of all the medicines, herbs, non-prescription drugs, or dietary supplements you use. Also tell them if you smoke, drink alcohol, or use illegaldrugs. Some items may interact with your medicine. What should I watch for while using this medication? Visit your care team regularly. You may need blood work done while you aretaking this medication. You may need to follow a special diet. Talk to your care team. Limit youralcohol intake and avoid smoking to get the best benefit. What side effects may I notice from receiving this medication? Side effects that you should report to your care team as soon as possible: Allergic reactions-skin rash, itching, hives, swelling of the face, lips, tongue, or throat Swelling of the ankles, hands, or feet Trouble breathing Side effects that usually do not require medical attention (report to your careteam if they continue or are bothersome): Diarrhea This list may not describe all possible side effects. Call your doctor for medical advice about side effects. You may report side effects to FDA at1-800-FDA-1088. Where should I keep my medication? Keep out of the reach of children. Store at room temperature between 15 and 30 degrees C (59 and 85 degrees F).Protect from light. Throw away any unused medication after the expiration date. NOTE: This sheet is a summary. It may not cover all possible information. If you have questions about this medicine, talk to your doctor, pharmacist, orhealth care provider.  2022 Elsevier/Gold Standard (2020-08-19 11:47:06)  

## 2021-01-10 ENCOUNTER — Ambulatory Visit: Payer: Medicaid Other

## 2021-01-10 ENCOUNTER — Telehealth: Payer: Self-pay | Admitting: Hematology and Oncology

## 2021-01-10 ENCOUNTER — Inpatient Hospital Stay: Payer: Medicaid Other | Attending: Hematology and Oncology

## 2021-01-10 ENCOUNTER — Other Ambulatory Visit: Payer: Self-pay

## 2021-01-10 DIAGNOSIS — D510 Vitamin B12 deficiency anemia due to intrinsic factor deficiency: Secondary | ICD-10-CM | POA: Insufficient documentation

## 2021-01-10 DIAGNOSIS — E559 Vitamin D deficiency, unspecified: Secondary | ICD-10-CM | POA: Diagnosis not present

## 2021-01-10 DIAGNOSIS — R5382 Chronic fatigue, unspecified: Secondary | ICD-10-CM | POA: Insufficient documentation

## 2021-01-10 DIAGNOSIS — E538 Deficiency of other specified B group vitamins: Secondary | ICD-10-CM

## 2021-01-10 MED ORDER — CYANOCOBALAMIN 1000 MCG/ML IJ SOLN
INTRAMUSCULAR | Status: AC
  Filled 2021-01-10: qty 1

## 2021-01-10 MED ORDER — CYANOCOBALAMIN 1000 MCG/ML IJ SOLN
1000.0000 ug | Freq: Once | INTRAMUSCULAR | Status: AC
  Administered 2021-01-10: 1000 ug via INTRAMUSCULAR

## 2021-01-10 NOTE — Patient Instructions (Signed)
Vitamin B12 Injection What is this medication? Vitamin B12 (VAHY tuh min B12) prevents and treats low vitamin B12 levels in your body. It is used in people who do not get enough vitamin B12 from their diet or when their digestive tract does not absorb enough. Vitamin B12 plays an important role in maintaining the health of your nervous system and red bloodcells. This medicine may be used for other purposes; ask your health care provider orpharmacist if you have questions. COMMON BRAND NAME(S): B-12 Compliance Kit, B-12 Injection Kit, Cyomin, LA-12,Nutri-Twelve, Physicians EZ Use B-12, Primabalt What should I tell my care team before I take this medication? They need to know if you have any of these conditions: Kidney disease Leber's disease Megaloblastic anemia An unusual or allergic reaction to cyanocobalamin, cobalt, other medications, foods, dyes, or preservatives Pregnant or trying to get pregnant Breast-feeding How should I use this medication? This medication is injected into a muscle or deeply under the skin. It is usually given in a clinic or care team's office. However, your care team mayteach you how to inject yourself. Follow all instructions. Talk to your care team about the use of this medication in children. Specialcare may be needed. Overdosage: If you think you have taken too much of this medicine contact apoison control center or emergency room at once. NOTE: This medicine is only for you. Do not share this medicine with others. What if I miss a dose? If you are given your dose at a clinic or care team's office, call to reschedule your appointment. If you give your own injections, and you miss a dose, take it as soon as you can. If it is almost time for your next dose, takeonly that dose. Do not take double or extra doses. What may interact with this medication? Colchicine Heavy alcohol intake This list may not describe all possible interactions. Give your health care provider  a list of all the medicines, herbs, non-prescription drugs, or dietary supplements you use. Also tell them if you smoke, drink alcohol, or use illegaldrugs. Some items may interact with your medicine. What should I watch for while using this medication? Visit your care team regularly. You may need blood work done while you aretaking this medication. You may need to follow a special diet. Talk to your care team. Limit youralcohol intake and avoid smoking to get the best benefit. What side effects may I notice from receiving this medication? Side effects that you should report to your care team as soon as possible: Allergic reactions-skin rash, itching, hives, swelling of the face, lips, tongue, or throat Swelling of the ankles, hands, or feet Trouble breathing Side effects that usually do not require medical attention (report to your careteam if they continue or are bothersome): Diarrhea This list may not describe all possible side effects. Call your doctor for medical advice about side effects. You may report side effects to FDA at1-800-FDA-1088. Where should I keep my medication? Keep out of the reach of children. Store at room temperature between 15 and 30 degrees C (59 and 85 degrees F).Protect from light. Throw away any unused medication after the expiration date. NOTE: This sheet is a summary. It may not cover all possible information. If you have questions about this medicine, talk to your doctor, pharmacist, orhealth care provider.  2022 Elsevier/Gold Standard (2020-08-19 11:47:06)  

## 2021-01-10 NOTE — Telephone Encounter (Signed)
R/s appt per 7/1 sch msg. Pt aware.  

## 2021-01-16 ENCOUNTER — Other Ambulatory Visit: Payer: Self-pay

## 2021-01-16 DIAGNOSIS — R5382 Chronic fatigue, unspecified: Secondary | ICD-10-CM

## 2021-01-16 DIAGNOSIS — E538 Deficiency of other specified B group vitamins: Secondary | ICD-10-CM

## 2021-01-16 DIAGNOSIS — D510 Vitamin B12 deficiency anemia due to intrinsic factor deficiency: Secondary | ICD-10-CM

## 2021-01-16 NOTE — Progress Notes (Signed)
Patient Care Team: Etter Sjogren as PCP - General (Physician Assistant)  DIAGNOSIS:    ICD-10-CM   1. Pernicious anemia  D51.0 CBC with Differential ([facility] Only)      CHIEF COMPLIANT:  Follow-up of anemia  INTERVAL HISTORY: Meredith [name] is a 40 y.o. with above-mentioned history of pernicious anemia and Vitamin D deficiency who is receiving a weekly B12 injections. She presents to the clinic today for follow-up.  She reports to me that the B12 injections have not helped her.  She does not notice any improvement in her energy levels.  ALLERGIES:  is allergic to morphine and related and promethazine hcl.  MEDICATIONS:  Current Outpatient Medications  Medication Sig Dispense Refill   cholecalciferol (VITAMIN D3) 25 MCG (1000 UNIT) tablet 09/10/20 not started yet (Patient not taking: Reported on 09/10/2020)     cyanocobalamin (,VITAMIN B-12,) 1000 MCG/ML injection Inject into the muscle. Take depending on my levels     ergocalciferol (VITAMIN D2) 1.25 MG (50000 UT) capsule Take 1 capsule (50,000 Units total) by mouth 2 (two) times a week. 24 capsule 3   ferrous sulfate 220 (44 Fe) MG/5ML solution Take 220 mg by mouth daily. Injection administered biweekly/weekly depending on levels.     No current facility-administered medications for this visit.    PHYSICAL EXAMINATION: ECOG PERFORMANCE STATUS: 2 - Symptomatic, <50% confined to bed  Vitals:   01/17/21 1009  BP: 107/66  Pulse: 77  Resp: 18  Temp: 97.8 F (36.6 C)  SpO2: 100%   Filed Weights   01/17/21 1009  Weight: 166 lb 1.6 oz (75.3 kg)    LABORATORY DATA:  I have reviewed the data as listed CMP Latest Ref Rng & Units 01/17/2021 07/03/2020 10/27/2019  Glucose 70 - 99 mg/dL 98 104(H) 97  BUN 6 - 20 mg/dL '11 18 11  ' Creatinine 0.44 - 1.00 mg/dL 0.82 0.82 0.79  Sodium 135 - 145 mmol/L 142 141 140  Potassium 3.5 - 5.1 mmol/L 3.7 4.0 4.1  Chloride 98 - 111 mmol/L 109 108 105  CO2 22 - 32 mmol/L  '23 25 27  ' Calcium 8.9 - 10.3 mg/dL 9.1 9.2 9.1  Total Protein 6.5 - 8.1 g/dL 7.2 7.0 7.3  Total Bilirubin 0.3 - 1.2 mg/dL 0.3 0.4 0.5  Alkaline Phos 38 - 126 U/L 68 69 69  AST 15 - 41 U/L 11(L) 15 13(L)  ALT 0 - 44 U/L '11 17 11    ' Lab Results  Component Value Date   WBC 8.1 01/17/2021   HGB 14.3 01/17/2021   HCT 42.0 01/17/2021   MCV 87.7 01/17/2021   PLT 278 01/17/2021   NEUTROABS 5.1 01/17/2021    ASSESSMENT & PLAN:  Pernicious anemia Bone marrow biopsy 07/03/2020: 50 to 60% cellularity, normocellular bone marrow with trilineage hematopoiesis.  No dyspoietic or dysplastic changes noted Cytogenetics Neg Recommendation: Sublingual B12 5000 mcg twice a week Severe vitamin D deficiency:  50,000 units of vitamin D twice a week.   Severe and profound fatigue: She applied for disability and it was denied. Lab review: Profound B12 deficiency: Resume B12 injections There has not been any improvement in her energy levels in spite of B12 replacement therapy.  Return to clinic monthly for B12 injections and every 3 months with labs and follow-up with me.    Orders Placed This Encounter  Procedures   CBC with Differential ([place] Only)    Standing Status:   Future  Standing Expiration Date:   01/17/2022   The patient has a good understanding of the overall plan. she agrees with it. she will call with any problems that may develop before the next visit here.  Total time spent: 20 mins including face to face time and time spent for planning, charting and coordination of care  Rulon Eisenmenger, MD, MPH 01/17/2021  I, Thana Ates, am acting as scribe for Dr. Nicholas Lose.  I have reviewed the above documentation for accuracy and completeness, and I agree with the above.

## 2021-01-17 ENCOUNTER — Telehealth: Payer: Self-pay | Admitting: Hematology and Oncology

## 2021-01-17 ENCOUNTER — Inpatient Hospital Stay (HOSPITAL_BASED_OUTPATIENT_CLINIC_OR_DEPARTMENT_OTHER): Payer: Medicaid Other | Admitting: Hematology and Oncology

## 2021-01-17 ENCOUNTER — Other Ambulatory Visit: Payer: Self-pay | Admitting: *Deleted

## 2021-01-17 ENCOUNTER — Other Ambulatory Visit: Payer: Self-pay

## 2021-01-17 ENCOUNTER — Inpatient Hospital Stay: Payer: Medicaid Other

## 2021-01-17 DIAGNOSIS — E538 Deficiency of other specified B group vitamins: Secondary | ICD-10-CM

## 2021-01-17 DIAGNOSIS — D510 Vitamin B12 deficiency anemia due to intrinsic factor deficiency: Secondary | ICD-10-CM

## 2021-01-17 DIAGNOSIS — R5382 Chronic fatigue, unspecified: Secondary | ICD-10-CM

## 2021-01-17 LAB — IRON AND TIBC
Iron: 52 ug/dL (ref 41–142)
Saturation Ratios: 20 % — ABNORMAL LOW (ref 21–57)
TIBC: 260 ug/dL (ref 236–444)
UIBC: 208 ug/dL (ref 120–384)

## 2021-01-17 LAB — CBC WITH DIFFERENTIAL (CANCER CENTER ONLY)
Abs Immature Granulocytes: 0.02 10*3/uL (ref 0.00–0.07)
Basophils Absolute: 0.1 10*3/uL (ref 0.0–0.1)
Basophils Relative: 1 %
Eosinophils Absolute: 0.2 10*3/uL (ref 0.0–0.5)
Eosinophils Relative: 3 %
HCT: 42 % (ref 36.0–46.0)
Hemoglobin: 14.3 g/dL (ref 12.0–15.0)
Immature Granulocytes: 0 %
Lymphocytes Relative: 26 %
Lymphs Abs: 2.1 10*3/uL (ref 0.7–4.0)
MCH: 29.9 pg (ref 26.0–34.0)
MCHC: 34 g/dL (ref 30.0–36.0)
MCV: 87.7 fL (ref 80.0–100.0)
Monocytes Absolute: 0.6 10*3/uL (ref 0.1–1.0)
Monocytes Relative: 7 %
Neutro Abs: 5.1 10*3/uL (ref 1.7–7.7)
Neutrophils Relative %: 63 %
Platelet Count: 278 10*3/uL (ref 150–400)
RBC: 4.79 MIL/uL (ref 3.87–5.11)
RDW: 12.7 % (ref 11.5–15.5)
WBC Count: 8.1 10*3/uL (ref 4.0–10.5)
nRBC: 0 % (ref 0.0–0.2)

## 2021-01-17 LAB — CMP (CANCER CENTER ONLY)
ALT: 11 U/L (ref 0–44)
AST: 11 U/L — ABNORMAL LOW (ref 15–41)
Albumin: 4 g/dL (ref 3.5–5.0)
Alkaline Phosphatase: 68 U/L (ref 38–126)
Anion gap: 10 (ref 5–15)
BUN: 11 mg/dL (ref 6–20)
CO2: 23 mmol/L (ref 22–32)
Calcium: 9.1 mg/dL (ref 8.9–10.3)
Chloride: 109 mmol/L (ref 98–111)
Creatinine: 0.82 mg/dL (ref 0.44–1.00)
GFR, Estimated: >60 mL/min (ref >60)
Glucose, Bld: 98 mg/dL (ref 70–99)
Potassium: 3.7 mmol/L (ref 3.5–5.1)
Sodium: 142 mmol/L (ref 135–145)
Total Bilirubin: 0.3 mg/dL (ref 0.3–1.2)
Total Protein: 7.2 g/dL (ref 6.5–8.1)

## 2021-01-17 LAB — FERRITIN: Ferritin: 95 ng/mL (ref 11–307)

## 2021-01-17 LAB — TSH: TSH: 4.177 u[IU]/mL — ABNORMAL HIGH (ref 0.308–3.960)

## 2021-01-17 LAB — VITAMIN D 25 HYDROXY (VIT D DEFICIENCY, FRACTURES): Vit D, 25-Hydroxy: 25.01 ng/mL — ABNORMAL LOW (ref 30–100)

## 2021-01-17 MED ORDER — CYANOCOBALAMIN 1000 MCG/ML IJ SOLN
INTRAMUSCULAR | Status: AC
  Filled 2021-01-17: qty 1

## 2021-01-17 MED ORDER — CYANOCOBALAMIN 1000 MCG/ML IJ SOLN
1000.0000 ug | Freq: Once | INTRAMUSCULAR | Status: AC
  Administered 2021-01-17: 1000 ug via INTRAMUSCULAR

## 2021-01-17 NOTE — Assessment & Plan Note (Signed)
Bone marrow biopsy 07/03/2020: 50 to 60% cellularity, normocellular bone marrow with trilineage hematopoiesis. No dyspoietic or dysplastic changes noted Cytogenetics Neg Recommendation: Sublingual B12 5000 mcg twice a week Severe vitamin D deficiency:  50,000 units of vitamin D twice a week.  Severe and profound fatigue: She applied for disability and it was denied. Lab review: Profound B12 deficiency: Resume B12 injections  Return to clinic monthly for B12 injections and every 3 months with labs and follow-up with me.

## 2021-01-17 NOTE — Telephone Encounter (Signed)
Scheduled appointments per 07/08 los. Patient said she would call to scheduled future injections after September.

## 2021-01-17 NOTE — Patient Instructions (Signed)
Vitamin B12 Injection What is this medication? Vitamin B12 (VAHY tuh min B12) prevents and treats low vitamin B12 levels in your body. It is used in people who do not get enough vitamin B12 from their diet or when their digestive tract does not absorb enough. Vitamin B12 plays an important role in maintaining the health of your nervous system and red bloodcells. This medicine may be used for other purposes; ask your health care provider orpharmacist if you have questions. COMMON BRAND NAME(S): B-12 Compliance Kit, B-12 Injection Kit, Cyomin, LA-12,Nutri-Twelve, Physicians EZ Use B-12, Primabalt What should I tell my care team before I take this medication? They need to know if you have any of these conditions: Kidney disease Leber's disease Megaloblastic anemia An unusual or allergic reaction to cyanocobalamin, cobalt, other medications, foods, dyes, or preservatives Pregnant or trying to get pregnant Breast-feeding How should I use this medication? This medication is injected into a muscle or deeply under the skin. It is usually given in a clinic or care team's office. However, your care team mayteach you how to inject yourself. Follow all instructions. Talk to your care team about the use of this medication in children. Specialcare may be needed. Overdosage: If you think you have taken too much of this medicine contact apoison control center or emergency room at once. NOTE: This medicine is only for you. Do not share this medicine with others. What if I miss a dose? If you are given your dose at a clinic or care team's office, call to reschedule your appointment. If you give your own injections, and you miss a dose, take it as soon as you can. If it is almost time for your next dose, takeonly that dose. Do not take double or extra doses. What may interact with this medication? Colchicine Heavy alcohol intake This list may not describe all possible interactions. Give your health care provider  a list of all the medicines, herbs, non-prescription drugs, or dietary supplements you use. Also tell them if you smoke, drink alcohol, or use illegaldrugs. Some items may interact with your medicine. What should I watch for while using this medication? Visit your care team regularly. You may need blood work done while you aretaking this medication. You may need to follow a special diet. Talk to your care team. Limit youralcohol intake and avoid smoking to get the best benefit. What side effects may I notice from receiving this medication? Side effects that you should report to your care team as soon as possible: Allergic reactions-skin rash, itching, hives, swelling of the face, lips, tongue, or throat Swelling of the ankles, hands, or feet Trouble breathing Side effects that usually do not require medical attention (report to your careteam if they continue or are bothersome): Diarrhea This list may not describe all possible side effects. Call your doctor for medical advice about side effects. You may report side effects to FDA at1-800-FDA-1088. Where should I keep my medication? Keep out of the reach of children. Store at room temperature between 15 and 30 degrees C (59 and 85 degrees F).Protect from light. Throw away any unused medication after the expiration date. NOTE: This sheet is a summary. It may not cover all possible information. If you have questions about this medicine, talk to your doctor, pharmacist, orhealth care provider.  2022 Elsevier/Gold Standard (2020-08-19 11:47:06)  

## 2021-01-20 ENCOUNTER — Telehealth: Payer: Self-pay | Admitting: *Deleted

## 2021-01-20 NOTE — Telephone Encounter (Signed)
Tawyna called to see if Dr Pamelia Hoit can explain abnormal lab results

## 2021-01-21 ENCOUNTER — Encounter: Payer: Self-pay | Admitting: Hematology and Oncology

## 2021-01-21 NOTE — Telephone Encounter (Signed)
Notified of message below. Is going to talk with endocrinologist about TSH

## 2021-01-27 ENCOUNTER — Other Ambulatory Visit: Payer: Self-pay | Admitting: Family Medicine

## 2021-01-27 DIAGNOSIS — R519 Headache, unspecified: Secondary | ICD-10-CM

## 2021-01-27 DIAGNOSIS — H538 Other visual disturbances: Secondary | ICD-10-CM

## 2021-01-27 DIAGNOSIS — R5382 Chronic fatigue, unspecified: Secondary | ICD-10-CM

## 2021-01-27 DIAGNOSIS — R42 Dizziness and giddiness: Secondary | ICD-10-CM

## 2021-01-27 DIAGNOSIS — G47419 Narcolepsy without cataplexy: Secondary | ICD-10-CM

## 2021-02-13 ENCOUNTER — Ambulatory Visit
Admission: RE | Admit: 2021-02-13 | Discharge: 2021-02-13 | Disposition: A | Discharge status: Home / Self Care | Payer: Medicaid Other | Source: Ambulatory Visit | Attending: Family Medicine | Admitting: Family Medicine

## 2021-02-13 ENCOUNTER — Other Ambulatory Visit: Payer: Self-pay

## 2021-02-13 DIAGNOSIS — R5382 Chronic fatigue, unspecified: Secondary | ICD-10-CM

## 2021-02-13 DIAGNOSIS — H538 Other visual disturbances: Secondary | ICD-10-CM

## 2021-02-13 DIAGNOSIS — R42 Dizziness and giddiness: Secondary | ICD-10-CM

## 2021-02-13 DIAGNOSIS — R519 Headache, unspecified: Secondary | ICD-10-CM

## 2021-02-13 DIAGNOSIS — G47419 Narcolepsy without cataplexy: Secondary | ICD-10-CM

## 2021-02-14 ENCOUNTER — Inpatient Hospital Stay: Payer: Medicaid Other | Attending: Hematology and Oncology

## 2021-02-14 DIAGNOSIS — D510 Vitamin B12 deficiency anemia due to intrinsic factor deficiency: Secondary | ICD-10-CM | POA: Insufficient documentation

## 2021-02-14 DIAGNOSIS — E538 Deficiency of other specified B group vitamins: Secondary | ICD-10-CM

## 2021-02-14 MED ORDER — CYANOCOBALAMIN 1000 MCG/ML IJ SOLN
INTRAMUSCULAR | Status: AC
  Filled 2021-02-14: qty 1

## 2021-02-14 MED ORDER — CYANOCOBALAMIN 1000 MCG/ML IJ SOLN
1000.0000 ug | Freq: Once | INTRAMUSCULAR | Status: AC
  Administered 2021-02-14: 1000 ug via INTRAMUSCULAR

## 2021-02-14 NOTE — Patient Instructions (Signed)
Vitamin B12 Injection What is this medication? Vitamin B12 (VAHY tuh min B12) prevents and treats low vitamin B12 levels in your body. It is used in people who do not get enough vitamin B12 from their diet or when their digestive tract does not absorb enough. Vitamin B12 plays an important role in maintaining the health of your nervous system and red bloodcells. This medicine may be used for other purposes; ask your health care provider orpharmacist if you have questions. COMMON BRAND NAME(S): B-12 Compliance Kit, B-12 Injection Kit, Cyomin, LA-12,Nutri-Twelve, Physicians EZ Use B-12, Primabalt What should I tell my care team before I take this medication? They need to know if you have any of these conditions: Kidney disease Leber's disease Megaloblastic anemia An unusual or allergic reaction to cyanocobalamin, cobalt, other medications, foods, dyes, or preservatives Pregnant or trying to get pregnant Breast-feeding How should I use this medication? This medication is injected into a muscle or deeply under the skin. It is usually given in a clinic or care team's office. However, your care team mayteach you how to inject yourself. Follow all instructions. Talk to your care team about the use of this medication in children. Specialcare may be needed. Overdosage: If you think you have taken too much of this medicine contact apoison control center or emergency room at once. NOTE: This medicine is only for you. Do not share this medicine with others. What if I miss a dose? If you are given your dose at a clinic or care team's office, call to reschedule your appointment. If you give your own injections, and you miss a dose, take it as soon as you can. If it is almost time for your next dose, takeonly that dose. Do not take double or extra doses. What may interact with this medication? Colchicine Heavy alcohol intake This list may not describe all possible interactions. Give your health care provider  a list of all the medicines, herbs, non-prescription drugs, or dietary supplements you use. Also tell them if you smoke, drink alcohol, or use illegaldrugs. Some items may interact with your medicine. What should I watch for while using this medication? Visit your care team regularly. You may need blood work done while you aretaking this medication. You may need to follow a special diet. Talk to your care team. Limit youralcohol intake and avoid smoking to get the best benefit. What side effects may I notice from receiving this medication? Side effects that you should report to your care team as soon as possible: Allergic reactions-skin rash, itching, hives, swelling of the face, lips, tongue, or throat Swelling of the ankles, hands, or feet Trouble breathing Side effects that usually do not require medical attention (report to your careteam if they continue or are bothersome): Diarrhea This list may not describe all possible side effects. Call your doctor for medical advice about side effects. You may report side effects to FDA at1-800-FDA-1088. Where should I keep my medication? Keep out of the reach of children. Store at room temperature between 15 and 30 degrees C (59 and 85 degrees F).Protect from light. Throw away any unused medication after the expiration date. NOTE: This sheet is a summary. It may not cover all possible information. If you have questions about this medicine, talk to your doctor, pharmacist, orhealth care provider.  2022 Elsevier/Gold Standard (2020-08-19 11:47:06)  

## 2021-03-06 NOTE — Progress Notes (Signed)
NEW PATIENT Date of Service/Encounter:  03/07/21 Referring provider: Kathi Der, MD Primary care provider: Hillery Aldo, PA-C  Subjective:  Meredith [name] is a 40 y.o. female with a PMHx of pernicious anemia, Vitamin D, B12 and iron deficiency, thyroid disease, lymphocytic gastritis presenting today for evaluation of immune evaluation. History obtained from: chart review and patient.   She has pernicious anemia with vitamin B12 and D deficiency and thryoid disease.  She was referred by her GI specialist to rule out immune deficiency given her multiple autoimmune diseases.  Infection History: 2 lifetime pneumonias, none in the last year, recurrent ear infections as a child, and only 1 as an adult, yearly sinus infections requiring antibiotics, no recurrent skin infections or abscesses-but has been hospitalized once for staph abscess requiring intraoperative I&D with IV antibiotics after failing PO antibiotics in 2005. She has received 3-4 antibiotics courses in the past year for 2 sinus infections, but doesn't remember what the other ones were for. previous immune evaluation - none 2 courses of IV antibiotics in the setting of her Staph infection and once for pyelonephritis in 2012-records reviewed of her imaging at that time and no abscess was found, only large nonobstructing stone in the left kidney Vaccine status: up-to-date with exception of COVID 19 vaccine Was 6 weeks preterm and required ICU care.  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 She does have seasonal allergies. Takes Zyrtec PRN.  Family Hx: No family history of immunodeficiency Childhood cancer in PGF - brain tumor  MGM-dementia No family history of autoimmunity.  Parental Consanguinity: no  Other allergy screening: Asthma: no Rhino conjunctivitis: yes Food allergy: no Medication allergy: no Hymenoptera allergy: no Urticaria: no Eczema:no History of recurrent infections suggestive  of immunodeficency: yes Vaccinations are up to date.   Past Medical History: Past Medical History:  Diagnosis Date   Hypothyroidism    Iron deficiency anemia    pernicious anemia   Pyelonephritis 06/21/2011   10 kidney stones surgically removed   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, none since   Medication List:  Current Outpatient Medications  Medication Sig Dispense Refill   cetirizine (ZYRTEC) 10 MG tablet Take 10 mg by mouth as needed for allergies.     cyanocobalamin (,VITAMIN B-12,) 1000 MCG/ML injection Inject into the muscle. Take depending on my levels     ergocalciferol (VITAMIN D2) 1.25 MG (50000 UT) capsule Take 1 capsule (50,000 Units total) by mouth 2 (two) times a week. 24 capsule 3   ferrous sulfate 220 (44 Fe) MG/5ML solution Take 220 mg by mouth daily. Injection administered biweekly/weekly depending on levels.     levothyroxine (SYNTHROID) 25 MCG tablet Take 25 mcg by mouth every morning.     No current facility-administered medications for this visit.   Known Allergies:  Allergies  Allergen Reactions   Morphine And Related Nausea And Vomiting    Pt can take percocet without problems   Promethazine Hcl Other (See Comments)    hypotention   Past Surgical History: Past Surgical History:  Procedure Laterality Date   CESAREAN SECTION  jan 2009   CESAREAN SECTION WITH BILATERAL TUBAL LIGATION N/A 04/07/2014   Procedure: CESAREAN SECTION WITH BILATERAL TUBAL LIGATION;  Surgeon: Sharon Seller, DO;  Location: [facility];  Service: Obstetrics;  Laterality: N/A;   mirena iud N/A 2009   NEPHROLITHOTOMY  08/03/2011   Procedure: NEPHROLITHOTOMY PERCUTANEOUS;  Surgeon: Kathi Ludwig, MD;  Location: [facility];  Service: Urology;  Laterality: Left;   NEPHROSTOMY  08/03/11   left   right wrist surgery  2005   I and D , infection to the bone   WISDOM TOOTH EXTRACTION  2006   Family History: Family History  Problem Relation Age of Onset   Allergic rhinitis Mother     Urolithiasis Mother    Sinusitis Mother    Hypertension Maternal Grandmother    Thyroid disease Maternal Grandmother    Dementia Maternal Grandmother    Heart disease Maternal Grandfather    Diabetes Maternal Grandfather    Eczema Son    Allergic rhinitis Son    Allergic rhinitis Son    Eczema Daughter    Asthma Neg Hx    Immunodeficiency Neg Hx    Angioedema Neg Hx    Social History: Meredith [name] lives In a house built in [place], vinyl flooring, heat pump, central AC, no pets, no visible cockroaches, not using dust mite covers for bedding, no smoke exposure, currently on disability but has worked in the past as a Product manager with [employer], no HEPA filter in the home, home not an industrial area or near interstate.  ROS:  All other systems negative except as noted per HPI.  Objective:  Blood pressure 116/76, pulse 87, temperature 98.3 F (36.8 C), temperature source Temporal, height 5\' 3"  (1.6 m), weight 166 lb (75.3 kg), SpO2 98 %. Body mass index is 29.41 kg/m. Physical Exam:   General Appearance:  Alert, cooperative, no distress, appears stated age  Head:  Normocephalic, without obvious abnormality, atraumatic  Eyes:  Conjunctiva clear, EOM's intact  Nose: Nares normal, normal turbinates  Throat: Lips, tongue normal; teeth and gums normal, normal posterior oropharynx  Neck: Supple, symmetrical, no LAD  Lungs:   CTAB, no wheezing, Respirations unlabored, no coughing  Heart:  RRR, no murmur, Appears well perfused  Extremities: No edema  Skin: Skin color, texture, turgor normal, no rashes or lesions on visualized portions of skin  Neurologic: No gross deficits   Diagnostics: Immune labs (see below)  Reviewed labs and imaging:  01/17/21-CBCd with normal counts and differential, normal ALC spanning back to 2021 Review of hematology BM biopsy 07/03/20: 50 to 60% cellularity, normocellular bone marrow with trilineage hematopoiesis.  No dyspoietic or dysplastic changes  noted Cytogenetics Neg 02/13/21: CT heat wo Unremarkable non-contrast CT appearance of the brain. No evidence of acute intracranial abnormality. Bilateral ethmoid and left sphenoid sinus disease, as described. Correlate for acute sinusitis.  08/03/20: NM Bone Scan: Normal whole-body bone scintigraphy. Assessment:  Recurrent infections -given her personal history of autoimmunity and recurrent sinus infections with 1 abscess plus a family history of childhood cancer, due to the immune deficiency work-up is warranted.  Have ordered screening labs today to look at overall function of humeral immune system as well as phagocytic, complement, and T-cell numbers.  Depending on initial screening findings, may consider more in-depth phagocyte or T cell function, but lower suspicion for these based on her history (only one deep skin abscess, no opportunistic infections, no personal history of eczema).   Chronic rhinitis - seasonal, possibly allergic, but controlled with AH as needed.  Plan/Recommendations:   Patient Instructions  Recurrent infections and history of autoimmunity - will obtain screening labs to rule out an immune deficiency including:  CBCd, T/B/NK enumerations, vaccine titers, complement levels, total antibody level - we will contact you once all results are back which may take a few weeks  Chronic rhinitis:  - will check enviromental allergy  panel - continue zyrtec (cetirizine) 10 mg daily  Your options include: Zyrtec (cetirizine) 10 mg, Claritin (loratadine) 10 mg, Xyzal (levocetirizine) 5 mg or Allegra (fexofenadine) 180 mg daily as needed  Follow-up pending results.  This note in its entirety was forwarded to the Provider who requested this consultation.  Thank you for your kind referral. I appreciate the opportunity to take part in Meredith [name]'s care. Please do not hesitate to contact me with questions.  Sincerely,  Tonny Bollman, MD [facility]

## 2021-03-07 ENCOUNTER — Ambulatory Visit (INDEPENDENT_AMBULATORY_CARE_PROVIDER_SITE_OTHER): Payer: Medicaid Other | Admitting: Internal Medicine

## 2021-03-07 ENCOUNTER — Other Ambulatory Visit: Payer: Self-pay

## 2021-03-07 ENCOUNTER — Encounter: Payer: Self-pay | Admitting: Internal Medicine

## 2021-03-07 VITALS — BP 116/76 | HR 87 | Temp 98.3°F | Ht 63.0 in | Wt 166.0 lb

## 2021-03-07 DIAGNOSIS — J310 Chronic rhinitis: Secondary | ICD-10-CM | POA: Diagnosis not present

## 2021-03-07 DIAGNOSIS — B999 Unspecified infectious disease: Secondary | ICD-10-CM | POA: Diagnosis not present

## 2021-03-07 NOTE — Patient Instructions (Addendum)
Recurrent infections and history of autoimmunity - will obtain screening labs to rule out an immune deficiency including:  CBCd, T/B/NK enumerations, vaccine titers, complement levels, total antibody level - we will contact you once all results are back which may take a few weeks  Chronic rhinitis:  - will check enviromental allergy panel - continue zyrtec (cetirizine) 10 mg daily  Your options include: Zyrtec (cetirizine) 10 mg, Claritin (loratadine) 10 mg, Xyzal (levocetirizine) 5 mg or Allegra (fexofenadine) 180 mg daily as needed

## 2021-03-15 LAB — ALLERGENS, ZONE 2

## 2021-03-18 ENCOUNTER — Inpatient Hospital Stay: Payer: Medicaid Other | Attending: Hematology and Oncology

## 2021-03-18 ENCOUNTER — Other Ambulatory Visit: Payer: Self-pay

## 2021-03-18 ENCOUNTER — Inpatient Hospital Stay: Payer: Medicaid Other

## 2021-03-18 ENCOUNTER — Other Ambulatory Visit: Payer: Self-pay | Admitting: *Deleted

## 2021-03-18 DIAGNOSIS — E538 Deficiency of other specified B group vitamins: Secondary | ICD-10-CM

## 2021-03-18 DIAGNOSIS — E559 Vitamin D deficiency, unspecified: Secondary | ICD-10-CM | POA: Diagnosis not present

## 2021-03-18 DIAGNOSIS — D510 Vitamin B12 deficiency anemia due to intrinsic factor deficiency: Secondary | ICD-10-CM | POA: Diagnosis not present

## 2021-03-18 LAB — VITAMIN B12: Vitamin B-12: 458 pg/mL (ref 180–914)

## 2021-03-18 LAB — CBC WITH DIFFERENTIAL (CANCER CENTER ONLY)
Abs Immature Granulocytes: 0.02 10*3/uL (ref 0.00–0.07)
Basophils Absolute: 0.1 10*3/uL (ref 0.0–0.1)
Basophils Relative: 1 %
Eosinophils Absolute: 0.1 10*3/uL (ref 0.0–0.5)
Eosinophils Relative: 2 %
HCT: 40.5 % (ref 36.0–46.0)
Hemoglobin: 13.5 g/dL (ref 12.0–15.0)
Immature Granulocytes: 0 %
Lymphocytes Relative: 29 %
Lymphs Abs: 2.3 10*3/uL (ref 0.7–4.0)
MCH: 29.6 pg (ref 26.0–34.0)
MCHC: 33.3 g/dL (ref 30.0–36.0)
MCV: 88.8 fL (ref 80.0–100.0)
Monocytes Absolute: 0.5 10*3/uL (ref 0.1–1.0)
Monocytes Relative: 7 %
Neutro Abs: 4.9 10*3/uL (ref 1.7–7.7)
Neutrophils Relative %: 61 %
Platelet Count: 271 10*3/uL (ref 150–400)
RBC: 4.56 MIL/uL (ref 3.87–5.11)
RDW: 12.8 % (ref 11.5–15.5)
WBC Count: 7.9 10*3/uL (ref 4.0–10.5)
nRBC: 0 % (ref 0.0–0.2)

## 2021-03-18 LAB — IRON AND TIBC
Iron: 37 ug/dL — ABNORMAL LOW (ref 41–142)
Saturation Ratios: 12 % — ABNORMAL LOW (ref 21–57)
TIBC: 299 ug/dL (ref 236–444)
UIBC: 262 ug/dL (ref 120–384)

## 2021-03-18 LAB — VITAMIN D 25 HYDROXY (VIT D DEFICIENCY, FRACTURES): Vit D, 25-Hydroxy: 24.04 ng/mL — ABNORMAL LOW (ref 30–100)

## 2021-03-18 LAB — FERRITIN: Ferritin: 23 ng/mL (ref 11–307)

## 2021-03-18 MED ORDER — CYANOCOBALAMIN 1000 MCG/ML IJ SOLN
1000.0000 ug | Freq: Once | INTRAMUSCULAR | Status: AC
Start: 2021-03-18 — End: 2021-03-18
  Administered 2021-03-18: 1000 ug via INTRAMUSCULAR
  Filled 2021-03-18: qty 1

## 2021-03-18 NOTE — Patient Instructions (Signed)
Vitamin B12 Injection What is this medication? Vitamin B12 (VAHY tuh min B12) prevents and treats low vitamin B12 levels in your body. It is used in people who do not get enough vitamin B12 from their diet or when their digestive tract does not absorb enough. Vitamin B12 plays an important role in maintaining the health of your nervous system and red blood cells. This medicine may be used for other purposes; ask your health care provider or pharmacist if you have questions. COMMON BRAND NAME(S): B-12 Compliance Kit, B-12 Injection Kit, Cyomin, Dodex, LA-12, Nutri-Twelve, Physicians EZ Use B-12, Primabalt What should I tell my care team before I take this medication? They need to know if you have any of these conditions: Kidney disease Leber's disease Megaloblastic anemia An unusual or allergic reaction to cyanocobalamin, cobalt, other medications, foods, dyes, or preservatives Pregnant or trying to get pregnant Breast-feeding How should I use this medication? This medication is injected into a muscle or deeply under the skin. It is usually given in a clinic or care team's office. However, your care team may teach you how to inject yourself. Follow all instructions. Talk to your care team about the use of this medication in children. Special care may be needed. Overdosage: If you think you have taken too much of this medicine contact a poison control center or emergency room at once. NOTE: This medicine is only for you. Do not share this medicine with others. What if I miss a dose? If you are given your dose at a clinic or care team's office, call to reschedule your appointment. If you give your own injections, and you miss a dose, take it as soon as you can. If it is almost time for your next dose, take only that dose. Do not take double or extra doses. What may interact with this medication? Colchicine Heavy alcohol intake This list may not describe all possible interactions. Give your health  care provider a list of all the medicines, herbs, non-prescription drugs, or dietary supplements you use. Also tell them if you smoke, drink alcohol, or use illegal drugs. Some items may interact with your medicine. What should I watch for while using this medication? Visit your care team regularly. You may need blood work done while you are taking this medication. You may need to follow a special diet. Talk to your care team. Limit your alcohol intake and avoid smoking to get the best benefit. What side effects may I notice from receiving this medication? Side effects that you should report to your care team as soon as possible: Allergic reactions-skin rash, itching, hives, swelling of the face, lips, tongue, or throat Swelling of the ankles, hands, or feet Trouble breathing Side effects that usually do not require medical attention (report to your care team if they continue or are bothersome): Diarrhea This list may not describe all possible side effects. Call your doctor for medical advice about side effects. You may report side effects to FDA at 1-800-FDA-1088. Where should I keep my medication? Keep out of the reach of children. Store at room temperature between 15 and 30 degrees C (59 and 85 degrees F). Protect from light. Throw away any unused medication after the expiration date. NOTE: This sheet is a summary. It may not cover all possible information. If you have questions about this medicine, talk to your doctor, pharmacist, or health care provider.  2022 Elsevier/Gold Standard (2020-08-19 11:47:06)

## 2021-03-19 NOTE — Progress Notes (Signed)
Patient Care Team: Etter Sjogren as PCP - General (Physician Assistant)  DIAGNOSIS:    ICD-10-CM   1. Pernicious anemia  D51.0       CHIEF COMPLIANT: Follow-up of anemia  INTERVAL HISTORY: Meredith [name] is a 40 y.o. with above-mentioned history of  pernicious anemia and Vitamin D deficiency who is receiving a weekly B12 injections. She presents to the clinic today for follow-up.  She has been receiving B12 injections but has not noticed any perceivable difference in her energy levels.  She has noticed intense pain in her right lower extremity from the knee up to her ankles.  This was so bad that she was contemplating on coming to the emergency room.  Ibuprofen appears to have improved it.  She is currently on B12 supplement 50,000 international units twice a week.  Her levels were still low when we checked.  ALLERGIES:  is allergic to morphine and related and promethazine hcl.  MEDICATIONS:  Current Outpatient Medications  Medication Sig Dispense Refill   cetirizine (ZYRTEC) 10 MG tablet Take 10 mg by mouth as needed for allergies.     cyanocobalamin (,VITAMIN B-12,) 1000 MCG/ML injection Inject into the muscle. Take depending on my levels     ergocalciferol (VITAMIN D2) 1.25 MG (50000 UT) capsule Take 1 capsule (50,000 Units total) by mouth 2 (two) times a week. 24 capsule 3   ferrous sulfate 220 (44 Fe) MG/5ML solution Take 220 mg by mouth daily. Injection administered biweekly/weekly depending on levels.     levothyroxine (SYNTHROID) 25 MCG tablet Take 25 mcg by mouth every morning.     No current facility-administered medications for this visit.    PHYSICAL EXAMINATION: ECOG PERFORMANCE STATUS: 1 - Symptomatic but completely ambulatory  Vitals:   03/20/21 1022  BP: 115/77  Pulse: 81  Resp: (!) 81  Temp: 97.9 F (36.6 C)  SpO2: 99%   Filed Weights   03/20/21 1022  Weight: 164 lb 6.4 oz (74.6 kg)    LABORATORY DATA:  I have reviewed the data as  listed CMP Latest Ref Rng & Units 01/17/2021 07/03/2020 10/27/2019  Glucose 70 - 99 mg/dL 98 104(H) 97  BUN 6 - 20 mg/dL '11 18 11  ' Creatinine 0.44 - 1.00 mg/dL 0.82 0.82 0.79  Sodium 135 - 145 mmol/L 142 141 140  Potassium 3.5 - 5.1 mmol/L 3.7 4.0 4.1  Chloride 98 - 111 mmol/L 109 108 105  CO2 22 - 32 mmol/L '23 25 27  ' Calcium 8.9 - 10.3 mg/dL 9.1 9.2 9.1  Total Protein 6.5 - 8.1 g/dL 7.2 7.0 7.3  Total Bilirubin 0.3 - 1.2 mg/dL 0.3 0.4 0.5  Alkaline Phos 38 - 126 U/L 68 69 69  AST 15 - 41 U/L 11(L) 15 13(L)  ALT 0 - 44 U/L '11 17 11    ' Lab Results  Component Value Date   WBC 7.9 03/18/2021   HGB 13.5 03/18/2021   HCT 40.5 03/18/2021   MCV 88.8 03/18/2021   PLT 271 03/18/2021   NEUTROABS 4.9 03/18/2021    ASSESSMENT & PLAN:  Pernicious anemia Bone marrow biopsy 07/03/2020: 50 to 60% cellularity, normocellular bone marrow with trilineage hematopoiesis.  No dyspoietic or dysplastic changes noted Cytogenetics Neg Recommendation: Sublingual B12 5000 mcg twice a week Severe vitamin D deficiency:  50,000 units of vitamin D twice a week.   Severe and profound fatigue: She applied for disability and it was denied. Lab review: Profound B12 deficiency:  Continue  B12 injections There has not been any improvement in her energy levels in spite of B12 replacement therapy.   Right lower extremity pain: Could be vitamin D deficiency but we will check magnesium levels to see if she needs magnesium replacement. Hypothyroidism: She was started on Synthroid and endocrinologist is monitoring her levels.  Return to clinic monthly for B12 injections and every 3 months with labs and follow-up with me.    No orders of the defined types were placed in this encounter.  The patient has a good understanding of the overall plan. she agrees with it. she will call with any problems that may develop before the next visit here.  Total time spent: 20 mins including face to face time and time spent for  planning, charting and coordination of care  Rulon Eisenmenger, MD, MPH 03/20/2021  I, Thana Ates, am acting as scribe for Dr. Nicholas Lose.  I have reviewed the above documentation for accuracy and completeness, and I agree with the above.

## 2021-03-20 ENCOUNTER — Other Ambulatory Visit: Payer: Self-pay

## 2021-03-20 ENCOUNTER — Other Ambulatory Visit: Payer: Self-pay | Admitting: *Deleted

## 2021-03-20 ENCOUNTER — Inpatient Hospital Stay: Payer: Medicaid Other | Admitting: Hematology and Oncology

## 2021-03-20 DIAGNOSIS — D510 Vitamin B12 deficiency anemia due to intrinsic factor deficiency: Secondary | ICD-10-CM | POA: Diagnosis not present

## 2021-03-20 LAB — MAGNESIUM: Magnesium: 2 mg/dL (ref 1.7–2.4)

## 2021-03-20 NOTE — Assessment & Plan Note (Signed)
Bone marrow biopsy 07/03/2020: 50 to 60% cellularity, normocellular bone marrow with trilineage hematopoiesis. No dyspoietic or dysplastic changes noted Cytogenetics Neg Recommendation: Sublingual B12 5000 mcg twice a week Severe vitamin D deficiency: 50,000 units of vitamin D twice a week.  Severe and profound fatigue: She applied for disability and it was denied. Lab review:Profound B12 deficiency:  Continue B12 injections There has not been any improvement in her energy levels in spite of B12 replacement therapy.  Return to clinic monthly for B12 injections and every 6 months with labs and follow-up with me.

## 2021-03-21 ENCOUNTER — Other Ambulatory Visit: Payer: Self-pay | Admitting: Internal Medicine

## 2021-03-21 DIAGNOSIS — B999 Unspecified infectious disease: Secondary | ICD-10-CM

## 2021-03-21 LAB — LYMPH ENUMERATION, BASIC & NK CELLS
% CD 3 Pos. Lymph.: 76.1 % (ref 57.5–86.2)
% CD 4 Pos. Lymph.: 51.8 % (ref 30.8–58.5)
% NK (CD56/16): 9.1 % (ref 1.4–19.4)
Ab NK (CD56/16): 218 /uL (ref 24–406)
Absolute CD 3: 1826 /uL (ref 622–2402)
Absolute CD 4 Helper: 1243 /uL (ref 359–1519)
Basophils Absolute: 0.1 10*3/uL (ref 0.0–0.2)
Basos: 1 %
CD19 % B Cell: 14.3 % (ref 3.3–25.4)
CD19 Abs: 343 /uL (ref 12–645)
CD4/CD8 Ratio: 2.22 (ref 0.92–3.72)
CD8 % Suppressor T Cell: 23.3 % (ref 12.0–35.5)
CD8 T Cell Abs: 559 /uL (ref 109–897)
EOS (ABSOLUTE): 0.2 10*3/uL (ref 0.0–0.4)
Eos: 2 %
Hematocrit: 41.4 % (ref 34.0–46.6)
Hemoglobin: 13.4 g/dL (ref 11.1–15.9)
Immature Grans (Abs): 0 10*3/uL (ref 0.0–0.1)
Immature Granulocytes: 0 %
Lymphocytes Absolute: 2.4 10*3/uL (ref 0.7–3.1)
Lymphs: 27 %
MCH: 28.8 pg (ref 26.6–33.0)
MCHC: 32.4 g/dL (ref 31.5–35.7)
MCV: 89 fL (ref 79–97)
Monocytes Absolute: 0.6 10*3/uL (ref 0.1–0.9)
Monocytes: 7 %
Neutrophils Absolute: 5.6 10*3/uL (ref 1.4–7.0)
Neutrophils: 63 %
Platelets: 279 10*3/uL (ref 150–450)
RBC: 4.66 x10E6/uL (ref 3.77–5.28)
RDW: 12.7 % (ref 11.7–15.4)
WBC: 8.9 10*3/uL (ref 3.4–10.8)

## 2021-03-21 LAB — STREP PNEUMONIAE 23 SEROTYPES IGG
Pneumo Ab Type 1*: <0.1 ug/mL — ABNORMAL LOW (ref >1.3)
Pneumo Ab Type 12 (12F)*: <0.1 ug/mL — ABNORMAL LOW (ref >1.3)
Pneumo Ab Type 14*: 6.9 ug/mL (ref >1.3)
Pneumo Ab Type 17 (17F)*: 0.4 ug/mL — ABNORMAL LOW (ref >1.3)
Pneumo Ab Type 19 (19F)*: 1 ug/mL — ABNORMAL LOW (ref >1.3)
Pneumo Ab Type 2*: 0.2 ug/mL — ABNORMAL LOW (ref >1.3)
Pneumo Ab Type 20*: 0.3 ug/mL — ABNORMAL LOW (ref >1.3)
Pneumo Ab Type 22 (22F)*: 0.8 ug/mL — ABNORMAL LOW (ref >1.3)
Pneumo Ab Type 23 (23F)*: <0.1 ug/mL — ABNORMAL LOW (ref >1.3)
Pneumo Ab Type 26 (6B)*: <0.1 ug/mL — ABNORMAL LOW (ref >1.3)
Pneumo Ab Type 3*: 1 ug/mL — ABNORMAL LOW (ref >1.3)
Pneumo Ab Type 34 (10A)*: 0.1 ug/mL — ABNORMAL LOW (ref >1.3)
Pneumo Ab Type 4*: 0.1 ug/mL — ABNORMAL LOW (ref >1.3)
Pneumo Ab Type 43 (11A)*: 0.7 ug/mL — ABNORMAL LOW (ref >1.3)
Pneumo Ab Type 5*: 0.3 ug/mL — ABNORMAL LOW (ref >1.3)
Pneumo Ab Type 51 (7F)*: 1 ug/mL — ABNORMAL LOW (ref >1.3)
Pneumo Ab Type 54 (15B)*: 0.9 ug/mL — ABNORMAL LOW (ref >1.3)
Pneumo Ab Type 56 (18C)*: 0.1 ug/mL — ABNORMAL LOW (ref >1.3)
Pneumo Ab Type 57 (19A)*: 3.8 ug/mL (ref >1.3)
Pneumo Ab Type 68 (9V)*: 0.1 ug/mL — ABNORMAL LOW (ref >1.3)
Pneumo Ab Type 70 (33F)*: 0.2 ug/mL — ABNORMAL LOW (ref >1.3)
Pneumo Ab Type 8*: <0.1 ug/mL — ABNORMAL LOW (ref >1.3)
Pneumo Ab Type 9 (9N)*: <0.1 ug/mL — ABNORMAL LOW (ref >1.3)

## 2021-03-21 LAB — COMPLEMENT, TOTAL: Compl, Total (CH50): >60 U/mL (ref >41)

## 2021-03-21 LAB — IGG, IGA, IGM
IgA/Immunoglobulin A, Serum: 234 mg/dL (ref 87–352)
IgG (Immunoglobin G), Serum: 1001 mg/dL (ref 586–1602)
IgM (Immunoglobulin M), Srm: 51 mg/dL (ref 26–217)

## 2021-03-21 LAB — DIPHTHERIA / TETANUS ANTIBODY PANEL
Diphtheria Ab: 0.82 IU/mL (ref <0.10)
Tetanus Ab, IgG: 0.87 IU/mL (ref <0.10)

## 2021-03-21 NOTE — Progress Notes (Signed)
Spoke with pt result was given, pt had questions and was little confused on why she needed to get the pneumovax vaccine if it is usually given to 65+, pt also stated that she has autoimmune disorder could it attribute to that. Pt was advise to contact her PCP to inquire about her vaccine records and schedule for the shots and to have labs repeated in 6weeks after the vaccine was administer. Pt stated to have results sent through [facility] as she ws driving and wouldn't remember.

## 2021-03-21 NOTE — Progress Notes (Signed)
Encounter created for strep titer order.

## 2021-03-21 NOTE — Progress Notes (Signed)
Please let patient know that her immune work-up is overall very reassuring with exception of low antibody titers to strep (this could be explained by her not having had this vaccine in the past (usually given to folks 39 yo and older).   We should plan to have her get the vaccine (either here or with PCP).  We will need to repeat the strep titers at least 6 weeks following her vaccination. I can place order now.

## 2021-04-25 ENCOUNTER — Ambulatory Visit: Payer: Medicaid Other

## 2021-05-02 ENCOUNTER — Ambulatory Visit: Payer: Medicaid Other

## 2021-05-02 ENCOUNTER — Inpatient Hospital Stay: Payer: Medicaid Other | Attending: Hematology and Oncology

## 2021-05-02 ENCOUNTER — Other Ambulatory Visit: Payer: Self-pay

## 2021-05-02 DIAGNOSIS — E538 Deficiency of other specified B group vitamins: Secondary | ICD-10-CM

## 2021-05-02 DIAGNOSIS — D510 Vitamin B12 deficiency anemia due to intrinsic factor deficiency: Secondary | ICD-10-CM | POA: Diagnosis not present

## 2021-05-02 MED ORDER — CYANOCOBALAMIN 1000 MCG/ML IJ SOLN
1000.0000 ug | Freq: Once | INTRAMUSCULAR | Status: AC
  Administered 2021-05-02: 1000 ug via INTRAMUSCULAR
  Filled 2021-05-02: qty 1

## 2021-05-13 ENCOUNTER — Other Ambulatory Visit (HOSPITAL_BASED_OUTPATIENT_CLINIC_OR_DEPARTMENT_OTHER): Payer: Self-pay

## 2021-05-13 DIAGNOSIS — G4719 Other hypersomnia: Secondary | ICD-10-CM

## 2021-05-13 LAB — STREP PNEUMONIAE 23 SEROTYPES IGG
Pneumo Ab Type 1*: >14.2 ug/mL (ref >1.3)
Pneumo Ab Type 12 (12F)*: >19.5 ug/mL (ref >1.3)
Pneumo Ab Type 14*: >18.7 ug/mL (ref >1.3)
Pneumo Ab Type 17 (17F)*: >20.2 ug/mL (ref >1.3)
Pneumo Ab Type 19 (19F)*: 11.6 ug/mL (ref >1.3)
Pneumo Ab Type 2*: >20.7 ug/mL (ref >1.3)
Pneumo Ab Type 20*: 11 ug/mL (ref >1.3)
Pneumo Ab Type 22 (22F)*: 2.5 ug/mL (ref >1.3)
Pneumo Ab Type 23 (23F)*: 0.8 ug/mL — ABNORMAL LOW (ref >1.3)
Pneumo Ab Type 26 (6B)*: 1.7 ug/mL (ref >1.3)
Pneumo Ab Type 3*: 5.9 ug/mL (ref >1.3)
Pneumo Ab Type 34 (10A)*: 13.4 ug/mL (ref >1.3)
Pneumo Ab Type 4*: 2 ug/mL (ref >1.3)
Pneumo Ab Type 43 (11A)*: >7.6 ug/mL (ref >1.3)
Pneumo Ab Type 5*: 17.7 ug/mL (ref >1.3)
Pneumo Ab Type 51 (7F)*: >13.6 ug/mL (ref >1.3)
Pneumo Ab Type 54 (15B)*: 19.1 ug/mL (ref >1.3)
Pneumo Ab Type 56 (18C)*: 3.2 ug/mL (ref >1.3)
Pneumo Ab Type 57 (19A)*: >33.6 ug/mL (ref >1.3)
Pneumo Ab Type 68 (9V)*: >13.4 ug/mL (ref >1.3)
Pneumo Ab Type 70 (33F)*: >10.1 ug/mL (ref >1.3)
Pneumo Ab Type 8*: 8 ug/mL (ref >1.3)
Pneumo Ab Type 9 (9N)*: 4.2 ug/mL (ref >1.3)

## 2021-05-13 NOTE — Progress Notes (Signed)
Please let Mrs. Meredith [name] know that her repeat strep titers showed EXCELLENT response to the vaccine which is very reassuring.  This will provide her coverage against Strep and completes her immune work-up.   It appears to be functioning quite well from an infection fighting stand point.  The new vaccine may help prevent some sinus infections.  Thanks

## 2021-05-23 ENCOUNTER — Ambulatory Visit: Payer: Medicaid Other

## 2021-05-27 ENCOUNTER — Other Ambulatory Visit: Payer: Self-pay

## 2021-05-27 ENCOUNTER — Inpatient Hospital Stay: Payer: Medicaid Other | Attending: Hematology and Oncology

## 2021-05-27 DIAGNOSIS — E538 Deficiency of other specified B group vitamins: Secondary | ICD-10-CM

## 2021-05-27 DIAGNOSIS — D510 Vitamin B12 deficiency anemia due to intrinsic factor deficiency: Secondary | ICD-10-CM | POA: Diagnosis present

## 2021-05-27 MED ORDER — CYANOCOBALAMIN 1000 MCG/ML IJ SOLN
1000.0000 ug | Freq: Once | INTRAMUSCULAR | Status: AC
  Administered 2021-05-27: 1000 ug via INTRAMUSCULAR
  Filled 2021-05-27: qty 1

## 2021-06-25 ENCOUNTER — Other Ambulatory Visit: Payer: Self-pay

## 2021-06-25 ENCOUNTER — Inpatient Hospital Stay: Payer: Medicaid Other | Attending: Hematology and Oncology

## 2021-06-25 DIAGNOSIS — R634 Abnormal weight loss: Secondary | ICD-10-CM

## 2021-06-25 DIAGNOSIS — D510 Vitamin B12 deficiency anemia due to intrinsic factor deficiency: Secondary | ICD-10-CM | POA: Insufficient documentation

## 2021-06-25 DIAGNOSIS — D509 Iron deficiency anemia, unspecified: Secondary | ICD-10-CM

## 2021-06-25 LAB — CBC WITH DIFFERENTIAL (CANCER CENTER ONLY)
Abs Immature Granulocytes: 0.02 10*3/uL (ref 0.00–0.07)
Basophils Absolute: 0.1 10*3/uL (ref 0.0–0.1)
Basophils Relative: 1 %
Eosinophils Absolute: 0.2 10*3/uL (ref 0.0–0.5)
Eosinophils Relative: 2 %
HCT: 37.9 % (ref 36.0–46.0)
Hemoglobin: 12.2 g/dL (ref 12.0–15.0)
Immature Granulocytes: 0 %
Lymphocytes Relative: 30 %
Lymphs Abs: 2.4 10*3/uL (ref 0.7–4.0)
MCH: 27.7 pg (ref 26.0–34.0)
MCHC: 32.2 g/dL (ref 30.0–36.0)
MCV: 85.9 fL (ref 80.0–100.0)
Monocytes Absolute: 0.5 10*3/uL (ref 0.1–1.0)
Monocytes Relative: 6 %
Neutro Abs: 4.9 10*3/uL (ref 1.7–7.7)
Neutrophils Relative %: 61 %
Platelet Count: 342 10*3/uL (ref 150–400)
RBC: 4.41 MIL/uL (ref 3.87–5.11)
RDW: 12.7 % (ref 11.5–15.5)
WBC Count: 8 10*3/uL (ref 4.0–10.5)
nRBC: 0 % (ref 0.0–0.2)

## 2021-06-25 LAB — FERRITIN: Ferritin: 4 ng/mL — ABNORMAL LOW (ref 11–307)

## 2021-06-25 LAB — VITAMIN B12: Vitamin B-12: 506 pg/mL (ref 180–914)

## 2021-06-25 LAB — IRON AND TIBC
Iron: 34 ug/dL — ABNORMAL LOW (ref 41–142)
Saturation Ratios: 9 % — ABNORMAL LOW (ref 21–57)
TIBC: 380 ug/dL (ref 236–444)
UIBC: 346 ug/dL (ref 120–384)

## 2021-06-25 LAB — FOLATE: Folate: 10.2 ng/mL (ref >5.9)

## 2021-06-26 LAB — THYROID PANEL WITH TSH
Free Thyroxine Index: 2.5 (ref 1.2–4.9)
T3 Uptake Ratio: 30 % (ref 24–39)
T4, Total: 8.4 ug/dL (ref 4.5–12.0)
TSH: 0.77 u[IU]/mL (ref 0.450–4.500)

## 2021-06-26 NOTE — Progress Notes (Signed)
Patient Care Team: Etter Sjogren as PCP - General (Physician Assistant)  DIAGNOSIS:    ICD-10-CM   1. Iron deficiency anemia, unspecified iron deficiency anemia type  D50.9 Vitamin D 25 hydroxy    2. Pernicious anemia  D51.0 Vitamin D 25 hydroxy    3. Weight loss, unintentional  R63.4 Vitamin D 25 hydroxy     CHIEF COMPLIANT: Follow-up of anemia  INTERVAL HISTORY: Meredith [name] is a 40 y.o. with above-mentioned history of pernicious anemia and Vitamin D deficiency who is receiving a weekly B12 injections. She presents to the clinic today for follow-up.  She reports continued symptoms of profound fatigue.  She sleeps most of the day because she has no energy to do any activities.  She feels extremely cold.  She is unable to work because of this profound fatigue.  ALLERGIES:  is allergic to morphine and related and promethazine hcl.  MEDICATIONS:  Current Outpatient Medications  Medication Sig Dispense Refill   cetirizine (ZYRTEC) 10 MG tablet Take 10 mg by mouth as needed for allergies.     cyanocobalamin (,VITAMIN B-12,) 1000 MCG/ML injection Inject into the muscle every 30 (thirty) days. Take depending on my levels     ergocalciferol (VITAMIN D2) 1.25 MG (50000 UT) capsule Take 1 capsule (50,000 Units total) by mouth 2 (two) times a week. 24 capsule 3   ferrous sulfate 220 (44 Fe) MG/5ML solution Take 220 mg by mouth daily. Injection administered biweekly/weekly depending on levels.     levothyroxine (SYNTHROID) 50 MCG tablet Take 50 mcg by mouth daily before breakfast.     No current facility-administered medications for this visit.    PHYSICAL EXAMINATION: ECOG PERFORMANCE STATUS: 1 - Symptomatic but completely ambulatory  Vitals:   06/27/21 0959  BP: 119/65  Pulse: 86  Resp: 16  Temp: 98.4 F (36.9 C)  SpO2: 100%   Filed Weights   06/27/21 0959  Weight: 162 lb 8 oz (73.7 kg)    LABORATORY DATA:  I have reviewed the data as listed CMP  Latest Ref Rng & Units 01/17/2021 07/03/2020 10/27/2019  Glucose 70 - 99 mg/dL 98 104(H) 97  BUN 6 - 20 mg/dL '11 18 11  ' Creatinine 0.44 - 1.00 mg/dL 0.82 0.82 0.79  Sodium 135 - 145 mmol/L 142 141 140  Potassium 3.5 - 5.1 mmol/L 3.7 4.0 4.1  Chloride 98 - 111 mmol/L 109 108 105  CO2 22 - 32 mmol/L '23 25 27  ' Calcium 8.9 - 10.3 mg/dL 9.1 9.2 9.1  Total Protein 6.5 - 8.1 g/dL 7.2 7.0 7.3  Total Bilirubin 0.3 - 1.2 mg/dL 0.3 0.4 0.5  Alkaline Phos 38 - 126 U/L 68 69 69  AST 15 - 41 U/L 11(L) 15 13(L)  ALT 0 - 44 U/L '11 17 11    ' Lab Results  Component Value Date   WBC 8.0 06/25/2021   HGB 12.2 06/25/2021   HCT 37.9 06/25/2021   MCV 85.9 06/25/2021   PLT 342 06/25/2021   NEUTROABS 4.9 06/25/2021    ASSESSMENT & PLAN:  Pernicious anemia Bone marrow biopsy 07/03/2020: 50 to 60% cellularity, normocellular bone marrow with trilineage hematopoiesis.  No dyspoietic or dysplastic changes noted Cytogenetics Neg Recommendation: Sublingual B12 5000 mcg twice a week Severe vitamin D deficiency:  50,000 units of vitamin D twice a week.  We will add vitamin D labs    Severe and profound fatigue: She applied for disability and it was denied. Lab review:  06/25/2021: WBC 12.2, MCV 85.9, ferritin 4, iron saturation 9%, B12 506, folate > 5.9 Recommend intravenous iron therapy Return to clinic in 3 months with labs done ahead of time and follow-up.   Orders Placed This Encounter  Procedures   Vitamin D 25 hydroxy    Standing Status:   Standing    Number of Occurrences:   30    Standing Expiration Date:   06/27/2022   The patient has a good understanding of the overall plan. she agrees with it. she will call with any problems that may develop before the next visit here.  Total time spent: 30 mins including face to face time and time spent for planning, charting and coordination of care  Rulon Eisenmenger, MD, MPH 06/27/2021  I, Thana Ates, am acting as scribe for Dr. Nicholas Lose.  I have  reviewed the above documentation for accuracy and completeness, and I agree with the above.

## 2021-06-26 NOTE — Assessment & Plan Note (Signed)
Bone marrow biopsy 07/03/2020: 50 to 60% cellularity, normocellular bone marrow with trilineage hematopoiesis. No dyspoietic or dysplastic changes noted Cytogenetics Neg Recommendation: Sublingual B12 5000 mcg twice a week Severe vitamin D deficiency: 50,000 units of vitamin D twice a week.  Severe and profound fatigue: She applied for disability and it was denied. Lab review: 06/25/2021: WBC 12.2, MCV 85.9, ferritin 4, iron saturation 9%, B12 506, folate > 5.9 Recommend intravenous iron therapy

## 2021-06-27 ENCOUNTER — Other Ambulatory Visit: Payer: Self-pay

## 2021-06-27 ENCOUNTER — Other Ambulatory Visit: Payer: Medicaid Other

## 2021-06-27 ENCOUNTER — Inpatient Hospital Stay: Payer: Medicaid Other

## 2021-06-27 ENCOUNTER — Inpatient Hospital Stay: Payer: Medicaid Other | Admitting: Hematology and Oncology

## 2021-06-27 VITALS — BP 119/65 | HR 86 | Temp 98.4°F | Resp 16 | Ht 63.0 in | Wt 162.5 lb

## 2021-06-27 DIAGNOSIS — D510 Vitamin B12 deficiency anemia due to intrinsic factor deficiency: Secondary | ICD-10-CM

## 2021-06-27 DIAGNOSIS — R634 Abnormal weight loss: Secondary | ICD-10-CM | POA: Diagnosis not present

## 2021-06-27 DIAGNOSIS — D509 Iron deficiency anemia, unspecified: Secondary | ICD-10-CM

## 2021-06-27 LAB — VITAMIN D 25 HYDROXY (VIT D DEFICIENCY, FRACTURES): Vit D, 25-Hydroxy: 29.02 ng/mL — ABNORMAL LOW (ref 30–100)

## 2021-06-27 NOTE — Progress Notes (Signed)
Per MD vitamin D lab added, verified with lab, added on to previously drawn labs. Pt called and updated via VM.

## 2021-07-02 ENCOUNTER — Other Ambulatory Visit: Payer: Self-pay

## 2021-07-02 ENCOUNTER — Ambulatory Visit (HOSPITAL_BASED_OUTPATIENT_CLINIC_OR_DEPARTMENT_OTHER): Payer: Medicaid Other | Attending: Internal Medicine | Admitting: Internal Medicine

## 2021-07-02 DIAGNOSIS — R400 Somnolence: Secondary | ICD-10-CM | POA: Diagnosis not present

## 2021-07-02 DIAGNOSIS — G4719 Other hypersomnia: Secondary | ICD-10-CM | POA: Insufficient documentation

## 2021-07-04 ENCOUNTER — Inpatient Hospital Stay: Payer: Medicaid Other

## 2021-07-04 ENCOUNTER — Other Ambulatory Visit: Payer: Self-pay

## 2021-07-04 ENCOUNTER — Ambulatory Visit: Payer: Medicaid Other

## 2021-07-04 VITALS — BP 102/66 | HR 82 | Temp 98.3°F | Resp 17

## 2021-07-04 DIAGNOSIS — D510 Vitamin B12 deficiency anemia due to intrinsic factor deficiency: Secondary | ICD-10-CM | POA: Diagnosis not present

## 2021-07-04 MED ORDER — ACETAMINOPHEN 325 MG PO TABS
650.0000 mg | ORAL_TABLET | Freq: Once | ORAL | Status: AC
  Administered 2021-07-04: 15:00:00 650 mg via ORAL
  Filled 2021-07-04: qty 2

## 2021-07-04 MED ORDER — CYANOCOBALAMIN 1000 MCG/ML IJ SOLN
1000.0000 ug | Freq: Once | INTRAMUSCULAR | Status: AC
  Administered 2021-07-04: 18:00:00 1000 ug via INTRAMUSCULAR
  Filled 2021-07-04: qty 1

## 2021-07-04 MED ORDER — DIPHENHYDRAMINE HCL 25 MG PO CAPS
50.0000 mg | ORAL_CAPSULE | Freq: Once | ORAL | Status: AC
  Administered 2021-07-04: 15:00:00 50 mg via ORAL
  Filled 2021-07-04: qty 2

## 2021-07-04 MED ORDER — SODIUM CHLORIDE 0.9 % IV SOLN: Freq: Once | INTRAVENOUS | Status: AC

## 2021-07-04 MED ORDER — SODIUM CHLORIDE 0.9 % IV SOLN
300.0000 mg | Freq: Once | INTRAVENOUS | Status: AC
  Administered 2021-07-04: 16:00:00 300 mg via INTRAVENOUS
  Filled 2021-07-04: qty 300

## 2021-07-04 NOTE — Patient Instructions (Signed)

## 2021-07-11 ENCOUNTER — Encounter: Payer: Self-pay | Admitting: *Deleted

## 2021-07-12 ENCOUNTER — Other Ambulatory Visit: Payer: Self-pay

## 2021-07-12 ENCOUNTER — Inpatient Hospital Stay: Payer: Medicaid Other

## 2021-07-12 VITALS — BP 113/70 | HR 80 | Temp 98.6°F | Resp 16

## 2021-07-12 DIAGNOSIS — E538 Deficiency of other specified B group vitamins: Secondary | ICD-10-CM

## 2021-07-12 DIAGNOSIS — D510 Vitamin B12 deficiency anemia due to intrinsic factor deficiency: Secondary | ICD-10-CM | POA: Diagnosis not present

## 2021-07-12 MED ORDER — SODIUM CHLORIDE 0.9 % IV SOLN
300.0000 mg | Freq: Once | INTRAVENOUS | Status: AC
  Administered 2021-07-12: 300 mg via INTRAVENOUS
  Filled 2021-07-12: qty 300

## 2021-07-12 MED ORDER — DIPHENHYDRAMINE HCL 25 MG PO CAPS
50.0000 mg | ORAL_CAPSULE | Freq: Once | ORAL | Status: AC
  Administered 2021-07-12: 50 mg via ORAL
  Filled 2021-07-12: qty 2

## 2021-07-12 MED ORDER — ACETAMINOPHEN 325 MG PO TABS
650.0000 mg | ORAL_TABLET | Freq: Once | ORAL | Status: AC
  Administered 2021-07-12: 650 mg via ORAL
  Filled 2021-07-12: qty 2

## 2021-07-12 MED ORDER — SODIUM CHLORIDE 0.9 % IV SOLN: Freq: Once | INTRAVENOUS | Status: AC

## 2021-07-12 NOTE — Patient Instructions (Signed)

## 2021-07-12 NOTE — Progress Notes (Signed)
Pt declined to stay for 30 minute post Venofer infusion observation.  °Pt tolerated treatment well without incident. °VSS at discharge.  °Ambulatory to lobby.  °

## 2021-07-14 NOTE — Procedures (Signed)
° °  NAME: Meredith [name] DATE OF BIRTH:  01-05-1981 MEDICAL RECORD NUMBER [id number]  LOCATION: [facility]  PHYSICIAN: Deretha Emory  DATE OF STUDY: 07/02/2021  SLEEP STUDY TYPE: Nocturnal Polysomnogram               REFERRING PHYSICIAN: Deretha Emory, MD  EPWORTH SLEEPINESS SCORE:  18 HEIGHT: 5\' 2"  (157.5 cm)  WEIGHT: 162 lb (73.5 kg)    Body mass index is 29.63 kg/m.  NECK SIZE: 15.75 in.  CLINICAL INFORMATION The patient was referred to the sleep center for evaluation excessive daytime sleepiness.   MEDICATIONS No sleep medicine administered.  SLEEP STUDY TECHNIQUE A multi-channel overnight Polysomnography study was performed. The channels recorded and monitored were central and occipital EEG, electrooculogram (EOG), submentalis EMG (chin), nasal and oral airflow, thoracic and abdominal wall motion, anterior tibialis EMG, snore microphone, electrocardiogram, and a pulse oximetry.  TECHNICAL COMMENTS Comments added by Technician: Patient had difficulty initiating sleep. Comments added by Scorer: N/A  SLEEP ARCHITECTURE The study was initiated at 10:16:29 PM and terminated at 5:32:54 AM. The total recorded time was 436.4 minutes. EEG confirmed total sleep time was 376.5 minutes yielding a sleep efficiency of 86.3%%. Sleep onset after lights out was 34.0 minutes with a REM latency of 96.5 minutes. The patient spent 4.0%% of the night in stage N1 sleep, 72.2%% in stage N2 sleep, 0.0%% in stage N3 and 23.8% in REM. Wake after sleep onset (WASO) was 25.9 minutes. The Arousal Index was 11.0/hour.  RESPIRATORY PARAMETERS There were a total of 9 respiratory disturbances out of which 5 were apneas ( 2 obstructive, 0 mixed, 3 central) and 4 hypopneas. The apnea/hypopnea index (AHI) was 1.4 events/hour. The central sleep apnea index was 0.5 events/hour. The REM AHI was 2.0 events/hour and NREM AHI was 1.3 events/hour. The supine AHI was 1.5 events/hour and  the non supine AHI was 0 event/hour. The patient was supine during 98.67% of sleep. Respiratory disturbance index was 2.0/hr in REM sleep and 1.9/hr overall. Respiratory disturbances were associated with oxygen desaturation down to a nadir of 88.0% during sleep. The mean oxygen saturation during the study was 93.4%.  LEG MOVEMENT DATA The total leg movements were 0 with a resulting leg movement index of 0.0/hr . Associated arousal with leg movement index was 0.0/hr.  CARDIAC DATA The underlying cardiac rhythm was most consistent with sinus rhythm. Mean heart rate during sleep was 78.4 bpm. Additional rhythm abnormalities include None.  IMPRESSIONS - No Obstructive Sleep Apnea (OSA)  DIAGNOSIS - Excessive daytime sleepiness.   RECOMMENDATIONS - Consider PSG and MSLT since PSG was normal.   Marland Kitchen Sleep specialist, American Board of Internal Medicine  ELECTRONICALLY SIGNED ON:  07/14/2021, 7:36 PM [facility] PH: [phone]   FX: [phone] ACCREDITED BY THE AMERICAN ACADEMY OF SLEEP MEDICINE

## 2021-07-17 ENCOUNTER — Telehealth: Payer: Self-pay

## 2021-07-17 NOTE — Telephone Encounter (Signed)
Return call to pt, she is questioning why her mychart notes from 12/31 infusion RN state 'pt declined to stay for 30 minutes' as she says she was not asked if she was willing to stay and this 'looks bad'.  I agreed to have this reviewed for her but told her I personally could not change it as I was not the nurse working with her that day.  Pt verbalized understanding and thanks

## 2021-07-25 ENCOUNTER — Inpatient Hospital Stay: Payer: Medicaid Other | Attending: Hematology and Oncology

## 2021-07-25 ENCOUNTER — Ambulatory Visit: Payer: Medicaid Other

## 2021-07-25 ENCOUNTER — Other Ambulatory Visit: Payer: Self-pay

## 2021-07-25 VITALS — BP 98/65 | HR 79 | Temp 98.0°F | Resp 16

## 2021-07-25 DIAGNOSIS — E538 Deficiency of other specified B group vitamins: Secondary | ICD-10-CM

## 2021-07-25 DIAGNOSIS — D510 Vitamin B12 deficiency anemia due to intrinsic factor deficiency: Secondary | ICD-10-CM | POA: Insufficient documentation

## 2021-07-25 MED ORDER — SODIUM CHLORIDE 0.9 % IV SOLN
300.0000 mg | Freq: Once | INTRAVENOUS | Status: AC
  Administered 2021-07-25: 300 mg via INTRAVENOUS
  Filled 2021-07-25: qty 300

## 2021-07-25 MED ORDER — CYANOCOBALAMIN 1000 MCG/ML IJ SOLN
1000.0000 ug | Freq: Once | INTRAMUSCULAR | Status: AC
  Administered 2021-07-25: 1000 ug via INTRAMUSCULAR
  Filled 2021-07-25: qty 1

## 2021-07-25 MED ORDER — DIPHENHYDRAMINE HCL 25 MG PO CAPS
50.0000 mg | ORAL_CAPSULE | Freq: Once | ORAL | Status: AC
  Administered 2021-07-25: 50 mg via ORAL
  Filled 2021-07-25: qty 2

## 2021-07-25 MED ORDER — SODIUM CHLORIDE 0.9 % IV SOLN: Freq: Once | INTRAVENOUS | Status: AC

## 2021-07-25 MED ORDER — ACETAMINOPHEN 325 MG PO TABS
650.0000 mg | ORAL_TABLET | Freq: Once | ORAL | Status: AC
  Administered 2021-07-25: 650 mg via ORAL
  Filled 2021-07-25: qty 2

## 2021-07-25 NOTE — Patient Instructions (Signed)
[facility] MEDICAL [facility]COL[facility]  Discharge Instructions: Thank you for choosing [facility] to provide your oncology and hematology care.   If you have a lab appointment with the Cancer Center, please go directly to the Cancer Center and check in at the registration area.   Wear comfortable clothing and clothing appropriate for easy access to any Portacath or PICC line.   We strive to give you quality time with your provider. You may need to reschedule your appointment if you arrive late (15 or more minutes).  Arriving late affects you and other patients whose appointments are after yours.  Also, if you miss three or more appointments without notifying the office, you may be dismissed from the clinic at the providers discretion.      For prescription refill requests, have your pharmacy contact our office and allow 72 hours for refills to be completed.    Today you received the following chemotherapy and/or immunotherapy agents B12 and Venofer      To help prevent nausea and vomiting after your treatment, we encourage you to take your nausea medication as directed.  BELOW ARE SYMPTOMS THAT SHOULD BE REPORTED IMMEDIATELY: *FEVER GREATER THAN 100.4 F (38 C) OR HIGHER *CHILLS OR SWEATING *NAUSEA AND VOMITING THAT IS NOT CONTROLLED WITH YOUR NAUSEA MEDICATION *UNUSUAL SHORTNESS OF BREATH *UNUSUAL BRUISING OR BLEEDING *URINARY PROBLEMS (pain or burning when urinating, or frequent urination) *BOWEL PROBLEMS (unusual diarrhea, constipation, pain near the anus) TENDERNESS IN MOUTH AND THROAT WITH OR WITHOUT PRESENCE OF ULCERS (sore throat, sores in mouth, or a toothache) UNUSUAL RASH, SWELLING OR PAIN  UNUSUAL VAGINAL DISCHARGE OR ITCHING   Items with * indicate a potential emergency and should be followed up as soon as possible or go to the Emergency Department if any problems should occur.  Please show the CHEMOTHERAPY ALERT CARD or IMMUNOTHERAPY ALERT CARD at  check-in to the Emergency Department and triage nurse.  Should you have questions after your visit or need to cancel or reschedule your appointment, please contact [facility]  Dept: [phone]  and follow the prompts.  Office hours are 8:00 a.m. to 4:30 p.m. Monday - Friday. Please note that voicemails left after 4:00 p.m. may not be returned until the following business day.  We are closed weekends and major holidays. You have access to a nurse at all times for urgent questions. Please call the main number to the clinic Dept: [phone] and follow the prompts.   For any non-urgent questions, you may also contact your provider using MyChart. We now offer e-Visits for anyone 43 and older to request care online for non-urgent symptoms. For details visit [email or web address].   Also download the MyChart app! Go to the app store, search "MyChart", open the app, select [facility], and log in with your MyChart username and password.  Due to Covid, a mask is required upon entering the hospital/clinic. If you do not have a mask, one will be given to you upon arrival. For doctor visits, patients may have 1 support person aged 41 or older with them. For treatment visits, patients cannot have anyone with them due to current Covid guidelines and our immunocompromised population.

## 2021-08-27 ENCOUNTER — Inpatient Hospital Stay: Payer: Medicaid Other | Attending: Hematology and Oncology

## 2021-08-27 ENCOUNTER — Other Ambulatory Visit: Payer: Self-pay

## 2021-08-27 DIAGNOSIS — E538 Deficiency of other specified B group vitamins: Secondary | ICD-10-CM

## 2021-08-27 DIAGNOSIS — D510 Vitamin B12 deficiency anemia due to intrinsic factor deficiency: Secondary | ICD-10-CM | POA: Diagnosis not present

## 2021-08-27 MED ORDER — CYANOCOBALAMIN 1000 MCG/ML IJ SOLN
1000.0000 ug | Freq: Once | INTRAMUSCULAR | Status: AC
  Administered 2021-08-27: 1000 ug via INTRAMUSCULAR
  Filled 2021-08-27: qty 1

## 2021-09-22 ENCOUNTER — Other Ambulatory Visit: Payer: Self-pay | Admitting: *Deleted

## 2021-09-22 DIAGNOSIS — D510 Vitamin B12 deficiency anemia due to intrinsic factor deficiency: Secondary | ICD-10-CM

## 2021-09-23 ENCOUNTER — Other Ambulatory Visit: Payer: Self-pay

## 2021-09-23 ENCOUNTER — Other Ambulatory Visit: Payer: Self-pay | Admitting: *Deleted

## 2021-09-23 ENCOUNTER — Inpatient Hospital Stay: Payer: Medicaid Other | Attending: Hematology and Oncology

## 2021-09-23 DIAGNOSIS — D509 Iron deficiency anemia, unspecified: Secondary | ICD-10-CM

## 2021-09-23 DIAGNOSIS — R634 Abnormal weight loss: Secondary | ICD-10-CM

## 2021-09-23 DIAGNOSIS — D510 Vitamin B12 deficiency anemia due to intrinsic factor deficiency: Secondary | ICD-10-CM

## 2021-09-23 DIAGNOSIS — E559 Vitamin D deficiency, unspecified: Secondary | ICD-10-CM | POA: Diagnosis not present

## 2021-09-23 LAB — CBC WITH DIFFERENTIAL (CANCER CENTER ONLY)
Abs Immature Granulocytes: 0.02 10*3/uL (ref 0.00–0.07)
Basophils Absolute: 0.1 10*3/uL (ref 0.0–0.1)
Basophils Relative: 1 %
Eosinophils Absolute: 0.2 10*3/uL (ref 0.0–0.5)
Eosinophils Relative: 2 %
HCT: 41.1 % (ref 36.0–46.0)
Hemoglobin: 13.3 g/dL (ref 12.0–15.0)
Immature Granulocytes: 0 %
Lymphocytes Relative: 30 %
Lymphs Abs: 2.2 10*3/uL (ref 0.7–4.0)
MCH: 28.2 pg (ref 26.0–34.0)
MCHC: 32.4 g/dL (ref 30.0–36.0)
MCV: 87.3 fL (ref 80.0–100.0)
Monocytes Absolute: 0.6 10*3/uL (ref 0.1–1.0)
Monocytes Relative: 7 %
Neutro Abs: 4.4 10*3/uL (ref 1.7–7.7)
Neutrophils Relative %: 60 %
Platelet Count: 305 10*3/uL (ref 150–400)
RBC: 4.71 MIL/uL (ref 3.87–5.11)
RDW: 14.1 % (ref 11.5–15.5)
WBC Count: 7.5 10*3/uL (ref 4.0–10.5)
nRBC: 0 % (ref 0.0–0.2)

## 2021-09-23 LAB — IRON AND IRON BINDING CAPACITY (CC-WL,HP ONLY)
Iron: 54 ug/dL (ref 28–170)
Saturation Ratios: 15 % (ref 10.4–31.8)
TIBC: 360 ug/dL (ref 250–450)
UIBC: 306 ug/dL (ref 148–442)

## 2021-09-23 LAB — VITAMIN D 25 HYDROXY (VIT D DEFICIENCY, FRACTURES): Vit D, 25-Hydroxy: 16.88 ng/mL — ABNORMAL LOW (ref 30–100)

## 2021-09-23 LAB — VITAMIN B12: Vitamin B-12: 466 pg/mL (ref 180–914)

## 2021-09-23 LAB — FERRITIN: Ferritin: 36 ng/mL (ref 11–307)

## 2021-09-23 LAB — SAVE SMEAR(SSMR), FOR PROVIDER SLIDE REVIEW

## 2021-09-23 NOTE — Progress Notes (Signed)
? ?Patient Care Team: ?Livengood, Jessica J, PA-C as PCP - General (Physician Assistant) ? ?DIAGNOSIS:  ?Encounter Diagnosis  ?Name Primary?  ? Iron deficiency anemia, unspecified iron deficiency anemia type   ? ? ?CHIEF COMPLIANT: Follow-up of anemia ? ?INTERVAL HISTORY: Meredith [name] is a 41 y.o. with above-mentioned history of pernicious anemia and Vitamin D deficiency who is receiving a weekly B12 injections. She presents to the clinic today for follow-up.  She continues to feel fatigued as usual.  No difference in how she felt. ? ? ?ALLERGIES:  is allergic to morphine and related and promethazine hcl. ? ?MEDICATIONS:  ?Current Outpatient Medications  ?Medication Sig Dispense Refill  ? cetirizine (ZYRTEC) 10 MG tablet Take 10 mg by mouth as needed for allergies.    ? cyanocobalamin (,VITAMIN B-12,) 1000 MCG/ML injection Inject into the muscle every 30 (thirty) days. Take depending on my levels    ? ergocalciferol (VITAMIN D2) 1.25 MG (50000 UT) capsule Take 1 capsule (50,000 Units total) by mouth 2 (two) times a week. 24 capsule 3  ? ferrous sulfate 220 (44 Fe) MG/5ML solution Take 220 mg by mouth daily. Injection administered biweekly/weekly depending on levels.    ? levothyroxine (SYNTHROID) 50 MCG tablet Take 50 mcg by mouth daily before breakfast.    ? ?No current facility-administered medications for this visit.  ? ? ?PHYSICAL EXAMINATION: ?ECOG PERFORMANCE STATUS: 1 - Symptomatic but completely ambulatory ? ?Vitals:  ? 09/25/21 0928  ?BP: (!) 117/59  ?Pulse: 83  ?Resp: 18  ?Temp: (!) 97.3 ?F (36.3 ?C)  ?SpO2: 99%  ? ?Filed Weights  ? 09/25/21 0928  ?Weight: 166 lb 3.2 oz (75.4 kg)  ?  ? ?LABORATORY DATA:  ?I have reviewed the data as listed ?CMP Latest Ref Rng & Units 01/17/2021 07/03/2020 10/27/2019  ?Glucose 70 - 99 mg/dL 98 104(H) 97  ?BUN 6 - 20 mg/dL 11 18 11  ?Creatinine 0.44 - 1.00 mg/dL 0.82 0.82 0.79  ?Sodium 135 - 145 mmol/L 142 141 140  ?Potassium 3.5 - 5.1 mmol/L 3.7 4.0 4.1  ?Chloride  98 - 111 mmol/L 109 108 105  ?CO2 22 - 32 mmol/L 23 25 27  ?Calcium 8.9 - 10.3 mg/dL 9.1 9.2 9.1  ?Total Protein 6.5 - 8.1 g/dL 7.2 7.0 7.3  ?Total Bilirubin 0.3 - 1.2 mg/dL 0.3 0.4 0.5  ?Alkaline Phos 38 - 126 U/L 68 69 69  ?AST 15 - 41 U/L 11(L) 15 13(L)  ?ALT 0 - 44 U/L 11 17 11  ? ? ?Lab Results  ?Component Value Date  ? WBC 7.5 09/23/2021  ? HGB 13.3 09/23/2021  ? HCT 41.1 09/23/2021  ? MCV 87.3 09/23/2021  ? PLT 305 09/23/2021  ? NEUTROABS 4.4 09/23/2021  ? ? ?ASSESSMENT & PLAN:  ?Iron deficiency anemia ?Bone marrow biopsy 07/03/2020: 50 to 60% cellularity, normocellular bone marrow with trilineage hematopoiesis.  No dyspoietic or dysplastic changes noted ?Cytogenetics Neg ?Recommendation: Sublingual B12 5000 mcg twice a week ?Severe vitamin D deficiency:  50,000 units of vitamin D twice a week.  We will add vitamin D labs  ?  ?Severe and profound fatigue: She applied for disability and it was denied. ?Lab review:  ?06/25/2021: WBC 12.2, MCV 85.9, ferritin 4, iron saturation 9%, B12 506, folate > 5.9  ?09/23/2021: Hemoglobin 13.3, MCV 87, B12 466,  iron studies are normal with a ferritin of 36 ? ?Return to clinic in 3 months with labs done ahead of time and follow-up ? ? ? ?  No orders of the defined types were placed in this encounter. ? ?The patient has a good understanding of the overall plan. she agrees with it. she will call with any problems that may develop before the next visit here. ?Total time spent: 30 mins including face to face time and time spent for planning, charting and co-ordination of care ? ? Viinay K Gudena, MD ?09/25/21 ? ? ? I, Deritra Mcnairy, am acting as a scribe for Dr. Gudena  ?

## 2021-09-25 ENCOUNTER — Inpatient Hospital Stay: Payer: Medicaid Other

## 2021-09-25 ENCOUNTER — Other Ambulatory Visit: Payer: Self-pay

## 2021-09-25 ENCOUNTER — Inpatient Hospital Stay: Payer: Medicaid Other | Admitting: Hematology and Oncology

## 2021-09-25 DIAGNOSIS — D509 Iron deficiency anemia, unspecified: Secondary | ICD-10-CM

## 2021-09-25 DIAGNOSIS — D510 Vitamin B12 deficiency anemia due to intrinsic factor deficiency: Secondary | ICD-10-CM | POA: Diagnosis not present

## 2021-09-25 MED ORDER — CYANOCOBALAMIN 1000 MCG/ML IJ SOLN
1000.0000 ug | Freq: Once | INTRAMUSCULAR | Status: AC
  Administered 2021-09-25: 1000 ug via INTRAMUSCULAR
  Filled 2021-09-25: qty 1

## 2021-09-25 NOTE — Assessment & Plan Note (Signed)
Bone marrow biopsy 07/03/2020: 50 to 60% cellularity, normocellular bone marrow with trilineage hematopoiesis. ?No dyspoietic or dysplastic changes noted ?Cytogenetics Neg ?Recommendation: Sublingual B12 5000 mcg twice a week ?Severe vitamin D deficiency: ?50,000 units of vitamin D twice a week.  We will add vitamin D labs  ?? ?Severe and profound fatigue: She applied for disability and it was denied. ?Lab review:? ?06/25/2021: WBC 12.2, MCV 85.9, ferritin 4, iron saturation 9%, B12 506, folate > 5.9  ?09/23/2021: Hemoglobin 13.3, MCV 87, B12 466, iron saturation: 9%, ferritin: 4 ? ?Recommend intravenous iron therapy ?Return to clinic in 3 months with labs done ahead of time and follow-up ?

## 2021-09-26 ENCOUNTER — Ambulatory Visit: Payer: Medicaid Other | Admitting: Hematology and Oncology

## 2021-10-27 ENCOUNTER — Inpatient Hospital Stay: Payer: Medicaid Other | Attending: Hematology and Oncology

## 2021-10-27 ENCOUNTER — Other Ambulatory Visit: Payer: Self-pay

## 2021-10-27 DIAGNOSIS — D510 Vitamin B12 deficiency anemia due to intrinsic factor deficiency: Secondary | ICD-10-CM | POA: Diagnosis present

## 2021-10-27 DIAGNOSIS — E538 Deficiency of other specified B group vitamins: Secondary | ICD-10-CM

## 2021-10-27 MED ORDER — CYANOCOBALAMIN 1000 MCG/ML IJ SOLN
1000.0000 ug | Freq: Once | INTRAMUSCULAR | Status: AC
  Administered 2021-10-27: 1000 ug via INTRAMUSCULAR
  Filled 2021-10-27: qty 1

## 2021-10-27 NOTE — Patient Instructions (Signed)
Vitamin B12 Deficiency Vitamin B12 deficiency occurs when the body does not have enough of this important vitamin. The body needs this vitamin: To make red blood cells. To make DNA. This is the genetic material inside cells. To help the nerves work properly so they can carry messages from the brain to the body. Vitamin B12 deficiency can cause health problems, such as not having enough red blood cells in the blood (anemia). This can lead to nerve damage if untreated. What are the causes? This condition may be caused by: Not eating enough foods that contain vitamin B12. Not having enough stomach acid and digestive fluids to properly absorb vitamin B12 from the food that you eat. Having certain diseases that make it hard to absorb vitamin B12. These diseases include Crohn's disease, chronic pancreatitis, and cystic fibrosis. An autoimmune disorder in which the body does not make enough of a protein (intrinsic factor) within the stomach, resulting in not enough absorption of vitamin B12. Having a surgery in which part of the stomach or small intestine is removed. Taking certain medicines that make it hard for the body to absorb vitamin B12. These include: Heartburn medicines, such as antacids and proton pump inhibitors. Some medicines that are used to treat diabetes. What increases the risk? The following factors may make you more likely to develop a vitamin B12 deficiency: Being an older adult. Eating a vegetarian or vegan diet that does not include any foods that come from animals. Eating a poor diet while you are pregnant. Taking certain medicines. Having alcoholism. What are the signs or symptoms? In some cases, there are no symptoms of this condition. If the condition leads to anemia or nerve damage, various symptoms may occur, such as: Weakness. Tiredness (fatigue). Loss of appetite. Numbness or tingling in your hands and feet. Redness and burning of the tongue. Depression,  confusion, or memory problems. Trouble walking. If anemia is severe, symptoms can include: Shortness of breath. Dizziness. Rapid heart rate. How is this diagnosed? This condition may be diagnosed with a blood test to measure the level of vitamin B12 in your blood. You may also have other tests, including: A group of tests that measure certain characteristics of blood cells (complete blood count, CBC). A blood test to measure intrinsic factor. A procedure where a thin tube with a camera on the end is used to look into your stomach or intestines (endoscopy). Other tests may be needed to discover the cause of the deficiency. How is this treated? Treatment for this condition depends on the cause. This condition may be treated by: Changing your eating and drinking habits, such as: Eating more foods that contain vitamin B12. Drinking less alcohol or no alcohol. Getting vitamin B12 injections. Taking vitamin B12 supplements by mouth (orally). Your health care provider will tell you which dose is best for you. Follow these instructions at home: Eating and drinking  Include foods in your diet that come from animals and contain a lot of vitamin B12. These include: Meats and poultry. This includes beef, pork, chicken, turkey, and organ meats, such as liver. Seafood. This includes clams, rainbow trout, salmon, tuna, and haddock. Eggs. Dairy foods such as milk, yogurt, and cheese. Eat foods that have vitamin B12 added to them (are fortified), such as ready-to-eat breakfast cereals. Check the label on the package to see if a food is fortified. The items listed above may not be a complete list of foods and beverages you can eat and drink. Contact a dietitian for   more information. Alcohol use Do not drink alcohol if: Your health care provider tells you not to drink. You are pregnant, may be pregnant, or are planning to become pregnant. If you drink alcohol: Limit how much you have to: 0-1 drink a  day for women. 0-2 drinks a day for men. Know how much alcohol is in your drink. In the [place] one drink equals one 12 oz bottle of beer (355 mL), one 5 oz glass of wine (148 mL), or one 1 oz glass of hard liquor (44 mL). General instructions Get vitamin B12 injections if told to by your health care provider. Take supplements only as told by your health care provider. Follow the directions carefully. Keep all follow-up visits. This is important. Contact a health care provider if: Your symptoms come back. Your symptoms get worse or do not improve with treatment. Get help right away: You develop shortness of breath. You have a rapid heart rate. You have chest pain. You become dizzy or you faint. These symptoms may be an emergency. Get help right away. Call 911. Do not wait to see if the symptoms will go away. Do not drive yourself to the hospital. Summary Vitamin B12 deficiency occurs when the body does not have enough of this important vitamin. Common causes include not eating enough foods that contain vitamin B12, not being able to absorb vitamin B12 from the food that you eat, having a surgery in which part of the stomach or small intestine is removed, or taking certain medicines. Eat foods that have vitamin B12 in them. Treatment may include making a change in the way you eat and drink, getting vitamin B12 injections, or taking vitamin B12 supplements. This information is not intended to replace advice given to you by your health care provider. Make sure you discuss any questions you have with your health care provider. Document Revised: 02/21/2021 Document Reviewed: 02/21/2021 Elsevier Patient Education  2023 Elsevier Inc.  

## 2021-11-24 ENCOUNTER — Inpatient Hospital Stay: Payer: Medicaid Other | Attending: Hematology and Oncology

## 2021-11-24 ENCOUNTER — Other Ambulatory Visit: Payer: Self-pay

## 2021-11-24 DIAGNOSIS — D510 Vitamin B12 deficiency anemia due to intrinsic factor deficiency: Secondary | ICD-10-CM | POA: Insufficient documentation

## 2021-11-24 DIAGNOSIS — E538 Deficiency of other specified B group vitamins: Secondary | ICD-10-CM

## 2021-11-24 MED ORDER — CYANOCOBALAMIN 1000 MCG/ML IJ SOLN
1000.0000 ug | Freq: Once | INTRAMUSCULAR | Status: AC
  Administered 2021-11-24: 1000 ug via INTRAMUSCULAR
  Filled 2021-11-24: qty 1

## 2021-11-24 NOTE — Patient Instructions (Signed)
Vitamin B12 Deficiency Vitamin B12 deficiency occurs when the body does not have enough of this important vitamin. The body needs this vitamin: To make red blood cells. To make DNA. This is the genetic material inside cells. To help the nerves work properly so they can carry messages from the brain to the body. Vitamin B12 deficiency can cause health problems, such as not having enough red blood cells in the blood (anemia). This can lead to nerve damage if untreated. What are the causes? This condition may be caused by: Not eating enough foods that contain vitamin B12. Not having enough stomach acid and digestive fluids to properly absorb vitamin B12 from the food that you eat. Having certain diseases that make it hard to absorb vitamin B12. These diseases include Crohn's disease, chronic pancreatitis, and cystic fibrosis. An autoimmune disorder in which the body does not make enough of a protein (intrinsic factor) within the stomach, resulting in not enough absorption of vitamin B12. Having a surgery in which part of the stomach or small intestine is removed. Taking certain medicines that make it hard for the body to absorb vitamin B12. These include: Heartburn medicines, such as antacids and proton pump inhibitors. Some medicines that are used to treat diabetes. What increases the risk? The following factors may make you more likely to develop a vitamin B12 deficiency: Being an older adult. Eating a vegetarian or vegan diet that does not include any foods that come from animals. Eating a poor diet while you are pregnant. Taking certain medicines. Having alcoholism. What are the signs or symptoms? In some cases, there are no symptoms of this condition. If the condition leads to anemia or nerve damage, various symptoms may occur, such as: Weakness. Tiredness (fatigue). Loss of appetite. Numbness or tingling in your hands and feet. Redness and burning of the tongue. Depression,  confusion, or memory problems. Trouble walking. If anemia is severe, symptoms can include: Shortness of breath. Dizziness. Rapid heart rate. How is this diagnosed? This condition may be diagnosed with a blood test to measure the level of vitamin B12 in your blood. You may also have other tests, including: A group of tests that measure certain characteristics of blood cells (complete blood count, CBC). A blood test to measure intrinsic factor. A procedure where a thin tube with a camera on the end is used to look into your stomach or intestines (endoscopy). Other tests may be needed to discover the cause of the deficiency. How is this treated? Treatment for this condition depends on the cause. This condition may be treated by: Changing your eating and drinking habits, such as: Eating more foods that contain vitamin B12. Drinking less alcohol or no alcohol. Getting vitamin B12 injections. Taking vitamin B12 supplements by mouth (orally). Your health care provider will tell you which dose is best for you. Follow these instructions at home: Eating and drinking  Include foods in your diet that come from animals and contain a lot of vitamin B12. These include: Meats and poultry. This includes beef, pork, chicken, turkey, and organ meats, such as liver. Seafood. This includes clams, rainbow trout, salmon, tuna, and haddock. Eggs. Dairy foods such as milk, yogurt, and cheese. Eat foods that have vitamin B12 added to them (are fortified), such as ready-to-eat breakfast cereals. Check the label on the package to see if a food is fortified. The items listed above may not be a complete list of foods and beverages you can eat and drink. Contact a dietitian for   more information. Alcohol use Do not drink alcohol if: Your health care provider tells you not to drink. You are pregnant, may be pregnant, or are planning to become pregnant. If you drink alcohol: Limit how much you have to: 0-1 drink a  day for women. 0-2 drinks a day for men. Know how much alcohol is in your drink. In the [place] one drink equals one 12 oz bottle of beer (355 mL), one 5 oz glass of wine (148 mL), or one 1 oz glass of hard liquor (44 mL). General instructions Get vitamin B12 injections if told to by your health care provider. Take supplements only as told by your health care provider. Follow the directions carefully. Keep all follow-up visits. This is important. Contact a health care provider if: Your symptoms come back. Your symptoms get worse or do not improve with treatment. Get help right away: You develop shortness of breath. You have a rapid heart rate. You have chest pain. You become dizzy or you faint. These symptoms may be an emergency. Get help right away. Call 911. Do not wait to see if the symptoms will go away. Do not drive yourself to the hospital. Summary Vitamin B12 deficiency occurs when the body does not have enough of this important vitamin. Common causes include not eating enough foods that contain vitamin B12, not being able to absorb vitamin B12 from the food that you eat, having a surgery in which part of the stomach or small intestine is removed, or taking certain medicines. Eat foods that have vitamin B12 in them. Treatment may include making a change in the way you eat and drink, getting vitamin B12 injections, or taking vitamin B12 supplements. This information is not intended to replace advice given to you by your health care provider. Make sure you discuss any questions you have with your health care provider. Document Revised: 02/21/2021 Document Reviewed: 02/21/2021 Elsevier Patient Education  2023 Elsevier Inc.  

## 2021-12-10 NOTE — Progress Notes (Signed)
Patient Care Team: Etter Sjogren as PCP - General (Physician Assistant)  DIAGNOSIS:  Encounter Diagnosis  Name Primary?   Iron deficiency anemia, unspecified iron deficiency anemia type     CHIEF COMPLIANT: Follow-up of anemia  INTERVAL HISTORY: Meredith [name] is a 41 y.o. with above-mentioned history of pernicious anemia and Vitamin D deficiency who is receiving a weekly B12 injections. She presents to the clinic today for follow-up. States that she has been getting headaches making her feel nausea and dizzy. States that she is tired all the time. States that she chronic fatigue.   ALLERGIES:  is allergic to morphine and related, promethazine, and promethazine hcl.  MEDICATIONS:  Current Outpatient Medications  Medication Sig Dispense Refill   cetirizine (ZYRTEC) 10 MG tablet Take 10 mg by mouth as needed for allergies.     cyanocobalamin (,VITAMIN B-12,) 1000 MCG/ML injection Inject into the muscle every 30 (thirty) days. Take depending on my levels     ferrous sulfate 220 (44 Fe) MG/5ML solution Take 220 mg by mouth daily. Injection administered biweekly/weekly depending on levels.     levothyroxine (SYNTHROID) 50 MCG tablet Take 50 mcg by mouth daily before breakfast.     Vitamin D, Ergocalciferol, (DRISDOL) 1.25 MG (50000 UNIT) CAPS capsule TAKE 1 CAPSULE (50,000 UNITS TOTAL) BY MOUTH TWO TIMES A WEEK 24 capsule 3   No current facility-administered medications for this visit.    PHYSICAL EXAMINATION: ECOG PERFORMANCE STATUS: 1 - Symptomatic but completely ambulatory  Vitals:   12/22/21 0959  BP: (!) 110/59  Pulse: 84  Resp: 18  Temp: 97.7 F (36.5 C)   Filed Weights   12/22/21 0959  Weight: 169 lb 11.2 oz (77 kg)     LABORATORY DATA:  I have reviewed the data as listed    Latest Ref Rng & Units 01/17/2021    9:51 AM 07/03/2020    8:29 AM 10/27/2019   11:12 AM  CMP  Glucose 70 - 99 mg/dL 98  104  97   BUN 6 - 20 mg/dL _0 Creatinine 0.44 - 1.00 mg/dL 0.82  0.82  0.79   Sodium 135 - 145 mmol/L 142  141  140   Potassium 3.5 - 5.1 mmol/L 3.7  4.0  4.1   Chloride 98 - 111 mmol/L 109  108  105   CO2 22 - 32 mmol/L _1 Calcium 8.9 - 10.3 mg/dL 9.1  9.2  9.1   Total Protein 6.5 - 8.1 g/dL 7.2  7.0  7.3   Total Bilirubin 0.3 - 1.2 mg/dL 0.3  0.4  0.5   Alkaline Phos 38 - 126 U/L 68  69  69   AST 15 - 41 U/L _2 ALT 0 - 44 U/L _3 Lab Results  Component Value Date   WBC 8.2 12/19/2021   HGB 13.6 12/19/2021   HCT 41.3 12/19/2021   MCV 88.6 12/19/2021   PLT 349 12/19/2021   NEUTROABS 5.1 12/19/2021    ASSESSMENT & PLAN:  Iron deficiency anemia Bone marrow biopsy 07/03/2020: 50 to 60% cellularity, normocellular bone marrow with trilineage hematopoiesis.  No dyspoietic or dysplastic changes noted Cytogenetics Neg Recommendation: Sublingual B12 5000 mcg twice a week Severe vitamin D deficiency:  50,000 units of vitamin D twice a week.  We will add vitamin D labs  Severe and profound fatigue: She applied for disability and it was denied. Lab review:  06/25/2021: WBC 12.2, MCV 85.9, ferritin 4, iron saturation 9%, B12 506, folate > 5.9  09/23/2021: Hemoglobin 13.3, MCV 87, B12 466,  iron studies are normal with a ferritin of 36 12/19/21: Iron Sat: 7%, Vit D 25.93, Hb 13.6, Ferritin 6   IV Iron: 06/2021  I recommend IV Iron therapy  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 Could be autoimmune in nature. She cannot work or function on a day to day basis because of this condition.   Return to clinic in 3 months with labs done ahead of time and follow-up    Orders Placed This Encounter  Procedures   CBC with Differential ([place] Only)    Standing Status:   Future    Standing Expiration Date:   12/23/2022   Ferritin    Standing Status:   Future    Standing Expiration Date:   12/22/2022   Iron and Iron Binding Capacity ([facility] only)     Standing Status:   Future    Standing Expiration Date:   12/23/2022   Vitamin B12    Standing Status:   Future    Standing Expiration Date:   12/23/2022   The patient has a good understanding of the overall plan. she agrees with it. she will call with any problems that may develop before the next visit here. Total time spent: 30 mins including face to face time and time spent for planning, charting and co-ordination of care   Harriette Ohara, MD 12/22/21    I Gardiner Coins am scribing for Dr. Lindi Adie  I have reviewed the above documentation for accuracy and completeness, and I agree with the above.

## 2021-12-18 ENCOUNTER — Other Ambulatory Visit: Payer: Self-pay | Admitting: Hematology and Oncology

## 2021-12-19 ENCOUNTER — Other Ambulatory Visit: Payer: Self-pay

## 2021-12-19 ENCOUNTER — Inpatient Hospital Stay: Payer: Medicaid Other | Attending: Hematology and Oncology

## 2021-12-19 DIAGNOSIS — D510 Vitamin B12 deficiency anemia due to intrinsic factor deficiency: Secondary | ICD-10-CM | POA: Diagnosis present

## 2021-12-19 DIAGNOSIS — D509 Iron deficiency anemia, unspecified: Secondary | ICD-10-CM

## 2021-12-19 DIAGNOSIS — E559 Vitamin D deficiency, unspecified: Secondary | ICD-10-CM | POA: Diagnosis not present

## 2021-12-19 DIAGNOSIS — R634 Abnormal weight loss: Secondary | ICD-10-CM

## 2021-12-19 LAB — CBC WITH DIFFERENTIAL (CANCER CENTER ONLY)
Abs Immature Granulocytes: 0.01 10*3/uL (ref 0.00–0.07)
Basophils Absolute: 0.1 10*3/uL (ref 0.0–0.1)
Basophils Relative: 1 %
Eosinophils Absolute: 0.2 10*3/uL (ref 0.0–0.5)
Eosinophils Relative: 2 %
HCT: 41.3 % (ref 36.0–46.0)
Hemoglobin: 13.6 g/dL (ref 12.0–15.0)
Immature Granulocytes: 0 %
Lymphocytes Relative: 27 %
Lymphs Abs: 2.3 10*3/uL (ref 0.7–4.0)
MCH: 29.2 pg (ref 26.0–34.0)
MCHC: 32.9 g/dL (ref 30.0–36.0)
MCV: 88.6 fL (ref 80.0–100.0)
Monocytes Absolute: 0.6 10*3/uL (ref 0.1–1.0)
Monocytes Relative: 7 %
Neutro Abs: 5.1 10*3/uL (ref 1.7–7.7)
Neutrophils Relative %: 63 %
Platelet Count: 349 10*3/uL (ref 150–400)
RBC: 4.66 MIL/uL (ref 3.87–5.11)
RDW: 12.7 % (ref 11.5–15.5)
WBC Count: 8.2 10*3/uL (ref 4.0–10.5)
nRBC: 0 % (ref 0.0–0.2)

## 2021-12-19 LAB — IRON AND IRON BINDING CAPACITY (CC-WL,HP ONLY)
Iron: 28 ug/dL (ref 28–170)
Saturation Ratios: 7 % — ABNORMAL LOW (ref 10.4–31.8)
TIBC: 433 ug/dL (ref 250–450)
UIBC: 405 ug/dL (ref 148–442)

## 2021-12-19 LAB — VITAMIN D 25 HYDROXY (VIT D DEFICIENCY, FRACTURES): Vit D, 25-Hydroxy: 25.93 ng/mL — ABNORMAL LOW (ref 30–100)

## 2021-12-19 LAB — FERRITIN: Ferritin: 6 ng/mL — ABNORMAL LOW (ref 11–307)

## 2021-12-19 LAB — VITAMIN B12: Vitamin B-12: 558 pg/mL (ref 180–914)

## 2021-12-22 ENCOUNTER — Other Ambulatory Visit: Payer: Self-pay

## 2021-12-22 ENCOUNTER — Inpatient Hospital Stay: Payer: Medicaid Other | Admitting: Hematology and Oncology

## 2021-12-22 ENCOUNTER — Inpatient Hospital Stay: Payer: Medicaid Other

## 2021-12-22 DIAGNOSIS — D509 Iron deficiency anemia, unspecified: Secondary | ICD-10-CM

## 2021-12-22 DIAGNOSIS — E538 Deficiency of other specified B group vitamins: Secondary | ICD-10-CM

## 2021-12-22 DIAGNOSIS — D510 Vitamin B12 deficiency anemia due to intrinsic factor deficiency: Secondary | ICD-10-CM | POA: Diagnosis not present

## 2021-12-22 MED ORDER — CYANOCOBALAMIN 1000 MCG/ML IJ SOLN
1000.0000 ug | Freq: Once | INTRAMUSCULAR | Status: AC
  Administered 2021-12-22: 1000 ug via INTRAMUSCULAR
  Filled 2021-12-22: qty 1

## 2021-12-22 NOTE — Assessment & Plan Note (Addendum)
Bone marrow biopsy 07/03/2020: 50 to 60% cellularity, normocellular bone marrow with trilineage hematopoiesis. No dyspoietic or dysplastic changes noted Cytogenetics Neg Recommendation: Sublingual B12 5000 mcg twice a week Severe vitamin D deficiency: 50,000 units of vitamin D twice a week.We will add vitamin Dlabs   Severe and profound fatigue: She applied for disability and it was denied. Lab review: 06/25/2021: WBC 12.2, MCV 85.9, ferritin 4, iron saturation 9%, B12 506, folate>5.9  09/23/2021: Hemoglobin 13.3, MCV 87, B12 466,  iron studies are normal with a ferritin of 36 12/19/21: Iron Sat: 7%, Vit D 25.93, Hb 13.6, Ferritin 6   IV Iron: 06/2021  I recommend IV Iron therapy  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 Could be autoimmune in nature. She cannot work or function on a day to day basis because of this condition.  Return to clinic in 3 months with labs done ahead of time and follow-up

## 2021-12-30 ENCOUNTER — Other Ambulatory Visit: Payer: Self-pay

## 2021-12-30 ENCOUNTER — Inpatient Hospital Stay: Payer: Medicaid Other

## 2021-12-30 VITALS — BP 99/67 | HR 79 | Temp 98.5°F | Resp 17

## 2021-12-30 DIAGNOSIS — D510 Vitamin B12 deficiency anemia due to intrinsic factor deficiency: Secondary | ICD-10-CM | POA: Diagnosis not present

## 2021-12-30 DIAGNOSIS — E538 Deficiency of other specified B group vitamins: Secondary | ICD-10-CM

## 2021-12-30 MED ORDER — ACETAMINOPHEN 325 MG PO TABS
650.0000 mg | ORAL_TABLET | Freq: Once | ORAL | Status: AC
  Administered 2021-12-30: 650 mg via ORAL
  Filled 2021-12-30: qty 2

## 2021-12-30 MED ORDER — SODIUM CHLORIDE 0.9 % IV SOLN: Freq: Once | INTRAVENOUS | Status: AC

## 2021-12-30 MED ORDER — DIPHENHYDRAMINE HCL 25 MG PO CAPS
50.0000 mg | ORAL_CAPSULE | Freq: Once | ORAL | Status: AC
  Administered 2021-12-30: 25 mg via ORAL
  Filled 2021-12-30: qty 2

## 2021-12-30 MED ORDER — SODIUM CHLORIDE 0.9 % IV SOLN
300.0000 mg | Freq: Once | INTRAVENOUS | Status: AC
  Administered 2021-12-30: 300 mg via INTRAVENOUS
  Filled 2021-12-30: qty 300

## 2021-12-30 NOTE — Progress Notes (Signed)
Patient declined to stay for 30 minute post iron observation, she has to pick her child up.

## 2022-01-05 ENCOUNTER — Other Ambulatory Visit: Payer: Self-pay

## 2022-01-05 ENCOUNTER — Inpatient Hospital Stay: Payer: Medicaid Other

## 2022-01-05 VITALS — BP 107/71 | HR 78 | Temp 98.2°F | Resp 17

## 2022-01-05 DIAGNOSIS — E538 Deficiency of other specified B group vitamins: Secondary | ICD-10-CM

## 2022-01-05 DIAGNOSIS — D510 Vitamin B12 deficiency anemia due to intrinsic factor deficiency: Secondary | ICD-10-CM | POA: Diagnosis not present

## 2022-01-05 MED ORDER — SODIUM CHLORIDE 0.9 % IV SOLN
300.0000 mg | Freq: Once | INTRAVENOUS | Status: AC
  Administered 2022-01-05: 300 mg via INTRAVENOUS
  Filled 2022-01-05: qty 300

## 2022-01-05 MED ORDER — SODIUM CHLORIDE 0.9 % IV SOLN: Freq: Once | INTRAVENOUS | Status: AC

## 2022-01-05 MED ORDER — ACETAMINOPHEN 325 MG PO TABS
650.0000 mg | ORAL_TABLET | Freq: Once | ORAL | Status: AC
  Administered 2022-01-05: 650 mg via ORAL
  Filled 2022-01-05: qty 2

## 2022-01-05 MED ORDER — DIPHENHYDRAMINE HCL 25 MG PO CAPS
25.0000 mg | ORAL_CAPSULE | Freq: Once | ORAL | Status: AC
  Administered 2022-01-05: 25 mg via ORAL
  Filled 2022-01-05: qty 1

## 2022-01-09 ENCOUNTER — Telehealth: Payer: Self-pay | Admitting: Hematology and Oncology

## 2022-01-09 NOTE — Telephone Encounter (Signed)
Rescheduled appointment per [name]'s orders. Patient aware of new time.

## 2022-01-15 ENCOUNTER — Inpatient Hospital Stay: Payer: Medicaid Other | Attending: Hematology and Oncology

## 2022-01-15 VITALS — BP 99/69 | HR 77 | Temp 98.7°F | Resp 17

## 2022-01-15 DIAGNOSIS — E538 Deficiency of other specified B group vitamins: Secondary | ICD-10-CM

## 2022-01-15 DIAGNOSIS — D510 Vitamin B12 deficiency anemia due to intrinsic factor deficiency: Secondary | ICD-10-CM | POA: Diagnosis not present

## 2022-01-15 MED ORDER — CYANOCOBALAMIN 1000 MCG/ML IJ SOLN
1000.0000 ug | Freq: Once | INTRAMUSCULAR | Status: AC
  Administered 2022-01-15: 1000 ug via INTRAMUSCULAR
  Filled 2022-01-15: qty 1

## 2022-01-15 MED ORDER — SODIUM CHLORIDE 0.9 % IV SOLN
300.0000 mg | Freq: Once | INTRAVENOUS | Status: AC
  Administered 2022-01-15: 300 mg via INTRAVENOUS
  Filled 2022-01-15: qty 300

## 2022-01-15 MED ORDER — SODIUM CHLORIDE 0.9 % IV SOLN: Freq: Once | INTRAVENOUS | Status: AC

## 2022-01-15 MED ORDER — DIPHENHYDRAMINE HCL 25 MG PO CAPS
25.0000 mg | ORAL_CAPSULE | Freq: Once | ORAL | Status: AC
  Administered 2022-01-15: 25 mg via ORAL
  Filled 2022-01-15: qty 1

## 2022-01-15 MED ORDER — ACETAMINOPHEN 325 MG PO TABS
650.0000 mg | ORAL_TABLET | Freq: Once | ORAL | Status: AC
  Administered 2022-01-15: 650 mg via ORAL
  Filled 2022-01-15: qty 2

## 2022-01-15 NOTE — Progress Notes (Signed)
PT declined to stay 30 minute post observation period. VSS and no signs of distress noted at discharge.

## 2022-01-15 NOTE — Patient Instructions (Signed)

## 2022-01-15 NOTE — Progress Notes (Signed)
Per Dr. Pamelia Hoit, okay to give b12 injection while here for iron today. Injection appointment for 01/19/22 cancelled.

## 2022-01-17 ENCOUNTER — Emergency Department (HOSPITAL_COMMUNITY): Payer: Medicaid Other

## 2022-01-17 ENCOUNTER — Encounter (HOSPITAL_COMMUNITY): Payer: Self-pay

## 2022-01-17 ENCOUNTER — Emergency Department (HOSPITAL_BASED_OUTPATIENT_CLINIC_OR_DEPARTMENT_OTHER)
Admit: 2022-01-17 | Discharge: 2022-01-17 | Disposition: A | Discharge status: Home / Self Care | Payer: Medicaid Other | Attending: Emergency Medicine | Admitting: Emergency Medicine

## 2022-01-17 ENCOUNTER — Emergency Department (HOSPITAL_COMMUNITY)
Admission: EM | Admit: 2022-01-17 | Discharge: 2022-01-17 | Disposition: A | Discharge status: Home / Self Care | Payer: Medicaid Other | Attending: Emergency Medicine | Admitting: Emergency Medicine

## 2022-01-17 ENCOUNTER — Other Ambulatory Visit: Payer: Self-pay

## 2022-01-17 DIAGNOSIS — M79604 Pain in right leg: Secondary | ICD-10-CM

## 2022-01-17 DIAGNOSIS — M79609 Pain in unspecified limb: Secondary | ICD-10-CM

## 2022-01-17 DIAGNOSIS — M25561 Pain in right knee: Secondary | ICD-10-CM | POA: Diagnosis not present

## 2022-01-17 MED ORDER — ACETAMINOPHEN 500 MG PO TABS
1000.0000 mg | ORAL_TABLET | Freq: Once | ORAL | Status: AC
  Administered 2022-01-17: 1000 mg via ORAL
  Filled 2022-01-17: qty 2

## 2022-01-17 NOTE — Progress Notes (Signed)
Right lower extremity venous duplex completed. Refer to "CV Proc" under chart review to view preliminary results.  01/17/2022 1:26 PM Eula Fried., MHA, RVT, RDCS, RDMS

## 2022-01-17 NOTE — ED Provider Triage Note (Signed)
Emergency Medicine Provider Triage Evaluation Note  Meredith [name] , a 41 y.o. female  was evaluated in triage.  Pt complains of right leg pain x several days. Hx of iron and B12 deficiency anemia, gets infusions at the cancer center. Also has chronic fatigue, states she is very sedentary. Has been having aching behind her right knee that got significantly worse last night, waking her up from sleep. Thought it could be related to Vitamin d deficiency, but this pain is worse than she's had before. No recent injury. No hx of blood clots.   Review of Systems  Positive: Right leg pain Negative: CP, SOB, fever  Physical Exam  BP 132/76 (BP Location: Left Arm)   Pulse 89   Temp 98 F (36.7 C) (Oral)   Resp 18   SpO2 96%  Gen:   Awake, no distress   Resp:  Normal effort  MSK:   Moves extremities without difficulty  Other:    Medical Decision Making  Medically screening exam initiated at 10:47 AM.  Appropriate orders placed.  Steffani D Meily Glowacki was informed that the remainder of the evaluation will be completed by another provider, this initial triage assessment does not replace that evaluation, and the importance of remaining in the ED until their evaluation is complete.  Will obtain DVT ultrasound   Arless Vineyard T, PA-C 01/17/22 1048

## 2022-01-17 NOTE — ED Notes (Signed)
Pt has discharge orders and does not want to wait for her AVS and discharge instructions. Pt A&Ox4, ambulatory.

## 2022-01-17 NOTE — ED Provider Notes (Signed)
[facility]-EMERGENCY DEPT Provider Note   CSN: [id number] Arrival date & time: 01/17/22  1009     History  Chief Complaint  Patient presents with   Right Leg Pain    Meredith [name] is a 41 y.o. female who presents the emergency department complaining of right leg pain since last night. Hx of iron and B12 deficiency anemia, gets infusions at the cancer center. Also has chronic fatigue, states she is very sedentary. Has been having aching behind her right knee, waking her up from sleep. Has had similar pain in her right shin in the past that she attributed to her vitamin D deficiency. Has previously seen Dr Penni Bombard [facility]. No recent injury. No hx of blood clots.   HPI     Home Medications Prior to Admission medications   Medication Sig Start Date End Date Taking? Authorizing Provider  cetirizine (ZYRTEC) 10 MG tablet Take 10 mg by mouth as needed for allergies.    [provider]  cyanocobalamin (,VITAMIN B-12,) 1000 MCG/ML injection Inject into the muscle every 30 (thirty) days. Take depending on my levels    [provider]  ferrous sulfate 220 (44 Fe) MG/5ML solution Take 220 mg by mouth daily. Injection administered biweekly/weekly depending on levels.    [provider]  levothyroxine (SYNTHROID) 50 MCG tablet Take 50 mcg by mouth daily before breakfast.    [provider]  Vitamin D, Ergocalciferol, (DRISDOL) 1.25 MG (50000 UNIT) CAPS capsule TAKE 1 CAPSULE (50,000 UNITS TOTAL) BY MOUTH TWO TIMES A WEEK 12/18/21   Serena Croissant, MD      Allergies    Morphine and related, Promethazine, and Promethazine hcl    Review of Systems   Review of Systems  Constitutional:  Negative for fever.  Musculoskeletal:  Positive for arthralgias.  Skin:  Negative for wound.  All other systems reviewed and are negative.   Physical Exam Updated Vital Signs BP 129/84   Pulse 77   Temp 99 F (37.2 C) (Oral)   Resp 18    LMP 01/09/2022   SpO2 97%  Physical Exam Vitals and nursing note reviewed.  Constitutional:      Appearance: Normal appearance.  HENT:     Head: Normocephalic and atraumatic.  Eyes:     Conjunctiva/sclera: Conjunctivae normal.  Cardiovascular:     Pulses:          Dorsalis pedis pulses are 2+ on the right side and 2+ on the left side.       Posterior tibial pulses are 2+ on the right side and 2+ on the left side.  Pulmonary:     Effort: Pulmonary effort is normal. No respiratory distress.  Musculoskeletal:     Comments: No deformities palpated to the entirety of the right leg.  No effusion.  No joint increased warmth or tenderness.  No calf tenderness or swelling.  Skin:    General: Skin is warm and dry.     Comments: No wounds or overlying skin changes to the bilateral legs  Neurological:     Mental Status: She is alert.  Psychiatric:        Mood and Affect: Mood normal.        Behavior: Behavior normal.     ED Results / Procedures / Treatments   Labs (all labs ordered are listed, but only abnormal results are displayed) Labs Reviewed - No data to display  EKG None  Radiology DG Knee Complete 4 Views  Right  Result Date: 01/17/2022 CLINICAL DATA:  Traumatic posterior right knee pain. EXAM: RIGHT KNEE - COMPLETE 4+ VIEW COMPARISON:  None Available. FINDINGS: No evidence of fracture, dislocation, or joint effusion. No evidence of arthropathy or other focal bone abnormality. Soft tissues are unremarkable. IMPRESSION: Negative. Electronically Signed   By: Ted Mcalpine M.D.   On: 01/17/2022 16:28   VAS [place] LOWER EXTREMITY VENOUS (DVT) (7a-7p)  Result Date: 01/17/2022  Lower Venous DVT Study Patient Name:  Meredith [name]  Date of Exam:   01/17/2022 Medical Rec #: [id number]               Accession #:    [id number] Date of Birth: October 22, 1980               Patient Gender: F Patient Age:   29 years Exam Location:  [facility] Procedure:      VAS [place] LOWER EXTREMITY  VENOUS (DVT) Referring Phys: Griffin Basil Edda Orea --------------------------------------------------------------------------------  Indications: Right lower extremity pain.  Risk Factors: Receives iron infusions. Comparison Study: No prior study Performing Technologist: Gertie Fey MHA, RDMS, RVT, RDCS  Examination Guidelines: A complete evaluation includes B-mode imaging, spectral Doppler, color Doppler, and power Doppler as needed of all accessible portions of each vessel. Bilateral testing is considered an integral part of a complete examination. Limited examinations for reoccurring indications may be performed as noted. The reflux portion of the exam is performed with the patient in reverse Trendelenburg.  +---------+---------------+---------+-----------+----------+--------------+ RIGHT    CompressibilityPhasicitySpontaneityPropertiesThrombus Aging +---------+---------------+---------+-----------+----------+--------------+ CFV      Full           Yes      Yes                                 +---------+---------------+---------+-----------+----------+--------------+ SFJ      Full                                                        +---------+---------------+---------+-----------+----------+--------------+ FV Prox  Full                                                        +---------+---------------+---------+-----------+----------+--------------+ FV Mid   Full                                                        +---------+---------------+---------+-----------+----------+--------------+ FV DistalFull                                                        +---------+---------------+---------+-----------+----------+--------------+ PFV      Full                                                        +---------+---------------+---------+-----------+----------+--------------+  POP      Full           Yes      Yes                                  +---------+---------------+---------+-----------+----------+--------------+ PTV      Full                                                        +---------+---------------+---------+-----------+----------+--------------+ PERO     Full                                                        +---------+---------------+---------+-----------+----------+--------------+   +----+---------------+---------+-----------+----------+--------------+ LEFTCompressibilityPhasicitySpontaneityPropertiesThrombus Aging +----+---------------+---------+-----------+----------+--------------+ CFV Full           Yes      Yes                                 +----+---------------+---------+-----------+----------+--------------+    Summary: RIGHT: - There is no evidence of deep vein thrombosis in the lower extremity.  - No cystic structure found in the popliteal fossa.  LEFT: - No evidence of common femoral vein obstruction.  *See table(s) above for measurements and observations.    Preliminary     Procedures Procedures    Medications Ordered in ED Medications  acetaminophen (TYLENOL) tablet 1,000 mg (1,000 mg Oral Given 01/17/22 1047)    ED Course/ Medical Decision Making/ A&P                           Medical Decision Making Amount and/or Complexity of Data Reviewed Radiology: ordered.  Risk OTC drugs.   Patient is a 41 year old female with history of B12 and iron deficiency anemia, chronic fatigue, and vitamin D deficiency who presents the emergency department complaining of right leg pain since last night. Reports she is very sedentary.  No history of blood clots.  No recent illness or injury.  On exam patient has no focal tenderness to the right knee or right leg, or calf tenderness.  Neurovascularly intact in bilateral lower extremities. Normal vital signs. No skin changes or cellulitic findings.   I personally ordered the following images: DVT study and right knee x-ray.  Ultrasound  showed no evidence of DVT, and x-ray showed no acute abnormalities.   Discussed the results with the patient. Exam and imaging does not demonstrate emergent pathology for her knee pain. Will discharge to home and encourage symptomatic management and have her follow up with her orthopedist. Patient is agreeable to plan and all questions answered.   Final Clinical Impression(s) / ED Diagnoses Final diagnoses:  Acute pain of right knee    Rx / DC Orders ED Discharge Orders     None      Portions of this report may have been transcribed using voice recognition software. Every effort was made to ensure accuracy; however, inadvertent computerized transcription errors may be present.    Chelbie Jarnagin T, PA-C 01/17/22 1724  Gwyneth Sprout, MD 01/18/22 [phone]

## 2022-01-17 NOTE — ED Triage Notes (Signed)
Pt c/o right leg pain that is centered behind her knee. Pt here for rule out DVT. Pt states she has weekly infusions at the [facility] for vitamin deficiencies.

## 2022-01-17 NOTE — Discharge Instructions (Addendum)
You were seen in the emergency department for knee/leg pain.  As we discussed, your ultrasound did not show evidence of a blood clot and your x-ray did not show any arthritis or bone abnormalities.   I think you would benefit from following up with the orthopedist to discuss the need for any further imaging. In the mean time you can take over the counter medications like ibuprofen or tylenol. You can use ice or a heating pad, whatever feels better.   Continue to monitor how you're doing and return to the ER for new or worsening symptoms.

## 2022-01-19 ENCOUNTER — Inpatient Hospital Stay: Payer: Medicaid Other

## 2022-02-17 ENCOUNTER — Other Ambulatory Visit: Payer: Self-pay | Admitting: Nurse Practitioner

## 2022-02-17 DIAGNOSIS — Z1231 Encounter for screening mammogram for malignant neoplasm of breast: Secondary | ICD-10-CM

## 2022-02-23 ENCOUNTER — Inpatient Hospital Stay: Payer: Medicaid Other | Attending: Hematology and Oncology

## 2022-02-23 ENCOUNTER — Other Ambulatory Visit: Payer: Self-pay

## 2022-02-23 DIAGNOSIS — E538 Deficiency of other specified B group vitamins: Secondary | ICD-10-CM

## 2022-02-23 DIAGNOSIS — D510 Vitamin B12 deficiency anemia due to intrinsic factor deficiency: Secondary | ICD-10-CM | POA: Diagnosis present

## 2022-02-23 MED ORDER — CYANOCOBALAMIN 1000 MCG/ML IJ SOLN
1000.0000 ug | Freq: Once | INTRAMUSCULAR | Status: AC
  Administered 2022-02-23: 1000 ug via INTRAMUSCULAR
  Filled 2022-02-23: qty 1

## 2022-03-11 ENCOUNTER — Ambulatory Visit
Admission: RE | Admit: 2022-03-11 | Discharge: 2022-03-11 | Disposition: A | Discharge status: Home / Self Care | Payer: Medicaid Other | Source: Ambulatory Visit | Attending: Nurse Practitioner | Admitting: Nurse Practitioner

## 2022-03-11 DIAGNOSIS — Z1231 Encounter for screening mammogram for malignant neoplasm of breast: Secondary | ICD-10-CM

## 2022-03-20 ENCOUNTER — Inpatient Hospital Stay: Payer: Medicaid Other | Attending: Hematology and Oncology

## 2022-03-20 ENCOUNTER — Other Ambulatory Visit: Payer: Self-pay

## 2022-03-20 DIAGNOSIS — D509 Iron deficiency anemia, unspecified: Secondary | ICD-10-CM | POA: Insufficient documentation

## 2022-03-20 DIAGNOSIS — D510 Vitamin B12 deficiency anemia due to intrinsic factor deficiency: Secondary | ICD-10-CM

## 2022-03-20 DIAGNOSIS — R634 Abnormal weight loss: Secondary | ICD-10-CM

## 2022-03-20 DIAGNOSIS — E559 Vitamin D deficiency, unspecified: Secondary | ICD-10-CM | POA: Diagnosis not present

## 2022-03-20 LAB — CBC WITH DIFFERENTIAL (CANCER CENTER ONLY)
Abs Immature Granulocytes: 0.01 10*3/uL (ref 0.00–0.07)
Basophils Absolute: 0 10*3/uL (ref 0.0–0.1)
Basophils Relative: 1 %
Eosinophils Absolute: 0 10*3/uL (ref 0.0–0.5)
Eosinophils Relative: 1 %
HCT: 37.3 % (ref 36.0–46.0)
Hemoglobin: 12.8 g/dL (ref 12.0–15.0)
Immature Granulocytes: 0 %
Lymphocytes Relative: 12 %
Lymphs Abs: 0.5 10*3/uL — ABNORMAL LOW (ref 0.7–4.0)
MCH: 30 pg (ref 26.0–34.0)
MCHC: 34.3 g/dL (ref 30.0–36.0)
MCV: 87.4 fL (ref 80.0–100.0)
Monocytes Absolute: 0.7 10*3/uL (ref 0.1–1.0)
Monocytes Relative: 16 %
Neutro Abs: 3 10*3/uL (ref 1.7–7.7)
Neutrophils Relative %: 70 %
Platelet Count: 245 10*3/uL (ref 150–400)
RBC: 4.27 MIL/uL (ref 3.87–5.11)
RDW: 13.4 % (ref 11.5–15.5)
WBC Count: 4.2 10*3/uL (ref 4.0–10.5)
nRBC: 0 % (ref 0.0–0.2)

## 2022-03-20 LAB — IRON AND IRON BINDING CAPACITY (CC-WL,HP ONLY)
Iron: 20 ug/dL — ABNORMAL LOW (ref 28–170)
Saturation Ratios: 6 % — ABNORMAL LOW (ref 10.4–31.8)
TIBC: 318 ug/dL (ref 250–450)
UIBC: 298 ug/dL (ref 148–442)

## 2022-03-20 LAB — VITAMIN B12: Vitamin B-12: 383 pg/mL (ref 180–914)

## 2022-03-20 LAB — VITAMIN D 25 HYDROXY (VIT D DEFICIENCY, FRACTURES): Vit D, 25-Hydroxy: 29.35 ng/mL — ABNORMAL LOW (ref 30–100)

## 2022-03-20 LAB — FERRITIN: Ferritin: 52 ng/mL (ref 11–307)

## 2022-03-23 ENCOUNTER — Inpatient Hospital Stay (HOSPITAL_BASED_OUTPATIENT_CLINIC_OR_DEPARTMENT_OTHER): Payer: Medicaid Other | Admitting: Hematology and Oncology

## 2022-03-23 ENCOUNTER — Inpatient Hospital Stay: Payer: Medicaid Other

## 2022-03-23 ENCOUNTER — Inpatient Hospital Stay: Payer: Medicaid Other | Admitting: Hematology and Oncology

## 2022-03-23 DIAGNOSIS — E538 Deficiency of other specified B group vitamins: Secondary | ICD-10-CM

## 2022-03-23 DIAGNOSIS — D509 Iron deficiency anemia, unspecified: Secondary | ICD-10-CM

## 2022-03-23 NOTE — Assessment & Plan Note (Signed)
Bone marrow biopsy 07/03/2020: 50 to 60% cellularity, normocellular bone marrow with trilineage hematopoiesis. No dyspoietic or dysplastic changes noted Cytogenetics Neg Recommendation: Sublingual B12 5000 mcg twice a week Severe vitamin D deficiency: 50,000 units of vitamin D twice a week.We will add vitamin Dlabs   Severe and profound fatigue: She applied for disability and it was denied. Lab review: 06/25/2021: WBC 12.2, MCV 85.9, ferritin 4, iron saturation 9%, B12 506, folate>5.9 09/23/2021: Hemoglobin 13.3, MCV 87, B12 466,iron studies are normal with a ferritin of 36 12/19/21: Iron Sat: 7%, Vit D 25.93, Hb 13.6, Ferritin 6  03/20/2022: Hemoglobin 12.8, MCV 87.4, B12 383, iron saturation 6%, ferritin 52  IV Iron: 06/2021, June 2023  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 Could be autoimmune in nature. She cannot work or function on a day to day basis because of this condition.

## 2022-03-23 NOTE — Progress Notes (Signed)
HEMATOLOGY-ONCOLOGY TELEPHONE VISIT PROGRESS NOTE  I connected with our patient on 03/23/22 at  9:15 AM EDT by telephone and verified that I am speaking with the correct person using two identifiers.  I discussed the limitations, risks, security and privacy concerns of performing an evaluation and management service by telephone and the availability of in person appointments.  I also discussed with the patient that there may be a patient responsible charge related to this service. The patient expressed understanding and agreed to proceed.   History of Present Illness: Recent diagnosis with Covid. Continues to be tired.  REVIEW OF SYSTEMS:   Constitutional: Denies fevers, chills or abnormal weight loss All other systems were reviewed with the patient and are negative. Observations/Objective:     Assessment Plan:  Iron deficiency anemia Bone marrow biopsy 07/03/2020: 50 to 60% cellularity, normocellular bone marrow with trilineage hematopoiesis.  No dyspoietic or dysplastic changes noted Cytogenetics Neg Recommendation: Sublingual B12 5000 mcg twice a week Severe vitamin D deficiency:  50,000 units of vitamin D twice a week.  Vitamin D levels continue to be low   Severe and profound fatigue: She applied for disability and it was denied. Lab review:  06/25/2021: WBC 12.2, MCV 85.9, ferritin 4, iron saturation 9%, B12 506, folate > 5.9  09/23/2021: Hemoglobin 13.3, MCV 87, B12 466,  iron studies are normal with a ferritin of 36 12/19/21: Iron Sat: 7%, Vit D 25.93, Hb 13.6, Ferritin 6  03/20/2022: Hemoglobin 12.8, MCV 87.4, B12 383, iron saturation 6%, ferritin 52   IV Iron: 06/2021, June 2023  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 Could be autoimmune in nature. She cannot work or function on a day to day basis because of this condition.  COVID-19 infection: She was diagnosed with this couple of days ago and she is still recovering.  She is currently on  Paxlovid.  This is probably the reason why her lymphocyte count is low.  Return to clinic in 3 months with labs.  I discussed the assessment and treatment plan with the patient. The patient was provided an opportunity to ask questions and all were answered. The patient agreed with the plan and demonstrated an understanding of the instructions. The patient was advised to call back or seek an in-person evaluation if the symptoms worsen or if the condition fails to improve as anticipated.   I provided 12 minutes of non-face-to-face time during this encounter.  This includes time for charting and coordination of care   Harriette Ohara, MD

## 2022-03-23 NOTE — Assessment & Plan Note (Deleted)
Bone marrow biopsy 07/03/2020: 50 to 60% cellularity, normocellular bone marrow with trilineage hematopoiesis. No dyspoietic or dysplastic changes noted Cytogenetics Neg Recommendation: Sublingual B12 5000 mcg twice a week Severe vitamin D deficiency: 50,000 units of vitamin D twice a week.We will add vitamin Dlabs   Severe and profound fatigue: She applied for disability and it was denied. Lab review: 06/25/2021: WBC 12.2, MCV 85.9, ferritin 4, iron saturation 9%, B12 506, folate>5.9 09/23/2021: Hemoglobin 13.3, MCV 87, B12 466,iron studies are normal with a ferritin of 36 12/19/21: Iron Sat: 7%, Vit D 25.93, Hb 13.6, Ferritin 6  03/20/2022: Hemoglobin 12.8, MCV 87.4, B12 383, iron saturation 6%, ferritin 52  IV Iron: 06/2021, June 2023  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 Could be autoimmune in nature. She cannot work or function on a day to day basis because of this condition.

## 2022-03-25 ENCOUNTER — Telehealth: Payer: Self-pay | Admitting: Hematology and Oncology

## 2022-03-25 NOTE — Telephone Encounter (Signed)
Scheduled appointment per 9/11 los. Left voicemail. 

## 2022-04-10 ENCOUNTER — Other Ambulatory Visit: Payer: Self-pay | Admitting: *Deleted

## 2022-04-10 DIAGNOSIS — E538 Deficiency of other specified B group vitamins: Secondary | ICD-10-CM

## 2022-04-13 ENCOUNTER — Inpatient Hospital Stay (HOSPITAL_BASED_OUTPATIENT_CLINIC_OR_DEPARTMENT_OTHER): Payer: Medicaid Other

## 2022-04-13 ENCOUNTER — Inpatient Hospital Stay: Payer: Medicaid Other

## 2022-04-13 ENCOUNTER — Other Ambulatory Visit: Payer: Self-pay

## 2022-04-13 ENCOUNTER — Inpatient Hospital Stay: Payer: Medicaid Other | Attending: Hematology and Oncology

## 2022-04-13 DIAGNOSIS — R634 Abnormal weight loss: Secondary | ICD-10-CM

## 2022-04-13 DIAGNOSIS — D509 Iron deficiency anemia, unspecified: Secondary | ICD-10-CM

## 2022-04-13 DIAGNOSIS — E538 Deficiency of other specified B group vitamins: Secondary | ICD-10-CM | POA: Diagnosis present

## 2022-04-13 DIAGNOSIS — D510 Vitamin B12 deficiency anemia due to intrinsic factor deficiency: Secondary | ICD-10-CM

## 2022-04-13 LAB — CBC WITH DIFFERENTIAL (CANCER CENTER ONLY)
Abs Immature Granulocytes: 0.03 10*3/uL (ref 0.00–0.07)
Basophils Absolute: 0.1 10*3/uL (ref 0.0–0.1)
Basophils Relative: 1 %
Eosinophils Absolute: 0.2 10*3/uL (ref 0.0–0.5)
Eosinophils Relative: 2 %
HCT: 38.5 % (ref 36.0–46.0)
Hemoglobin: 13.2 g/dL (ref 12.0–15.0)
Immature Granulocytes: 0 %
Lymphocytes Relative: 27 %
Lymphs Abs: 2.3 10*3/uL (ref 0.7–4.0)
MCH: 29.9 pg (ref 26.0–34.0)
MCHC: 34.3 g/dL (ref 30.0–36.0)
MCV: 87.1 fL (ref 80.0–100.0)
Monocytes Absolute: 0.7 10*3/uL (ref 0.1–1.0)
Monocytes Relative: 8 %
Neutro Abs: 5.4 10*3/uL (ref 1.7–7.7)
Neutrophils Relative %: 62 %
Platelet Count: 320 10*3/uL (ref 150–400)
RBC: 4.42 MIL/uL (ref 3.87–5.11)
RDW: 13.2 % (ref 11.5–15.5)
WBC Count: 8.6 10*3/uL (ref 4.0–10.5)
nRBC: 0 % (ref 0.0–0.2)

## 2022-04-13 LAB — CMP (CANCER CENTER ONLY)
ALT: 12 U/L (ref 0–44)
AST: 10 U/L — ABNORMAL LOW (ref 15–41)
Albumin: 4.3 g/dL (ref 3.5–5.0)
Alkaline Phosphatase: 68 U/L (ref 38–126)
Anion gap: 6 (ref 5–15)
BUN: 9 mg/dL (ref 6–20)
CO2: 26 mmol/L (ref 22–32)
Calcium: 9 mg/dL (ref 8.9–10.3)
Chloride: 109 mmol/L (ref 98–111)
Creatinine: 0.72 mg/dL (ref 0.44–1.00)
GFR, Estimated: >60 mL/min (ref >60)
Glucose, Bld: 92 mg/dL (ref 70–99)
Potassium: 3.7 mmol/L (ref 3.5–5.1)
Sodium: 141 mmol/L (ref 135–145)
Total Bilirubin: 0.3 mg/dL (ref 0.3–1.2)
Total Protein: 6.9 g/dL (ref 6.5–8.1)

## 2022-04-13 LAB — IRON AND IRON BINDING CAPACITY (CC-WL,HP ONLY)
Iron: 44 ug/dL (ref 28–170)
Saturation Ratios: 13 % (ref 10.4–31.8)
TIBC: 347 ug/dL (ref 250–450)
UIBC: 303 ug/dL (ref 148–442)

## 2022-04-13 LAB — FOLATE: Folate: 8.8 ng/mL (ref >5.9)

## 2022-04-13 LAB — VITAMIN D 25 HYDROXY (VIT D DEFICIENCY, FRACTURES): Vit D, 25-Hydroxy: 25.7 ng/mL — ABNORMAL LOW (ref 30–100)

## 2022-04-13 LAB — VITAMIN B12: Vitamin B-12: 380 pg/mL (ref 180–914)

## 2022-04-13 LAB — FERRITIN: Ferritin: 37 ng/mL (ref 11–307)

## 2022-04-13 MED ORDER — CYANOCOBALAMIN 1000 MCG/ML IJ SOLN
1000.0000 ug | Freq: Once | INTRAMUSCULAR | Status: AC
  Administered 2022-04-13: 1000 ug via INTRAMUSCULAR
  Filled 2022-04-13: qty 1

## 2022-05-11 ENCOUNTER — Other Ambulatory Visit: Payer: Self-pay

## 2022-05-11 ENCOUNTER — Inpatient Hospital Stay: Payer: Medicaid Other

## 2022-05-11 ENCOUNTER — Other Ambulatory Visit: Payer: Self-pay | Admitting: Hematology and Oncology

## 2022-05-11 DIAGNOSIS — E538 Deficiency of other specified B group vitamins: Secondary | ICD-10-CM | POA: Diagnosis not present

## 2022-05-11 MED ORDER — CYANOCOBALAMIN 1000 MCG/ML IJ SOLN
1000.0000 ug | Freq: Once | INTRAMUSCULAR | Status: AC
  Administered 2022-05-11: 1000 ug via INTRAMUSCULAR
  Filled 2022-05-11: qty 1

## 2022-05-11 NOTE — Patient Instructions (Signed)

## 2022-06-08 ENCOUNTER — Telehealth: Payer: Self-pay | Admitting: Hematology and Oncology

## 2022-06-08 ENCOUNTER — Inpatient Hospital Stay: Payer: Medicaid Other | Attending: Hematology and Oncology

## 2022-06-08 ENCOUNTER — Other Ambulatory Visit: Payer: Self-pay

## 2022-06-08 DIAGNOSIS — G9332 Myalgic encephalomyelitis/chronic fatigue syndrome: Secondary | ICD-10-CM | POA: Insufficient documentation

## 2022-06-08 DIAGNOSIS — E538 Deficiency of other specified B group vitamins: Secondary | ICD-10-CM | POA: Insufficient documentation

## 2022-06-08 MED ORDER — CYANOCOBALAMIN 1000 MCG/ML IJ SOLN
1000.0000 ug | Freq: Once | INTRAMUSCULAR | Status: AC
  Administered 2022-06-08: 1000 ug via INTRAMUSCULAR
  Filled 2022-06-08: qty 1

## 2022-06-09 ENCOUNTER — Other Ambulatory Visit: Payer: Self-pay | Admitting: Family Medicine

## 2022-06-09 DIAGNOSIS — E559 Vitamin D deficiency, unspecified: Secondary | ICD-10-CM

## 2022-07-03 ENCOUNTER — Other Ambulatory Visit: Payer: Self-pay | Admitting: *Deleted

## 2022-07-03 DIAGNOSIS — E538 Deficiency of other specified B group vitamins: Secondary | ICD-10-CM

## 2022-07-07 ENCOUNTER — Inpatient Hospital Stay: Payer: Medicaid Other | Attending: Hematology and Oncology

## 2022-07-07 ENCOUNTER — Other Ambulatory Visit: Payer: Self-pay

## 2022-07-07 ENCOUNTER — Inpatient Hospital Stay: Payer: Medicaid Other

## 2022-07-07 DIAGNOSIS — D509 Iron deficiency anemia, unspecified: Secondary | ICD-10-CM | POA: Insufficient documentation

## 2022-07-07 DIAGNOSIS — E538 Deficiency of other specified B group vitamins: Secondary | ICD-10-CM

## 2022-07-07 DIAGNOSIS — E559 Vitamin D deficiency, unspecified: Secondary | ICD-10-CM | POA: Diagnosis not present

## 2022-07-07 DIAGNOSIS — G9332 Myalgic encephalomyelitis/chronic fatigue syndrome: Secondary | ICD-10-CM | POA: Diagnosis not present

## 2022-07-07 LAB — CBC WITH DIFFERENTIAL (CANCER CENTER ONLY)
Abs Immature Granulocytes: 0.02 10*3/uL (ref 0.00–0.07)
Basophils Absolute: 0.1 10*3/uL (ref 0.0–0.1)
Basophils Relative: 1 %
Eosinophils Absolute: 0.2 10*3/uL (ref 0.0–0.5)
Eosinophils Relative: 2 %
HCT: 38.8 % (ref 36.0–46.0)
Hemoglobin: 13.2 g/dL (ref 12.0–15.0)
Immature Granulocytes: 0 %
Lymphocytes Relative: 24 %
Lymphs Abs: 2 10*3/uL (ref 0.7–4.0)
MCH: 29.5 pg (ref 26.0–34.0)
MCHC: 34 g/dL (ref 30.0–36.0)
MCV: 86.6 fL (ref 80.0–100.0)
Monocytes Absolute: 0.6 10*3/uL (ref 0.1–1.0)
Monocytes Relative: 7 %
Neutro Abs: 5.4 10*3/uL (ref 1.7–7.7)
Neutrophils Relative %: 66 %
Platelet Count: 305 10*3/uL (ref 150–400)
RBC: 4.48 MIL/uL (ref 3.87–5.11)
RDW: 12.5 % (ref 11.5–15.5)
WBC Count: 8.3 10*3/uL (ref 4.0–10.5)
nRBC: 0 % (ref 0.0–0.2)

## 2022-07-07 LAB — IRON AND IRON BINDING CAPACITY (CC-WL,HP ONLY)
Iron: 29 ug/dL (ref 28–170)
Saturation Ratios: 8 % — ABNORMAL LOW (ref 10.4–31.8)
TIBC: 379 ug/dL (ref 250–450)
UIBC: 350 ug/dL (ref 148–442)

## 2022-07-07 LAB — VITAMIN D 25 HYDROXY (VIT D DEFICIENCY, FRACTURES): Vit D, 25-Hydroxy: 23.51 ng/mL — ABNORMAL LOW (ref 30–100)

## 2022-07-07 LAB — FERRITIN: Ferritin: 6 ng/mL — ABNORMAL LOW (ref 11–307)

## 2022-07-07 LAB — VITAMIN B12: Vitamin B-12: 422 pg/mL (ref 180–914)

## 2022-07-07 MED ORDER — CYANOCOBALAMIN 1000 MCG/ML IJ SOLN
1000.0000 ug | Freq: Once | INTRAMUSCULAR | Status: AC
  Administered 2022-07-07: 1000 ug via INTRAMUSCULAR
  Filled 2022-07-07: qty 1

## 2022-07-07 NOTE — Patient Instructions (Signed)

## 2022-07-09 ENCOUNTER — Inpatient Hospital Stay: Payer: Medicaid Other | Admitting: Hematology and Oncology

## 2022-07-13 NOTE — Progress Notes (Signed)
Patient Care Team: Etter Sjogren as PCP - General (Physician Assistant)  DIAGNOSIS: No diagnosis found.  SUMMARY OF ONCOLOGIC HISTORY: Oncology History   No history exists.    CHIEF COMPLIANT: Follow-up of anemia   INTERVAL HISTORY: Meredith [name] is a 42 y.o. with above-mentioned history of pernicious anemia and Vitamin D deficiency who is receiving a weekly B12 injections. She presents to the clinic today for follow-up.     ALLERGIES:  is allergic to morphine and related, promethazine, and promethazine hcl.  MEDICATIONS:  Current Outpatient Medications  Medication Sig Dispense Refill   cetirizine (ZYRTEC) 10 MG tablet Take 10 mg by mouth as needed for allergies.     cyanocobalamin (,VITAMIN B-12,) 1000 MCG/ML injection Inject into the muscle every 30 (thirty) days. Take depending on my levels     ferrous sulfate 220 (44 Fe) MG/5ML solution Take 220 mg by mouth daily. Injection administered biweekly/weekly depending on levels.     levothyroxine (SYNTHROID) 50 MCG tablet Take 50 mcg by mouth daily before breakfast.     Vitamin D, Ergocalciferol, (DRISDOL) 1.25 MG (50000 UNIT) CAPS capsule TAKE 1 CAPSULE (50,000 UNITS TOTAL) BY MOUTH TWO TIMES A WEEK 24 capsule 3   No current facility-administered medications for this visit.    PHYSICAL EXAMINATION: ECOG PERFORMANCE STATUS: {CHL ONC ECOG PS:[id number]}  There were no vitals filed for this visit. There were no vitals filed for this visit.  BREAST:*** No palpable masses or nodules in either right or left breasts. No palpable axillary supraclavicular or infraclavicular adenopathy no breast tenderness or nipple discharge. (exam performed in the presence of a chaperone)  LABORATORY DATA:  I have reviewed the data as listed    Latest Ref Rng & Units 04/13/2022   11:25 AM 01/17/2021    9:51 AM 07/03/2020    8:29 AM  CMP  Glucose 70 - 99 mg/dL 92  98  104   BUN 6 - 20 mg/dL 9  11  18    Creatinine 0.44 -  1.00 mg/dL 0.72  0.82  0.82   Sodium 135 - 145 mmol/L 141  142  141   Potassium 3.5 - 5.1 mmol/L 3.7  3.7  4.0   Chloride 98 - 111 mmol/L 109  109  108   CO2 22 - 32 mmol/L 26  23  25    Calcium 8.9 - 10.3 mg/dL 9.0  9.1  9.2   Total Protein 6.5 - 8.1 g/dL 6.9  7.2  7.0   Total Bilirubin 0.3 - 1.2 mg/dL 0.3  0.3  0.4   Alkaline Phos 38 - 126 U/L 68  68  69   AST 15 - 41 U/L 10  11  15    ALT 0 - 44 U/L 12  11  17      Lab Results  Component Value Date   WBC 8.3 07/07/2022   HGB 13.2 07/07/2022   HCT 38.8 07/07/2022   MCV 86.6 07/07/2022   PLT 305 07/07/2022   NEUTROABS 5.4 07/07/2022    ASSESSMENT & PLAN:  No problem-specific Assessment & Plan notes found for this encounter.    No orders of the defined types were placed in this encounter.  The patient has a good understanding of the overall plan. she agrees with it. she will call with any problems that may develop before the next visit here. Total time spent: 30 mins including face to face time and time spent for planning, charting and co-ordination of care  [place], CMA 07/13/22    I Gardiner Coins am acting as a Education administrator for [employer]  ***

## 2022-07-16 ENCOUNTER — Inpatient Hospital Stay: Payer: Medicaid Other | Attending: Hematology and Oncology | Admitting: Hematology and Oncology

## 2022-07-16 VITALS — BP 108/69 | HR 89 | Temp 97.7°F | Wt 172.8 lb

## 2022-07-16 DIAGNOSIS — D509 Iron deficiency anemia, unspecified: Secondary | ICD-10-CM

## 2022-07-16 DIAGNOSIS — D510 Vitamin B12 deficiency anemia due to intrinsic factor deficiency: Secondary | ICD-10-CM

## 2022-07-16 NOTE — Assessment & Plan Note (Signed)
Bone marrow biopsy 07/03/2020: 50 to 60% cellularity, normocellular bone marrow with trilineage hematopoiesis.  No dyspoietic or dysplastic changes noted Cytogenetics Neg Recommendation: Sublingual B12 5000 mcg twice a week Severe vitamin D deficiency:  50,000 units of vitamin D twice a week.  Vitamin D levels continue to be low   Severe and profound fatigue: She applied for disability and it was denied. Lab review:  06/25/2021: WBC 12.2, MCV 85.9, ferritin 4, iron saturation 9%, B12 506, folate > 5.9  09/23/2021: Hemoglobin 13.3, MCV 87, B12 466,  iron studies are normal with a ferritin of 36 12/19/21: Iron Sat: 7%, Vit D 25.93, Hb 13.6, Ferritin 6  03/20/2022: Hemoglobin 12.8, MCV 87.4, B12 383, iron saturation 6%, ferritin 52 07/07/2022: Hemoglobin 13.2, MCV 86.6, vitamin D23.51, B12 422, iron saturation 8%, ferritin 6   IV Iron: 06/2021, June 2023   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 Could be autoimmune in nature. She cannot work or function on a day to day basis because of this condition.  I recommended another round of IV iron therapy.

## 2022-07-24 ENCOUNTER — Other Ambulatory Visit: Payer: Self-pay

## 2022-07-24 ENCOUNTER — Inpatient Hospital Stay: Payer: Medicaid Other

## 2022-07-24 VITALS — BP 96/67 | HR 82 | Temp 98.4°F | Resp 18

## 2022-07-24 DIAGNOSIS — E538 Deficiency of other specified B group vitamins: Secondary | ICD-10-CM

## 2022-07-24 DIAGNOSIS — D509 Iron deficiency anemia, unspecified: Secondary | ICD-10-CM | POA: Diagnosis not present

## 2022-07-24 MED ORDER — DIPHENHYDRAMINE HCL 25 MG PO CAPS
25.0000 mg | ORAL_CAPSULE | Freq: Once | ORAL | Status: AC
  Administered 2022-07-24: 25 mg via ORAL
  Filled 2022-07-24: qty 1

## 2022-07-24 MED ORDER — SODIUM CHLORIDE 0.9 % IV SOLN
300.0000 mg | Freq: Once | INTRAVENOUS | Status: AC
  Administered 2022-07-24: 300 mg via INTRAVENOUS
  Filled 2022-07-24: qty 300

## 2022-07-24 MED ORDER — ACETAMINOPHEN 325 MG PO TABS
650.0000 mg | ORAL_TABLET | Freq: Once | ORAL | Status: AC
  Administered 2022-07-24: 650 mg via ORAL
  Filled 2022-07-24: qty 2

## 2022-07-24 MED ORDER — SODIUM CHLORIDE 0.9 % IV SOLN: INTRAVENOUS | Status: DC

## 2022-07-30 ENCOUNTER — Ambulatory Visit
Admission: RE | Admit: 2022-07-30 | Discharge: 2022-07-30 | Disposition: A | Discharge status: Home / Self Care | Payer: Medicaid Other | Source: Ambulatory Visit | Attending: Family Medicine | Admitting: Family Medicine

## 2022-07-30 DIAGNOSIS — E559 Vitamin D deficiency, unspecified: Secondary | ICD-10-CM

## 2022-07-31 ENCOUNTER — Inpatient Hospital Stay: Payer: Medicaid Other

## 2022-08-06 ENCOUNTER — Other Ambulatory Visit: Payer: Self-pay

## 2022-08-06 ENCOUNTER — Other Ambulatory Visit: Payer: Self-pay | Admitting: Physician Assistant

## 2022-08-06 ENCOUNTER — Inpatient Hospital Stay: Payer: Medicaid Other

## 2022-08-06 VITALS — BP 104/62 | HR 84 | Temp 98.3°F | Resp 17

## 2022-08-06 DIAGNOSIS — D509 Iron deficiency anemia, unspecified: Secondary | ICD-10-CM | POA: Diagnosis not present

## 2022-08-06 DIAGNOSIS — E538 Deficiency of other specified B group vitamins: Secondary | ICD-10-CM

## 2022-08-06 DIAGNOSIS — R109 Unspecified abdominal pain: Secondary | ICD-10-CM

## 2022-08-06 MED ORDER — DIPHENHYDRAMINE HCL 25 MG PO CAPS
25.0000 mg | ORAL_CAPSULE | Freq: Once | ORAL | Status: AC
  Administered 2022-08-06: 25 mg via ORAL
  Filled 2022-08-06: qty 1

## 2022-08-06 MED ORDER — ACETAMINOPHEN 325 MG PO TABS
650.0000 mg | ORAL_TABLET | Freq: Once | ORAL | Status: AC
  Administered 2022-08-06: 650 mg via ORAL
  Filled 2022-08-06: qty 2

## 2022-08-06 MED ORDER — CYANOCOBALAMIN 1000 MCG/ML IJ SOLN
1000.0000 ug | Freq: Once | INTRAMUSCULAR | Status: AC
  Administered 2022-08-06: 1000 ug via INTRAMUSCULAR
  Filled 2022-08-06: qty 1

## 2022-08-06 MED ORDER — SODIUM CHLORIDE 0.9 % IV SOLN
300.0000 mg | Freq: Once | INTRAVENOUS | Status: AC
  Administered 2022-08-06: 300 mg via INTRAVENOUS
  Filled 2022-08-06: qty 300

## 2022-08-06 NOTE — Patient Instructions (Signed)

## 2022-08-06 NOTE — Progress Notes (Signed)
Patient declined to stay 30 minute post infusion observation. VSS, no signs of distress noted at discharge. 

## 2022-08-13 ENCOUNTER — Inpatient Hospital Stay: Payer: Medicare Other | Attending: Hematology and Oncology

## 2022-08-13 ENCOUNTER — Other Ambulatory Visit: Payer: Self-pay

## 2022-08-13 VITALS — BP 99/67 | HR 86 | Temp 98.0°F | Resp 18

## 2022-08-13 DIAGNOSIS — E538 Deficiency of other specified B group vitamins: Secondary | ICD-10-CM

## 2022-08-13 DIAGNOSIS — D509 Iron deficiency anemia, unspecified: Secondary | ICD-10-CM | POA: Diagnosis present

## 2022-08-13 DIAGNOSIS — D510 Vitamin B12 deficiency anemia due to intrinsic factor deficiency: Secondary | ICD-10-CM | POA: Diagnosis not present

## 2022-08-13 MED ORDER — ACETAMINOPHEN 325 MG PO TABS
650.0000 mg | ORAL_TABLET | Freq: Once | ORAL | Status: AC
  Administered 2022-08-13: 650 mg via ORAL
  Filled 2022-08-13: qty 2

## 2022-08-13 MED ORDER — DIPHENHYDRAMINE HCL 25 MG PO CAPS
25.0000 mg | ORAL_CAPSULE | Freq: Once | ORAL | Status: AC
  Administered 2022-08-13: 25 mg via ORAL
  Filled 2022-08-13: qty 1

## 2022-08-13 MED ORDER — SODIUM CHLORIDE 0.9 % IV SOLN
300.0000 mg | Freq: Once | INTRAVENOUS | Status: AC
  Administered 2022-08-13: 300 mg via INTRAVENOUS
  Filled 2022-08-13: qty 300

## 2022-08-13 NOTE — Progress Notes (Signed)
Patient declined to stay 30 minute post infusion observation. VSS, no signs of distress noted at discharge. 

## 2022-08-13 NOTE — Progress Notes (Signed)
Pt declined 30 min post iron observation. 

## 2022-08-13 NOTE — Patient Instructions (Signed)

## 2022-08-26 ENCOUNTER — Encounter: Payer: Self-pay | Admitting: Hematology and Oncology

## 2022-08-31 ENCOUNTER — Ambulatory Visit
Admission: RE | Admit: 2022-08-31 | Discharge: 2022-08-31 | Disposition: A | Discharge status: Home / Self Care | Payer: Medicare Other | Source: Ambulatory Visit | Attending: Physician Assistant | Admitting: Physician Assistant

## 2022-08-31 DIAGNOSIS — R109 Unspecified abdominal pain: Secondary | ICD-10-CM

## 2022-09-03 ENCOUNTER — Inpatient Hospital Stay: Payer: Medicare Other

## 2022-09-03 ENCOUNTER — Other Ambulatory Visit: Payer: Self-pay

## 2022-09-03 DIAGNOSIS — E538 Deficiency of other specified B group vitamins: Secondary | ICD-10-CM

## 2022-09-03 DIAGNOSIS — D509 Iron deficiency anemia, unspecified: Secondary | ICD-10-CM | POA: Diagnosis not present

## 2022-09-03 MED ORDER — CYANOCOBALAMIN 1000 MCG/ML IJ SOLN
1000.0000 ug | Freq: Once | INTRAMUSCULAR | Status: AC
  Administered 2022-09-03: 1000 ug via INTRAMUSCULAR
  Filled 2022-09-03: qty 1

## 2022-09-13 ENCOUNTER — Emergency Department (HOSPITAL_COMMUNITY)
Admission: EM | Admit: 2022-09-13 | Discharge: 2022-09-13 | Disposition: A | Discharge status: Home / Self Care | Payer: Medicare Other | Attending: Emergency Medicine | Admitting: Emergency Medicine

## 2022-09-13 ENCOUNTER — Encounter (HOSPITAL_COMMUNITY): Payer: Self-pay

## 2022-09-13 ENCOUNTER — Other Ambulatory Visit: Payer: Self-pay

## 2022-09-13 DIAGNOSIS — R197 Diarrhea, unspecified: Secondary | ICD-10-CM | POA: Insufficient documentation

## 2022-09-13 DIAGNOSIS — E860 Dehydration: Secondary | ICD-10-CM | POA: Diagnosis not present

## 2022-09-13 DIAGNOSIS — R112 Nausea with vomiting, unspecified: Secondary | ICD-10-CM | POA: Insufficient documentation

## 2022-09-13 DIAGNOSIS — Z1152 Encounter for screening for COVID-19: Secondary | ICD-10-CM | POA: Insufficient documentation

## 2022-09-13 LAB — COMPREHENSIVE METABOLIC PANEL
ALT: 14 U/L (ref 0–44)
AST: 15 U/L (ref 15–41)
Albumin: 3.7 g/dL (ref 3.5–5.0)
Alkaline Phosphatase: 57 U/L (ref 38–126)
Anion gap: 6 (ref 5–15)
BUN: 15 mg/dL (ref 6–20)
CO2: 25 mmol/L (ref 22–32)
Calcium: 8.8 mg/dL — ABNORMAL LOW (ref 8.9–10.3)
Chloride: 106 mmol/L (ref 98–111)
Creatinine, Ser: 0.8 mg/dL (ref 0.44–1.00)
GFR, Estimated: >60 mL/min (ref >60)
Glucose, Bld: 115 mg/dL — ABNORMAL HIGH (ref 70–99)
Potassium: 3.5 mmol/L (ref 3.5–5.1)
Sodium: 137 mmol/L (ref 135–145)
Total Bilirubin: 0.3 mg/dL (ref 0.3–1.2)
Total Protein: 6.5 g/dL (ref 6.5–8.1)

## 2022-09-13 LAB — URINALYSIS, ROUTINE W REFLEX MICROSCOPIC
Bacteria, UA: NONE SEEN
Bilirubin Urine: NEGATIVE
Glucose, UA: NEGATIVE mg/dL
Hgb urine dipstick: NEGATIVE
Ketones, ur: NEGATIVE mg/dL
Leukocytes,Ua: NEGATIVE
Nitrite: NEGATIVE
Protein, ur: NEGATIVE mg/dL
Specific Gravity, Urine: 1.008 (ref 1.005–1.030)
pH: 7 (ref 5.0–8.0)

## 2022-09-13 LAB — CBC WITH DIFFERENTIAL/PLATELET
Abs Immature Granulocytes: 0.03 10*3/uL (ref 0.00–0.07)
Basophils Absolute: 0.1 10*3/uL (ref 0.0–0.1)
Basophils Relative: 1 %
Eosinophils Absolute: 0.1 10*3/uL (ref 0.0–0.5)
Eosinophils Relative: 1 %
HCT: 38.4 % (ref 36.0–46.0)
Hemoglobin: 12.7 g/dL (ref 12.0–15.0)
Immature Granulocytes: 0 %
Lymphocytes Relative: 18 %
Lymphs Abs: 1.8 10*3/uL (ref 0.7–4.0)
MCH: 29.3 pg (ref 26.0–34.0)
MCHC: 33.1 g/dL (ref 30.0–36.0)
MCV: 88.5 fL (ref 80.0–100.0)
Monocytes Absolute: 0.7 10*3/uL (ref 0.1–1.0)
Monocytes Relative: 7 %
Neutro Abs: 7.1 10*3/uL (ref 1.7–7.7)
Neutrophils Relative %: 73 %
Platelets: 269 10*3/uL (ref 150–400)
RBC: 4.34 MIL/uL (ref 3.87–5.11)
RDW: 13.3 % (ref 11.5–15.5)
WBC: 9.8 10*3/uL (ref 4.0–10.5)
nRBC: 0 % (ref 0.0–0.2)

## 2022-09-13 LAB — RESP PANEL BY RT-PCR (RSV, FLU A&B, COVID)  RVPGX2
Influenza A by PCR: NEGATIVE
Influenza B by PCR: NEGATIVE
Resp Syncytial Virus by PCR: NEGATIVE
SARS Coronavirus 2 by RT PCR: NEGATIVE

## 2022-09-13 LAB — I-STAT BETA HCG BLOOD, ED (MC, WL, AP ONLY): I-stat hCG, quantitative: <5 m[IU]/mL (ref <5)

## 2022-09-13 LAB — LIPASE, BLOOD: Lipase: 29 U/L (ref 11–51)

## 2022-09-13 MED ORDER — SODIUM CHLORIDE 0.9 % IV BOLUS
1000.0000 mL | Freq: Once | INTRAVENOUS | Status: AC
  Administered 2022-09-13: 1000 mL via INTRAVENOUS

## 2022-09-13 MED ORDER — ONDANSETRON 4 MG PO TBDP: 4.0000 mg | ORAL_TABLET | Freq: Three times a day (TID) | ORAL | 0 refills | Status: DC | PRN

## 2022-09-13 MED ORDER — ACETAMINOPHEN 325 MG PO TABS
650.0000 mg | ORAL_TABLET | Freq: Once | ORAL | Status: AC
  Administered 2022-09-13: 650 mg via ORAL
  Filled 2022-09-13: qty 2

## 2022-09-13 NOTE — ED Triage Notes (Signed)
N/V/D x1 hour PTA. 4 zofran, 300 LR with EMS. VSS. + orthostatics with EMS.

## 2022-09-13 NOTE — ED Notes (Signed)
Patient ambulated to restroom with no assistance.  

## 2022-09-13 NOTE — Discharge Instructions (Signed)
Count for your respiratory viral panel later today.  Take Zofran as needed as prescribed.  Recheck with your primary care provider.

## 2022-09-13 NOTE — ED Provider Notes (Signed)
[facility] Provider Note   CSN: [id number] Arrival date & time: 09/13/22  0150     History  Chief Complaint  Patient presents with   Nausea   Emesis    Meredith [name] is a 42 y.o. female.  42 year old female with past medical history of autoimmune disorder, chronic fatigue syndrome, pernicious anemia presents with complaint of multiple episodes of vomiting and diarrhea onset earlier this morning, woke her from her sleep.  States she went to bed feeling tired which is not unusual for her.  No reports of blood in emesis or diarrhea, provided with Zofran and IV fluids with EMS, found to be orthostatic with EMS.       Home Medications Prior to Admission medications   Medication Sig Start Date End Date Taking? Authorizing Provider  cyanocobalamin (,VITAMIN B-12,) 1000 MCG/ML injection Inject into the muscle every 30 (thirty) days. Take depending on my levels   Yes [provider]  ferrous sulfate 220 (44 Fe) MG/5ML solution Take 220 mg by mouth daily as needed (low iron). Injection administered biweekly/weekly depending on levels.   Yes [provider]  ibuprofen (ADVIL) 200 MG tablet Take 200 mg by mouth every 6 (six) hours as needed for moderate pain.   Yes [provider]  levothyroxine (SYNTHROID) 50 MCG tablet Take 50 mcg by mouth daily before breakfast.   Yes [provider]  ondansetron (ZOFRAN-ODT) 4 MG disintegrating tablet Take 1 tablet (4 mg total) by mouth every 8 (eight) hours as needed for nausea or vomiting. 09/13/22  Yes Tacy Learn, PA-C  Vitamin D, Ergocalciferol, (DRISDOL) 1.25 MG (50000 UNIT) CAPS capsule TAKE 1 CAPSULE (50,000 UNITS TOTAL) BY MOUTH TWO TIMES A WEEK 12/18/21  Yes Nicholas Lose, MD      Allergies    Morphine and related, Promethazine, and Promethazine hcl    Review of Systems   Review of Systems Negative except as per HPI Physical Exam Updated Vital  Signs BP 103/69   Pulse 80   Temp 97.8 F (36.6 C) (Oral)   Resp 18   Ht '5\' 2"'$  (1.575 m)   Wt 78 kg   SpO2 96%   BMI 31.46 kg/m  Physical Exam Vitals and nursing note reviewed.  Constitutional:      General: She is not in acute distress.    Appearance: She is well-developed. She is not diaphoretic.  HENT:     Head: Normocephalic and atraumatic.  Cardiovascular:     Rate and Rhythm: Normal rate and regular rhythm.     Heart sounds: Normal heart sounds.  Pulmonary:     Effort: Pulmonary effort is normal.     Breath sounds: Normal breath sounds.  Abdominal:     Palpations: Abdomen is soft.     Tenderness: There is no abdominal tenderness.  Musculoskeletal:     Cervical back: Neck supple.     Right lower leg: No edema.     Left lower leg: No edema.  Skin:    General: Skin is warm and dry.     Findings: No erythema or rash.  Neurological:     Mental Status: She is alert and oriented to person, place, and time.  Psychiatric:        Behavior: Behavior normal.     ED Results / Procedures / Treatments   Labs (all labs ordered are listed, but only abnormal results are displayed) Labs Reviewed  COMPREHENSIVE METABOLIC PANEL -  Abnormal; Notable for the following components:      Result Value   Glucose, Bld 115 (*)    Calcium 8.8 (*)    All other components within normal limits  URINALYSIS, ROUTINE W REFLEX MICROSCOPIC - Abnormal; Notable for the following components:   Color, Urine STRAW (*)    All other components within normal limits  RESP PANEL BY RT-PCR (RSV, FLU A&B, COVID)  [id number]  CBC WITH DIFFERENTIAL/PLATELET  LIPASE, BLOOD  I-STAT BETA HCG BLOOD, ED ([facility], [facility], [facility] ONLY)    EKG None  Radiology No results found.  Procedures Procedures    Medications Ordered in ED Medications  acetaminophen (TYLENOL) tablet 650 mg (650 mg Oral Given 09/13/22 0357)  sodium chloride 0.9 % bolus 1,000 mL (0 mLs Intravenous Stopped 09/13/22 0518)    ED Course/ Medical  Decision Making/ A&P                             Medical Decision Making Amount and/or Complexity of Data Reviewed Labs: ordered.  Risk OTC drugs. Prescription drug management.   This patient presents to the ED for concern of vomiting, diarrhea, this involves an extensive number of treatment options, and is a complaint that carries with it a high risk of complications and morbidity.  The differential diagnosis includes but not limited to dehydration, metabolic abnormality, viral illness.   Co morbidities that complicate the patient evaluation  Autoimmune disorder, chronic fatigue syndrome, pernicious anemia   Additional history obtained:  Additional history obtained from EMS who reports patient to be orthostatic on their arrival  External records from outside source obtained and reviewed including prior labs on file for comparison   Lab Tests:  I Ordered, and personally interpreted labs.  The pertinent results include: Urinalysis normal.  hCG negative.  Lipase within normal notes.  CBC within normal limits.  CMP without significant findings.  Cardiac Monitoring: / EKG:  Orthostatic vitals rechecked after IV hydration, not found to be orthostatic    Problem List / ED Course / Critical interventions / Medication management  42 year old female presents via EMS after vomiting and diarrhea.  Orthostatic on scene.  Stable on arrival.  Provided with IV fluids, labs reassuring.  No longer orthostatic on recheck.  Zofran sent to patient's pharmacy.  Recommend follow-up in MyChart later today for her respiratory viral panel result. I ordered medication including IV fluids, Tylenol for volume repletion, headache Reevaluation of the patient after these medicines showed that the patient improved I have reviewed the patients home medicines and have made adjustments as needed   Social Determinants of Health:  Has PCP   Test / Admission - Considered:  Add on respiratory viral  panel as requested at time of discharge, can be followed in [place] later today         Final Clinical Impression(s) / ED Diagnoses Final diagnoses:  Nausea and vomiting, unspecified vomiting type  Dehydration    Rx / DC Orders ED Discharge Orders          Ordered    ondansetron (ZOFRAN-ODT) 4 MG disintegrating tablet  Every 8 hours PRN        09/13/22 0603              Tacy Learn, PA-C 09/13/22 [id number]    Fatima Blank, MD 09/13/22 [phone]

## 2022-09-29 ENCOUNTER — Encounter: Payer: Self-pay | Admitting: Hematology and Oncology

## 2022-10-01 ENCOUNTER — Other Ambulatory Visit: Payer: Self-pay

## 2022-10-01 ENCOUNTER — Inpatient Hospital Stay: Payer: Medicare Other | Attending: Hematology and Oncology

## 2022-10-01 DIAGNOSIS — D510 Vitamin B12 deficiency anemia due to intrinsic factor deficiency: Secondary | ICD-10-CM | POA: Diagnosis not present

## 2022-10-01 DIAGNOSIS — E538 Deficiency of other specified B group vitamins: Secondary | ICD-10-CM

## 2022-10-01 MED ORDER — CYANOCOBALAMIN 1000 MCG/ML IJ SOLN
1000.0000 ug | Freq: Once | INTRAMUSCULAR | Status: AC
  Administered 2022-10-01: 1000 ug via INTRAMUSCULAR
  Filled 2022-10-01: qty 1

## 2022-10-26 ENCOUNTER — Other Ambulatory Visit: Payer: Self-pay

## 2022-10-26 ENCOUNTER — Inpatient Hospital Stay: Payer: Medicare Other | Attending: Hematology and Oncology

## 2022-10-26 DIAGNOSIS — D509 Iron deficiency anemia, unspecified: Secondary | ICD-10-CM | POA: Diagnosis not present

## 2022-10-26 DIAGNOSIS — D510 Vitamin B12 deficiency anemia due to intrinsic factor deficiency: Secondary | ICD-10-CM | POA: Diagnosis present

## 2022-10-26 DIAGNOSIS — E559 Vitamin D deficiency, unspecified: Secondary | ICD-10-CM | POA: Diagnosis not present

## 2022-10-26 DIAGNOSIS — R5382 Chronic fatigue, unspecified: Secondary | ICD-10-CM | POA: Diagnosis not present

## 2022-10-26 LAB — IRON AND IRON BINDING CAPACITY (CC-WL,HP ONLY)
Iron: 59 ug/dL (ref 28–170)
Saturation Ratios: 16 % (ref 10.4–31.8)
TIBC: 372 ug/dL (ref 250–450)
UIBC: 313 ug/dL (ref 148–442)

## 2022-10-26 LAB — VITAMIN D 25 HYDROXY (VIT D DEFICIENCY, FRACTURES): Vit D, 25-Hydroxy: 29.46 ng/mL — ABNORMAL LOW (ref 30–100)

## 2022-10-26 LAB — CBC WITH DIFFERENTIAL (CANCER CENTER ONLY)
Abs Immature Granulocytes: 0.02 10*3/uL (ref 0.00–0.07)
Basophils Absolute: 0.1 10*3/uL (ref 0.0–0.1)
Basophils Relative: 1 %
Eosinophils Absolute: 0.2 10*3/uL (ref 0.0–0.5)
Eosinophils Relative: 2 %
HCT: 40.9 % (ref 36.0–46.0)
Hemoglobin: 13.9 g/dL (ref 12.0–15.0)
Immature Granulocytes: 0 %
Lymphocytes Relative: 26 %
Lymphs Abs: 2.2 10*3/uL (ref 0.7–4.0)
MCH: 29.7 pg (ref 26.0–34.0)
MCHC: 34 g/dL (ref 30.0–36.0)
MCV: 87.4 fL (ref 80.0–100.0)
Monocytes Absolute: 0.6 10*3/uL (ref 0.1–1.0)
Monocytes Relative: 7 %
Neutro Abs: 5.4 10*3/uL (ref 1.7–7.7)
Neutrophils Relative %: 64 %
Platelet Count: 296 10*3/uL (ref 150–400)
RBC: 4.68 MIL/uL (ref 3.87–5.11)
RDW: 13.3 % (ref 11.5–15.5)
WBC Count: 8.4 10*3/uL (ref 4.0–10.5)
nRBC: 0 % (ref 0.0–0.2)

## 2022-10-26 LAB — FERRITIN: Ferritin: 37 ng/mL (ref 11–307)

## 2022-10-26 LAB — VITAMIN B12: Vitamin B-12: 470 pg/mL (ref 180–914)

## 2022-10-27 NOTE — Progress Notes (Signed)
Patient Care Team: [name] as PCP - General (Physician Assistant)  DIAGNOSIS: No diagnosis found.  SUMMARY OF ONCOLOGIC HISTORY: Oncology History   No history exists.    CHIEF COMPLIANT: Follow-up of anemia     INTERVAL HISTORY: Meredith [name] is a 42 y.o. with above-mentioned history of pernicious anemia and Vitamin D deficiency who is receiving B12 injections. She presents to the clinic today for follow-up.    ALLERGIES:  is allergic to morphine and related, promethazine, and promethazine hcl.  MEDICATIONS:  Current Outpatient Medications  Medication Sig Dispense Refill   cyanocobalamin (,VITAMIN B-12,) 1000 MCG/ML injection Inject into the muscle every 30 (thirty) days. Take depending on my levels     ferrous sulfate 220 (44 Fe) MG/5ML solution Take 220 mg by mouth daily as needed (low iron). Injection administered biweekly/weekly depending on levels.     ibuprofen (ADVIL) 200 MG tablet Take 200 mg by mouth every 6 (six) hours as needed for moderate pain.     levothyroxine (SYNTHROID) 50 MCG tablet Take 50 mcg by mouth daily before breakfast.     ondansetron (ZOFRAN-ODT) 4 MG disintegrating tablet Take 1 tablet (4 mg total) by mouth every 8 (eight) hours as needed for nausea or vomiting. 12 tablet 0   Vitamin D, Ergocalciferol, (DRISDOL) 1.25 MG (50000 UNIT) CAPS capsule TAKE 1 CAPSULE (50,000 UNITS TOTAL) BY MOUTH TWO TIMES A WEEK 24 capsule 3   No current facility-administered medications for this visit.    PHYSICAL EXAMINATION: ECOG PERFORMANCE STATUS: {CHL ONC ECOG [id number]}  There were no vitals filed for this visit. There were no vitals filed for this visit.  BREAST:*** No palpable masses or nodules in either right or left breasts. No palpable axillary supraclavicular or infraclavicular adenopathy no breast tenderness or nipple discharge. (exam performed in the presence of a chaperone)  LABORATORY DATA:  I have reviewed the data as  listed    Latest Ref Rng & Units 09/13/2022    2:39 AM 04/13/2022   11:25 AM 01/17/2021    9:51 AM  CMP  Glucose 70 - 99 mg/dL 161  92  98   BUN 6 - 20 mg/dL Creatinine 0.44 - 1.00 mg/dL 0.96  0.45  4.09   Sodium 135 - 145 mmol/L 137  141  142   Potassium 3.5 - 5.1 mmol/L 3.5  3.7  3.7   Chloride 98 - 111 mmol/L 106  109  109   CO2 22 - 32 mmol/L Calcium 8.9 - 10.3 mg/dL 8.8  9.0  9.1   Total Protein 6.5 - 8.1 g/dL 6.5  6.9  7.2   Total Bilirubin 0.3 - 1.2 mg/dL 0.3  0.3  0.3   Alkaline Phos 38 - 126 U/L 57  68  68   AST 15 - 41 U/L ALT 0 - 44 U/L Lab Results  Component Value Date   WBC 8.4 10/26/2022   HGB 13.9 10/26/2022   HCT 40.9 10/26/2022   MCV 87.4 10/26/2022   PLT 296 10/26/2022   NEUTROABS 5.4 10/26/2022    ASSESSMENT & PLAN:  No problem-specific Assessment & Plan notes found for this encounter.    No orders of the defined types were placed in this encounter.  The patient has a good understanding of the overall plan. she  agrees with it. she will call with any problems that may develop before the next visit here. Total time spent: 30 mins including face to face time and time spent for planning, charting and co-ordination of care   Sherlyn Lick, CMA 10/27/22    I Janan Ridge am acting as a Neurosurgeon for [employer]  ***

## 2022-10-29 ENCOUNTER — Other Ambulatory Visit: Payer: Self-pay

## 2022-10-29 ENCOUNTER — Inpatient Hospital Stay: Payer: Medicare Other

## 2022-10-29 ENCOUNTER — Inpatient Hospital Stay: Payer: Medicare Other | Admitting: Hematology and Oncology

## 2022-10-29 VITALS — BP 113/66 | HR 83 | Temp 97.7°F | Resp 18 | Ht 62.0 in | Wt 172.4 lb

## 2022-10-29 DIAGNOSIS — R5382 Chronic fatigue, unspecified: Secondary | ICD-10-CM | POA: Diagnosis not present

## 2022-10-29 DIAGNOSIS — E538 Deficiency of other specified B group vitamins: Secondary | ICD-10-CM

## 2022-10-29 DIAGNOSIS — D509 Iron deficiency anemia, unspecified: Secondary | ICD-10-CM | POA: Diagnosis not present

## 2022-10-29 DIAGNOSIS — D510 Vitamin B12 deficiency anemia due to intrinsic factor deficiency: Secondary | ICD-10-CM

## 2022-10-29 MED ORDER — CYANOCOBALAMIN 1000 MCG/ML IJ SOLN
1000.0000 ug | Freq: Once | INTRAMUSCULAR | Status: AC
  Administered 2022-10-29: 1000 ug via INTRAMUSCULAR
  Filled 2022-10-29: qty 1

## 2022-10-29 NOTE — Assessment & Plan Note (Signed)
Bone marrow biopsy 07/03/2020: 50 to 60% cellularity, normocellular bone marrow with trilineage hematopoiesis.  No dyspoietic or dysplastic changes noted Cytogenetics Neg Recommendation: Sublingual B12 5000 mcg twice a week Severe vitamin D deficiency:  50,000 units of vitamin D twice a week.  Vitamin D levels continue to be low   Severe and profound fatigue: She applied for disability and it was denied. Lab review:  06/25/2021: WBC 12.2, MCV 85.9, ferritin 4, iron saturation 9%, B12 506, folate > 5.9  09/23/2021: Hemoglobin 13.3, MCV 87, B12 466,  iron studies are normal with a ferritin of 36 12/19/21: Iron Sat: 7%, Vit D 25.93, Hb 13.6, Ferritin 6  03/20/2022: Hemoglobin 12.8, MCV 87.4, B12 383, iron saturation 6%, ferritin 52 07/07/2022: Hemoglobin 13.2, MCV 86.6, vitamin D23.51, B12 422, iron saturation 8%, ferritin 6 10/26/2022: Hemoglobin 13.9, MCV 87.4, vitamin D29.4, B12 470, iron saturation 16%, ferritin 37   IV Iron: 06/2021, June 2023, January 2024   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 Could be autoimmune in nature. She cannot work or function on a day to day basis because of this condition.  Based on the above lab there is no role for IV iron therapy at this time. We will recheck labs and follow-up in 3 months.

## 2022-11-19 ENCOUNTER — Encounter: Payer: Self-pay | Admitting: Hematology and Oncology

## 2022-11-26 ENCOUNTER — Inpatient Hospital Stay: Payer: Medicare Other | Attending: Hematology and Oncology

## 2022-11-26 ENCOUNTER — Other Ambulatory Visit: Payer: Self-pay

## 2022-11-26 VITALS — BP 115/77 | HR 78 | Resp 16

## 2022-11-26 DIAGNOSIS — E538 Deficiency of other specified B group vitamins: Secondary | ICD-10-CM

## 2022-11-26 DIAGNOSIS — D510 Vitamin B12 deficiency anemia due to intrinsic factor deficiency: Secondary | ICD-10-CM | POA: Diagnosis present

## 2022-11-26 MED ORDER — CYANOCOBALAMIN 1000 MCG/ML IJ SOLN
1000.0000 ug | Freq: Once | INTRAMUSCULAR | Status: AC
  Administered 2022-11-26: 1000 ug via INTRAMUSCULAR
  Filled 2022-11-26: qty 1

## 2022-12-24 ENCOUNTER — Inpatient Hospital Stay: Payer: Medicare Other | Attending: Hematology and Oncology

## 2022-12-24 DIAGNOSIS — D510 Vitamin B12 deficiency anemia due to intrinsic factor deficiency: Secondary | ICD-10-CM | POA: Diagnosis present

## 2022-12-24 DIAGNOSIS — E538 Deficiency of other specified B group vitamins: Secondary | ICD-10-CM

## 2022-12-24 MED ORDER — CYANOCOBALAMIN 1000 MCG/ML IJ SOLN
1000.0000 ug | Freq: Once | INTRAMUSCULAR | Status: AC
  Administered 2022-12-24: 1000 ug via INTRAMUSCULAR
  Filled 2022-12-24: qty 1

## 2023-01-12 ENCOUNTER — Other Ambulatory Visit: Payer: Self-pay

## 2023-01-12 ENCOUNTER — Inpatient Hospital Stay: Payer: Medicare Other | Attending: Hematology and Oncology

## 2023-01-12 DIAGNOSIS — E559 Vitamin D deficiency, unspecified: Secondary | ICD-10-CM | POA: Insufficient documentation

## 2023-01-12 DIAGNOSIS — R5382 Chronic fatigue, unspecified: Secondary | ICD-10-CM

## 2023-01-12 DIAGNOSIS — E538 Deficiency of other specified B group vitamins: Secondary | ICD-10-CM

## 2023-01-12 DIAGNOSIS — D509 Iron deficiency anemia, unspecified: Secondary | ICD-10-CM | POA: Diagnosis present

## 2023-01-12 DIAGNOSIS — D510 Vitamin B12 deficiency anemia due to intrinsic factor deficiency: Secondary | ICD-10-CM | POA: Insufficient documentation

## 2023-01-12 LAB — CBC WITH DIFFERENTIAL (CANCER CENTER ONLY)
Abs Immature Granulocytes: 0.03 10*3/uL (ref 0.00–0.07)
Basophils Absolute: 0.1 10*3/uL (ref 0.0–0.1)
Basophils Relative: 1 %
Eosinophils Absolute: 0.2 10*3/uL (ref 0.0–0.5)
Eosinophils Relative: 2 %
HCT: 37.5 % (ref 36.0–46.0)
Hemoglobin: 12.5 g/dL (ref 12.0–15.0)
Immature Granulocytes: 0 %
Lymphocytes Relative: 22 %
Lymphs Abs: 1.8 10*3/uL (ref 0.7–4.0)
MCH: 29.8 pg (ref 26.0–34.0)
MCHC: 33.3 g/dL (ref 30.0–36.0)
MCV: 89.5 fL (ref 80.0–100.0)
Monocytes Absolute: 0.5 10*3/uL (ref 0.1–1.0)
Monocytes Relative: 7 %
Neutro Abs: 5.7 10*3/uL (ref 1.7–7.7)
Neutrophils Relative %: 68 %
Platelet Count: 356 10*3/uL (ref 150–400)
RBC: 4.19 MIL/uL (ref 3.87–5.11)
RDW: 12.8 % (ref 11.5–15.5)
WBC Count: 8.4 10*3/uL (ref 4.0–10.5)
nRBC: 0 % (ref 0.0–0.2)

## 2023-01-12 LAB — IRON AND IRON BINDING CAPACITY (CC-WL,HP ONLY)
Iron: 49 ug/dL (ref 28–170)
Saturation Ratios: 14 % (ref 10.4–31.8)
TIBC: 364 ug/dL (ref 250–450)
UIBC: 315 ug/dL (ref 148–442)

## 2023-01-12 LAB — VITAMIN D 25 HYDROXY (VIT D DEFICIENCY, FRACTURES): Vit D, 25-Hydroxy: 29.49 ng/mL — ABNORMAL LOW (ref 30–100)

## 2023-01-12 LAB — VITAMIN B12: Vitamin B-12: 564 pg/mL (ref 180–914)

## 2023-01-12 LAB — FERRITIN: Ferritin: 7 ng/mL — ABNORMAL LOW (ref 11–307)

## 2023-01-14 LAB — COPPER, SERUM: Copper: 95 ug/dL (ref 80–158)

## 2023-01-14 LAB — ZINC: Zinc: 82 ug/dL (ref 44–115)

## 2023-01-14 LAB — SELENIUM SERUM: Selenium: 132 ug/L (ref 93–198)

## 2023-01-18 NOTE — Progress Notes (Signed)
Patient Care Team: [name] as PCP - General (Physician Assistant)  DIAGNOSIS: No diagnosis found.  SUMMARY OF ONCOLOGIC HISTORY: Oncology History   No history exists.    CHIEF COMPLIANT:   INTERVAL HISTORY: Meredith [name] is a   ALLERGIES:  is allergic to morphine and codeine, promethazine, and promethazine hcl.  MEDICATIONS:  Current Outpatient Medications  Medication Sig Dispense Refill   cyanocobalamin (,VITAMIN B-12,) 1000 MCG/ML injection Inject into the muscle every 30 (thirty) days. Take depending on my levels     ferrous sulfate 220 (44 Fe) MG/5ML solution Take 220 mg by mouth daily as needed (low iron). Injection administered biweekly/weekly depending on levels.     ibuprofen (ADVIL) 200 MG tablet Take 200 mg by mouth every 6 (six) hours as needed for moderate pain.     levothyroxine (SYNTHROID) 50 MCG tablet Take 50 mcg by mouth daily before breakfast.     ondansetron (ZOFRAN-ODT) 4 MG disintegrating tablet Take 1 tablet (4 mg total) by mouth every 8 (eight) hours as needed for nausea or vomiting. 12 tablet 0   Vitamin D, Ergocalciferol, (DRISDOL) 1.25 MG (50000 UNIT) CAPS capsule TAKE 1 CAPSULE (50,000 UNITS TOTAL) BY MOUTH TWO TIMES A WEEK 24 capsule 3   No current facility-administered medications for this visit.    PHYSICAL EXAMINATION: ECOG PERFORMANCE STATUS: {CHL ONC ECOG PS:[id number]}  There were no vitals filed for this visit. There were no vitals filed for this visit.  BREAST:*** No palpable masses or nodules in either right or left breasts. No palpable axillary supraclavicular or infraclavicular adenopathy no breast tenderness or nipple discharge. (exam performed in the presence of a chaperone)  LABORATORY DATA:  I have reviewed the data as listed    Latest Ref Rng & Units 09/13/2022    2:39 AM 04/13/2022   11:25 AM 01/17/2021    9:51 AM  CMP  Glucose 70 - 99 mg/dL 604  92  98   BUN 6 - 20 mg/dL 15  9  11    Creatinine  0.44 - 1.00 mg/dL 5.40  9.81  1.91   Sodium 135 - 145 mmol/L 137  141  142   Potassium 3.5 - 5.1 mmol/L 3.5  3.7  3.7   Chloride 98 - 111 mmol/L 106  109  109   CO2 22 - 32 mmol/L 25  26  23    Calcium 8.9 - 10.3 mg/dL 8.8  9.0  9.1   Total Protein 6.5 - 8.1 g/dL 6.5  6.9  7.2   Total Bilirubin 0.3 - 1.2 mg/dL 0.3  0.3  0.3   Alkaline Phos 38 - 126 U/L 57  68  68   AST 15 - 41 U/L 15  10  11    ALT 0 - 44 U/L 14  12  11      Lab Results  Component Value Date   WBC 8.4 01/12/2023   HGB 12.5 01/12/2023   HCT 37.5 01/12/2023   MCV 89.5 01/12/2023   PLT 356 01/12/2023   NEUTROABS 5.7 01/12/2023    ASSESSMENT & PLAN:  No problem-specific Assessment & Plan notes found for this encounter.    No orders of the defined types were placed in this encounter.  The patient has a good understanding of the overall plan. she agrees with it. she will call with any problems that may develop before the next visit here. Total time spent: 30 mins including face to face time and time spent for  planning, charting and co-ordination of care   Sherlyn Lick, CMA 01/18/23    I Janan Ridge am acting as a Neurosurgeon for [employer]  ***

## 2023-01-20 ENCOUNTER — Inpatient Hospital Stay: Payer: Medicare Other

## 2023-01-20 ENCOUNTER — Inpatient Hospital Stay (HOSPITAL_BASED_OUTPATIENT_CLINIC_OR_DEPARTMENT_OTHER): Payer: Medicare Other | Admitting: Hematology and Oncology

## 2023-01-20 ENCOUNTER — Other Ambulatory Visit: Payer: Self-pay

## 2023-01-20 VITALS — BP 109/67 | HR 80 | Temp 97.5°F | Resp 18 | Ht 62.0 in | Wt 170.5 lb

## 2023-01-20 DIAGNOSIS — E538 Deficiency of other specified B group vitamins: Secondary | ICD-10-CM

## 2023-01-20 DIAGNOSIS — D509 Iron deficiency anemia, unspecified: Secondary | ICD-10-CM

## 2023-01-20 MED ORDER — CYANOCOBALAMIN 1000 MCG/ML IJ SOLN
1000.0000 ug | Freq: Once | INTRAMUSCULAR | Status: AC
  Administered 2023-01-20: 1000 ug via INTRAMUSCULAR
  Filled 2023-01-20: qty 1

## 2023-01-20 NOTE — Assessment & Plan Note (Addendum)
Bone marrow biopsy 07/03/2020: 50 to 60% cellularity, normocellular bone marrow with trilineage hematopoiesis.  No dyspoietic or dysplastic changes noted Cytogenetics Neg Recommendation: Sublingual B12 5000 mcg twice a week Severe vitamin D deficiency:  50,000 units of vitamin D twice a week.  Vitamin D levels continue to be low   Severe and profound fatigue: She applied for disability and it was denied. Lab review:  06/25/2021: WBC 12.2, MCV 85.9, ferritin 4, iron saturation 9%, B12 506, folate > 5.9  09/23/2021: Hemoglobin 13.3, MCV 87, B12 466,  iron studies are normal with a ferritin of 36 12/19/21: Iron Sat: 7%, Vit D 25.93, Hb 13.6, Ferritin 6  03/20/2022: Hemoglobin 12.8, MCV 87.4, B12 383, iron saturation 6%, ferritin 52 07/07/2022: Hemoglobin 13.2, MCV 86.6, vitamin D23.51, B12 422, iron saturation 8%, ferritin 6 10/26/2022: Hemoglobin 13.9, MCV 87.4, vitamin D29.4, B12 470, iron saturation 16%, ferritin 37 01/12/2023: Hemoglobin 12.5 MCV 89.5, iron saturation 14%, ferritin 770 and 132, zinc 82, copper 95 (normal), B12 564, vitamin D 29.49   IV Iron: 06/2021, June 2023, January 2024   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 Could be autoimmune in nature. She cannot work or function on a day to day basis because of this condition.  Continue with monthly B12 injections Because the ferritin is 7, I recommended IV iron infusion again. Joint aches and pains: Encouraged her to take magnesium supplementation.

## 2023-01-25 ENCOUNTER — Encounter: Payer: Self-pay | Admitting: Hematology and Oncology

## 2023-01-29 ENCOUNTER — Telehealth: Payer: Self-pay | Admitting: Hematology and Oncology

## 2023-01-29 NOTE — Telephone Encounter (Signed)
Scheduled appointments per los. Left voicemail for patient.

## 2023-02-04 ENCOUNTER — Inpatient Hospital Stay: Payer: Medicare Other

## 2023-02-08 ENCOUNTER — Other Ambulatory Visit: Payer: Self-pay | Admitting: Nurse Practitioner

## 2023-02-08 DIAGNOSIS — Z0000 Encounter for general adult medical examination without abnormal findings: Secondary | ICD-10-CM

## 2023-02-09 ENCOUNTER — Emergency Department (HOSPITAL_COMMUNITY): Payer: Medicare Other

## 2023-02-09 ENCOUNTER — Other Ambulatory Visit: Payer: Self-pay

## 2023-02-09 ENCOUNTER — Emergency Department (HOSPITAL_COMMUNITY)
Admission: EM | Admit: 2023-02-09 | Discharge: 2023-02-10 | Disposition: A | Discharge status: Home / Self Care | Payer: Medicare Other | Attending: Emergency Medicine | Admitting: Emergency Medicine

## 2023-02-09 ENCOUNTER — Encounter (HOSPITAL_COMMUNITY): Payer: Self-pay

## 2023-02-09 DIAGNOSIS — D72829 Elevated white blood cell count, unspecified: Secondary | ICD-10-CM | POA: Insufficient documentation

## 2023-02-09 DIAGNOSIS — Z1152 Encounter for screening for COVID-19: Secondary | ICD-10-CM | POA: Insufficient documentation

## 2023-02-09 DIAGNOSIS — R519 Headache, unspecified: Secondary | ICD-10-CM

## 2023-02-09 DIAGNOSIS — E039 Hypothyroidism, unspecified: Secondary | ICD-10-CM | POA: Diagnosis not present

## 2023-02-09 LAB — BASIC METABOLIC PANEL
Anion gap: 9 (ref 5–15)
BUN: 17 mg/dL (ref 6–20)
CO2: 26 mmol/L (ref 22–32)
Calcium: 9.6 mg/dL (ref 8.9–10.3)
Chloride: 103 mmol/L (ref 98–111)
Creatinine, Ser: 0.69 mg/dL (ref 0.44–1.00)
GFR, Estimated: >60 mL/min (ref >60)
Glucose, Bld: 88 mg/dL (ref 70–99)
Potassium: 4 mmol/L (ref 3.5–5.1)
Sodium: 138 mmol/L (ref 135–145)

## 2023-02-09 LAB — CBC
HCT: 39 % (ref 36.0–46.0)
Hemoglobin: 12.6 g/dL (ref 12.0–15.0)
MCH: 28.6 pg (ref 26.0–34.0)
MCHC: 32.3 g/dL (ref 30.0–36.0)
MCV: 88.4 fL (ref 80.0–100.0)
Platelets: 403 10*3/uL — ABNORMAL HIGH (ref 150–400)
RBC: 4.41 MIL/uL (ref 3.87–5.11)
RDW: 12.7 % (ref 11.5–15.5)
WBC: 13.8 10*3/uL — ABNORMAL HIGH (ref 4.0–10.5)
nRBC: 0 % (ref 0.0–0.2)

## 2023-02-09 LAB — SARS CORONAVIRUS 2 BY RT PCR: SARS Coronavirus 2 by RT PCR: NEGATIVE

## 2023-02-09 LAB — PREGNANCY, URINE: Preg Test, Ur: NEGATIVE

## 2023-02-09 NOTE — ED Triage Notes (Addendum)
Pt arrived POV with a sqeezing H/A x5 days,attempted OTC meds but with minimal relief. No hx of migraines. Seen PCP today prescribed 4mg  prednisone x6days, took a total 16mg  today. PCP recommended to come into ED if no relief. Light sensitivity, nausea, and blurred vision present as well.

## 2023-02-09 NOTE — ED Provider Triage Note (Signed)
Emergency Medicine Provider Triage Evaluation Note  Meredith [name] , a 42 y.o. female  was evaluated in triage.  Pt complains of headache. Started 5 days ago. Came on all quickly and has been persistent. Endorsed intermittent blurriness in both eyes. No dizziness. Also endorses nausea, sensitivity to light and sound. Denies fever and nuchal rigidity.   Review of Systems  Positive: See above Negative: See above  Physical Exam  BP 125/84 (BP Location: Left Arm)   Pulse (!) 106   Temp 99.4 F (37.4 C) (Oral)   Resp 18   Ht 5\' 3"  (1.6 m)   Wt 77.1 kg   SpO2 98%   BMI 30.11 kg/m  Gen:   Awake, no distress   Resp:  Normal effort  MSK:   Moves extremities without difficulty  Other:    Medical Decision Making  Medically screening exam initiated at 9:14 PM.  Appropriate orders placed.  Meredith [name] was informed that the remainder of the evaluation will be completed by another provider, this initial triage assessment does not replace that evaluation, and the importance of remaining in the ED until their evaluation is complete.  Work up started   Gareth Eagle, [place] 02/09/23 2116

## 2023-02-10 ENCOUNTER — Inpatient Hospital Stay: Payer: Medicare Other

## 2023-02-10 ENCOUNTER — Telehealth: Payer: Self-pay

## 2023-02-10 ENCOUNTER — Other Ambulatory Visit: Payer: Self-pay

## 2023-02-10 VITALS — BP 102/66 | HR 86 | Temp 98.1°F | Resp 23

## 2023-02-10 DIAGNOSIS — R519 Headache, unspecified: Secondary | ICD-10-CM | POA: Diagnosis not present

## 2023-02-10 DIAGNOSIS — E538 Deficiency of other specified B group vitamins: Secondary | ICD-10-CM

## 2023-02-10 DIAGNOSIS — D509 Iron deficiency anemia, unspecified: Secondary | ICD-10-CM | POA: Diagnosis not present

## 2023-02-10 MED ORDER — METOCLOPRAMIDE HCL 5 MG/ML IJ SOLN
10.0000 mg | Freq: Once | INTRAMUSCULAR | Status: AC
  Administered 2023-02-10: 10 mg via INTRAVENOUS
  Filled 2023-02-10: qty 2

## 2023-02-10 MED ORDER — ACETAMINOPHEN 325 MG PO TABS
650.0000 mg | ORAL_TABLET | Freq: Once | ORAL | Status: AC
  Administered 2023-02-10: 650 mg via ORAL
  Filled 2023-02-10: qty 2

## 2023-02-10 MED ORDER — SODIUM CHLORIDE 0.9 % IV SOLN: Freq: Once | INTRAVENOUS | Status: AC

## 2023-02-10 MED ORDER — DIPHENHYDRAMINE HCL 25 MG PO CAPS
25.0000 mg | ORAL_CAPSULE | Freq: Once | ORAL | Status: AC
  Administered 2023-02-10: 25 mg via ORAL
  Filled 2023-02-10: qty 1

## 2023-02-10 MED ORDER — SODIUM CHLORIDE 0.9 % IV SOLN
300.0000 mg | Freq: Once | INTRAVENOUS | Status: AC
  Administered 2023-02-10: 300 mg via INTRAVENOUS
  Filled 2023-02-10: qty 300

## 2023-02-10 MED ORDER — KETOROLAC TROMETHAMINE 15 MG/ML IJ SOLN
15.0000 mg | Freq: Once | INTRAMUSCULAR | Status: AC
  Administered 2023-02-10: 15 mg via INTRAVENOUS
  Filled 2023-02-10: qty 1

## 2023-02-10 MED ORDER — LACTATED RINGERS IV BOLUS
1000.0000 mL | Freq: Once | INTRAVENOUS | Status: AC
  Administered 2023-02-10: 1000 mL via INTRAVENOUS

## 2023-02-10 NOTE — ED Provider Notes (Signed)
EMERGENCY DEPARTMENT AT [facility] Provider Note   CSN: [id number] Arrival date & time: 02/09/23  1921     History  Chief Complaint  Patient presents with   Headache    Meredith [name] is a 42 y.o. female.  Pt is a 42y/o female with hx of iron deficiency, vitamin deficiency and hypothyroidism presenting today with c/o of HA now ongoing for 6 days.  She has a bandlike sensation around her head and behind the eyes but no fever, vision changes, unilateral weakness, numbness or trouble walking.  She has no neck pain or URI type sx.  Saw her PCP and given prednisone yesterday but no improvement and came here.  At home she has been taking ibuprofen which will help but then the HA returns.  She takes no hormone replacement or OCP's.  Hx of HA in the past but reports have never been this intense or long.  No other recent changes and no trauma.  She unfortunately has been waiting for 13 hours and had not had meds since yesterday and now HA is severe and she started to vomit while in the lobby.  The history is provided by the patient.  Headache      Home Medications Prior to Admission medications   Medication Sig Start Date End Date Taking? Authorizing Provider  cyanocobalamin (,VITAMIN B-12,) 1000 MCG/ML injection Inject into the muscle every 30 (thirty) days. Take depending on my levels    [provider]  ferrous sulfate 220 (44 Fe) MG/5ML solution Take 220 mg by mouth daily as needed (low iron). Injection administered biweekly/weekly depending on levels.    [provider]  ibuprofen (ADVIL) 200 MG tablet Take 200 mg by mouth every 6 (six) hours as needed for moderate pain.    [provider]  levothyroxine (SYNTHROID) 50 MCG tablet Take 50 mcg by mouth daily before breakfast.    [provider]  meloxicam (MOBIC) 15 MG tablet TAKE 1 TABLET BY MOUTH EVERY DAY FOR 14 DAYS 12/21/22   [provider]  ondansetron  (ZOFRAN-ODT) 4 MG disintegrating tablet Take 1 tablet (4 mg total) by mouth every 8 (eight) hours as needed for nausea or vomiting. 09/13/22   Jeannie Fend, PA-C  Vitamin D, Ergocalciferol, (DRISDOL) 1.25 MG (50000 UNIT) CAPS capsule TAKE 1 CAPSULE (50,000 UNITS TOTAL) BY MOUTH TWO TIMES A WEEK 12/18/21   Serena Croissant, MD      Allergies    Morphine and codeine, Promethazine, and Promethazine hcl    Review of Systems   Review of Systems  Neurological:  Positive for headaches.    Physical Exam Updated Vital Signs BP 110/78 (BP Location: Left Arm)   Pulse 81   Temp 98 F (36.7 C) (Oral)   Resp 16   Ht 5\' 3"  (1.6 m)   Wt 77.1 kg   SpO2 99%   BMI 30.11 kg/m  Physical Exam Vitals and nursing note reviewed.  Constitutional:      General: She is not in acute distress.    Appearance: She is well-developed.  HENT:     Head: Normocephalic and atraumatic.     Right Ear: Tympanic membrane normal.     Left Ear: Tympanic membrane normal.  Eyes:     Pupils: Pupils are equal, round, and reactive to light.     Comments: photophobia  Cardiovascular:     Rate and Rhythm: Normal rate and regular rhythm.     Heart sounds:  Normal heart sounds. No murmur heard.    No friction rub.  Pulmonary:     Effort: Pulmonary effort is normal.     Breath sounds: Normal breath sounds. No wheezing or rales.  Musculoskeletal:        General: No tenderness. Normal range of motion.     Comments: No edema  Skin:    General: Skin is warm and dry.     Findings: No rash.  Neurological:     General: No focal deficit present.     Mental Status: She is alert and oriented to person, place, and time. Mental status is at baseline.     Cranial Nerves: No cranial nerve deficit.     Sensory: No sensory deficit.     Motor: No weakness.     Coordination: Coordination normal.     Gait: Gait normal.  Psychiatric:        Behavior: Behavior normal.     ED Results / Procedures / Treatments   Labs (all labs  ordered are listed, but only abnormal results are displayed) Labs Reviewed  CBC - Abnormal; Notable for the following components:      Result Value   WBC 13.8 (*)    Platelets 403 (*)    All other components within normal limits  SARS CORONAVIRUS 2 BY RT PCR  BASIC METABOLIC PANEL  PREGNANCY, URINE    EKG None  Radiology CT Head Wo Contrast  Result Date: 02/09/2023 CLINICAL DATA:  Headache, increasing frequency or severity. EXAM: CT HEAD WITHOUT CONTRAST TECHNIQUE: Contiguous axial images were obtained from the base of the skull through the vertex without intravenous contrast. RADIATION DOSE REDUCTION: This exam was performed according to the departmental dose-optimization program which includes automated exposure control, adjustment of the mA and/or kV according to patient size and/or use of iterative reconstruction technique. COMPARISON:  02/13/2021. FINDINGS: Brain: No acute intracranial hemorrhage, midline shift or mass effect. No extra-axial fluid collection. Gray-white matter differentiation is within normal limits. No hydrocephalus. Vascular: No hyperdense vessel or unexpected calcification. Skull: Normal. Negative for fracture or focal lesion. Sinuses/Orbits: A small opacity is noted in the ethmoid air cells on the right. No acute orbital abnormality. Other: None. IMPRESSION: No acute intracranial process. Electronically Signed   By: Thornell Sartorius M.D.   On: 02/09/2023 21:41    Procedures Procedures    Medications Ordered in ED Medications  metoCLOPramide (REGLAN) injection 10 mg (10 mg Intravenous Given 02/10/23 0759)  lactated ringers bolus 1,000 mL (1,000 mLs Intravenous New Bag/Given 02/10/23 0759)  ketorolac (TORADOL) 15 MG/ML injection 15 mg (15 mg Intravenous Given 02/10/23 0759)    ED Course/ Medical Decision Making/ A&P                                 Medical Decision Making Amount and/or Complexity of Data Reviewed Labs: ordered. Decision-making details documented  in ED Course. Radiology: ordered and independent interpretation performed. Decision-making details documented in ED Course.  Risk Prescription drug management.   Pt with HA sounds suggestive of migraine without sx suggestive of SAH(sudden onset, worst of life, or deficits), infection, and lower suspicion for cavernous vein thrombosis.  Normal neuro exam and vital signs.  I independently interpreted labs and CBC with mild leukocytosis of 13 but pt recently started prednisone.  BMP wnl and covid and preg are neg.  I have independently visualized and interpreted pt's images today. Head CT neg  for bleed or mass. Will give HA cocktail and re-eval.  8:39 AM Sx are better.  Unclear cause for HA at this time.  Given return precautions and f/u with PcP         Final Clinical Impression(s) / ED Diagnoses Final diagnoses:  Acute intractable headache, unspecified headache type    Rx / DC Orders ED Discharge Orders     None         Gwyneth Sprout, MD 02/10/23 [phone]

## 2023-02-10 NOTE — Telephone Encounter (Signed)
Received secure chat below from Garwin Brothers, RN:  "[name] is scheduled for Venofer today at 1415. She was in the ED last night  with headache, nausea, light sensitivity, blurred vision and discharged this morning when Sx showed some relief. Just wanted to bring this to your attention in case you wanted to check in with her before her appt today."  Called Pt and LVM inquiring if sx still present and if she will be coming in for IV iron. Gave call back number and asked for call back.

## 2023-02-10 NOTE — Patient Instructions (Signed)
Iron Sucrose Injection What is this medication? IRON SUCROSE (EYE ern SOO krose) treats low levels of iron (iron deficiency anemia) in people with kidney disease. Iron is a mineral that plays an important role in making red blood cells, which carry oxygen from your lungs to the rest of your body. This medicine may be used for other purposes; ask your health care provider or pharmacist if you have questions. COMMON BRAND NAME(S): Venofer What should I tell my care team before I take this medication? They need to know if you have any of these conditions: Anemia not caused by low iron levels Heart disease High levels of iron in the blood Kidney disease Liver disease An unusual or allergic reaction to iron, other medications, foods, dyes, or preservatives Pregnant or trying to get pregnant Breastfeeding How should I use this medication? This medication is for infusion into a vein. It is given in a hospital or clinic setting. Talk to your care team about the use of this medication in children. While this medication may be prescribed for children as young as 2 years for selected conditions, precautions do apply. Overdosage: If you think you have taken too much of this medicine contact a poison control center or emergency room at once. NOTE: This medicine is only for you. Do not share this medicine with others. What if I miss a dose? Keep appointments for follow-up doses. It is important not to miss your dose. Call your care team if you are unable to keep an appointment. What may interact with this medication? Do not take this medication with any of the following: Deferoxamine Dimercaprol Other iron products This medication may also interact with the following: Chloramphenicol Deferasirox This list may not describe all possible interactions. Give your health care provider a list of all the medicines, herbs, non-prescription drugs, or dietary supplements you use. Also tell them if you smoke,  drink alcohol, or use illegal drugs. Some items may interact with your medicine. What should I watch for while using this medication? Visit your care team regularly. Tell your care team if your symptoms do not start to get better or if they get worse. You may need blood work done while you are taking this medication. You may need to follow a special diet. Talk to your care team. Foods that contain iron include: whole grains/cereals, dried fruits, beans, or peas, leafy green vegetables, and organ meats (liver, kidney). What side effects may I notice from receiving this medication? Side effects that you should report to your care team as soon as possible: Allergic reactions--skin rash, itching, hives, swelling of the face, lips, tongue, or throat Low blood pressure--dizziness, feeling faint or lightheaded, blurry vision Shortness of breath Side effects that usually do not require medical attention (report to your care team if they continue or are bothersome): Flushing Headache Joint pain Muscle pain Nausea Pain, redness, or irritation at injection site This list may not describe all possible side effects. Call your doctor for medical advice about side effects. You may report side effects to FDA at 1-800-FDA-1088. Where should I keep my medication? This medication is given in a hospital or clinic. It will not be stored at home. NOTE: This sheet is a summary. It may not cover all possible information. If you have questions about this medicine, talk to your doctor, pharmacist, or health care provider.  2024 Elsevier/Gold Standard (2022-12-04 00:00:00)

## 2023-02-10 NOTE — Discharge Instructions (Signed)
CAT scan today was normal.  Your white blood cell count was a little elevated but probably related to the prednisone you just started.  Get some rest and stay hydrated.  If you start having vision trouble, one sided weakness or numbness, trouble walking or persistent vomiting return to the ER.

## 2023-02-10 NOTE — Progress Notes (Signed)
Patient declined post infusion observation.  Tolerated treatment well without incident.  VSS at discharge.  Ambulated to lobby.

## 2023-02-11 ENCOUNTER — Encounter: Payer: Self-pay | Admitting: Hematology and Oncology

## 2023-02-11 ENCOUNTER — Inpatient Hospital Stay: Payer: Medicare Other

## 2023-02-16 ENCOUNTER — Telehealth: Payer: Self-pay

## 2023-02-16 NOTE — Telephone Encounter (Signed)
Called pt to make her aware [facility] does not accept her insurance. Referral faxed to [facility]. Pt made aware and verbalized thanks. Fax confirmation received.

## 2023-02-16 NOTE — Telephone Encounter (Signed)
Pt called to let us know she has not heard from [facility] in reference to her referral to their office. Faxed referral and all appropriate documents to [phone]. Fax confirmation received. Pt is aware and knows to call their office this week for appt.

## 2023-02-17 ENCOUNTER — Inpatient Hospital Stay: Payer: Medicare Other | Attending: Hematology and Oncology

## 2023-02-17 ENCOUNTER — Other Ambulatory Visit: Payer: Self-pay

## 2023-02-17 VITALS — BP 93/65 | HR 81 | Temp 98.3°F | Resp 16

## 2023-02-17 DIAGNOSIS — D510 Vitamin B12 deficiency anemia due to intrinsic factor deficiency: Secondary | ICD-10-CM | POA: Diagnosis not present

## 2023-02-17 DIAGNOSIS — D509 Iron deficiency anemia, unspecified: Secondary | ICD-10-CM | POA: Insufficient documentation

## 2023-02-17 DIAGNOSIS — E538 Deficiency of other specified B group vitamins: Secondary | ICD-10-CM

## 2023-02-17 MED ORDER — ACETAMINOPHEN 325 MG PO TABS
650.0000 mg | ORAL_TABLET | Freq: Once | ORAL | Status: AC
  Administered 2023-02-17: 650 mg via ORAL
  Filled 2023-02-17: qty 2

## 2023-02-17 MED ORDER — SODIUM CHLORIDE 0.9 % IV SOLN: Freq: Once | INTRAVENOUS | Status: AC

## 2023-02-17 MED ORDER — SODIUM CHLORIDE 0.9 % IV SOLN
300.0000 mg | Freq: Once | INTRAVENOUS | Status: AC
  Administered 2023-02-17: 300 mg via INTRAVENOUS
  Filled 2023-02-17: qty 300

## 2023-02-17 MED ORDER — CYANOCOBALAMIN 1000 MCG/ML IJ SOLN
1000.0000 ug | Freq: Once | INTRAMUSCULAR | Status: AC
  Administered 2023-02-17: 1000 ug via INTRAMUSCULAR
  Filled 2023-02-17: qty 1

## 2023-02-17 MED ORDER — DIPHENHYDRAMINE HCL 25 MG PO CAPS
25.0000 mg | ORAL_CAPSULE | Freq: Once | ORAL | Status: AC
  Administered 2023-02-17: 25 mg via ORAL
  Filled 2023-02-17: qty 1

## 2023-02-17 NOTE — Patient Instructions (Signed)
Iron Sucrose Injection What is this medication? IRON SUCROSE (EYE ern SOO krose) treats low levels of iron (iron deficiency anemia) in people with kidney disease. Iron is a mineral that plays an important role in making red blood cells, which carry oxygen from your lungs to the rest of your body. This medicine may be used for other purposes; ask your health care provider or pharmacist if you have questions. COMMON BRAND NAME(S): Venofer What should I tell my care team before I take this medication? They need to know if you have any of these conditions: Anemia not caused by low iron levels Heart disease High levels of iron in the blood Kidney disease Liver disease An unusual or allergic reaction to iron, other medications, foods, dyes, or preservatives Pregnant or trying to get pregnant Breastfeeding How should I use this medication? This medication is for infusion into a vein. It is given in a hospital or clinic setting. Talk to your care team about the use of this medication in children. While this medication may be prescribed for children as young as 2 years for selected conditions, precautions do apply. Overdosage: If you think you have taken too much of this medicine contact a poison control center or emergency room at once. NOTE: This medicine is only for you. Do not share this medicine with others. What if I miss a dose? Keep appointments for follow-up doses. It is important not to miss your dose. Call your care team if you are unable to keep an appointment. What may interact with this medication? Do not take this medication with any of the following: Deferoxamine Dimercaprol Other iron products This medication may also interact with the following: Chloramphenicol Deferasirox This list may not describe all possible interactions. Give your health care provider a list of all the medicines, herbs, non-prescription drugs, or dietary supplements you use. Also tell them if you smoke,  drink alcohol, or use illegal drugs. Some items may interact with your medicine. What should I watch for while using this medication? Visit your care team regularly. Tell your care team if your symptoms do not start to get better or if they get worse. You may need blood work done while you are taking this medication. You may need to follow a special diet. Talk to your care team. Foods that contain iron include: whole grains/cereals, dried fruits, beans, or peas, leafy green vegetables, and organ meats (liver, kidney). What side effects may I notice from receiving this medication? Side effects that you should report to your care team as soon as possible: Allergic reactions--skin rash, itching, hives, swelling of the face, lips, tongue, or throat Low blood pressure--dizziness, feeling faint or lightheaded, blurry vision Shortness of breath Side effects that usually do not require medical attention (report to your care team if they continue or are bothersome): Flushing Headache Joint pain Muscle pain Nausea Pain, redness, or irritation at injection site This list may not describe all possible side effects. Call your doctor for medical advice about side effects. You may report side effects to FDA at 1-800-FDA-1088. Where should I keep my medication? This medication is given in a hospital or clinic. It will not be stored at home. NOTE: This sheet is a summary. It may not cover all possible information. If you have questions about this medicine, talk to your doctor, pharmacist, or health care provider.  2024 Elsevier/Gold Standard (2022-12-04 00:00:00)

## 2023-02-17 NOTE — Progress Notes (Signed)
Pt observed for 20 minutes post Venofer infusion. Pt tolerated Tx well w/out incident. VSS at discharge.  Ambulatory to lobby.

## 2023-02-18 ENCOUNTER — Inpatient Hospital Stay: Payer: Medicare Other

## 2023-02-24 ENCOUNTER — Inpatient Hospital Stay: Payer: Medicare Other

## 2023-02-24 ENCOUNTER — Other Ambulatory Visit: Payer: Self-pay

## 2023-02-24 VITALS — BP 113/60 | HR 82 | Temp 98.4°F | Resp 16

## 2023-02-24 DIAGNOSIS — E538 Deficiency of other specified B group vitamins: Secondary | ICD-10-CM

## 2023-02-24 DIAGNOSIS — D509 Iron deficiency anemia, unspecified: Secondary | ICD-10-CM | POA: Diagnosis not present

## 2023-02-24 MED ORDER — DIPHENHYDRAMINE HCL 25 MG PO CAPS
25.0000 mg | ORAL_CAPSULE | Freq: Once | ORAL | Status: AC
  Administered 2023-02-24: 25 mg via ORAL
  Filled 2023-02-24: qty 1

## 2023-02-24 MED ORDER — SODIUM CHLORIDE 0.9 % IV SOLN
300.0000 mg | Freq: Once | INTRAVENOUS | Status: AC
  Administered 2023-02-24: 300 mg via INTRAVENOUS
  Filled 2023-02-24: qty 300

## 2023-02-24 MED ORDER — ACETAMINOPHEN 325 MG PO TABS
650.0000 mg | ORAL_TABLET | Freq: Once | ORAL | Status: AC
  Administered 2023-02-24: 650 mg via ORAL
  Filled 2023-02-24: qty 2

## 2023-02-24 MED ORDER — SODIUM CHLORIDE 0.9 % IV SOLN: Freq: Once | INTRAVENOUS | Status: AC

## 2023-02-24 NOTE — Progress Notes (Signed)
Pt declined to stay for full 30 min post obs, discharged with VSS, ambulatory to lobby.

## 2023-02-24 NOTE — Patient Instructions (Signed)
 Iron Sucrose Injection What is this medication? IRON SUCROSE (EYE ern SOO krose) treats low levels of iron (iron deficiency anemia) in people with kidney disease. Iron is a mineral that plays an important role in making red blood cells, which carry oxygen from your lungs to the rest of your body. This medicine may be used for other purposes; ask your health care provider or pharmacist if you have questions. COMMON BRAND NAME(S): Venofer What should I tell my care team before I take this medication? They need to know if you have any of these conditions: Anemia not caused by low iron levels Heart disease High levels of iron in the blood Kidney disease Liver disease An unusual or allergic reaction to iron, other medications, foods, dyes, or preservatives Pregnant or trying to get pregnant Breastfeeding How should I use this medication? This medication is for infusion into a vein. It is given in a hospital or clinic setting. Talk to your care team about the use of this medication in children. While this medication may be prescribed for children as young as 2 years for selected conditions, precautions do apply. Overdosage: If you think you have taken too much of this medicine contact a poison control center or emergency room at once. NOTE: This medicine is only for you. Do not share this medicine with others. What if I miss a dose? Keep appointments for follow-up doses. It is important not to miss your dose. Call your care team if you are unable to keep an appointment. What may interact with this medication? Do not take this medication with any of the following: Deferoxamine Dimercaprol Other iron products This medication may also interact with the following: Chloramphenicol Deferasirox This list may not describe all possible interactions. Give your health care provider a list of all the medicines, herbs, non-prescription drugs, or dietary supplements you use. Also tell them if you smoke,  drink alcohol, or use illegal drugs. Some items may interact with your medicine. What should I watch for while using this medication? Visit your care team regularly. Tell your care team if your symptoms do not start to get better or if they get worse. You may need blood work done while you are taking this medication. You may need to follow a special diet. Talk to your care team. Foods that contain iron include: whole grains/cereals, dried fruits, beans, or peas, leafy green vegetables, and organ meats (liver, kidney). What side effects may I notice from receiving this medication? Side effects that you should report to your care team as soon as possible: Allergic reactions--skin rash, itching, hives, swelling of the face, lips, tongue, or throat Low blood pressure--dizziness, feeling faint or lightheaded, blurry vision Shortness of breath Side effects that usually do not require medical attention (report to your care team if they continue or are bothersome): Flushing Headache Joint pain Muscle pain Nausea Pain, redness, or irritation at injection site This list may not describe all possible side effects. Call your doctor for medical advice about side effects. You may report side effects to FDA at 1-800-FDA-1088. Where should I keep my medication? This medication is given in a hospital or clinic. It will not be stored at home. NOTE: This sheet is a summary. It may not cover all possible information. If you have questions about this medicine, talk to your doctor, pharmacist, or health care provider.  2024 Elsevier/Gold Standard (2022-12-04 00:00:00)

## 2023-03-13 ENCOUNTER — Other Ambulatory Visit: Payer: Self-pay | Admitting: Nurse Practitioner

## 2023-03-13 DIAGNOSIS — Z1231 Encounter for screening mammogram for malignant neoplasm of breast: Secondary | ICD-10-CM

## 2023-03-17 ENCOUNTER — Ambulatory Visit
Admission: RE | Admit: 2023-03-17 | Discharge: 2023-03-17 | Disposition: A | Discharge status: Home / Self Care | Payer: Medicare Other | Source: Ambulatory Visit | Attending: Nurse Practitioner | Admitting: Nurse Practitioner

## 2023-03-17 DIAGNOSIS — Z1231 Encounter for screening mammogram for malignant neoplasm of breast: Secondary | ICD-10-CM

## 2023-03-18 ENCOUNTER — Inpatient Hospital Stay: Payer: Medicare Other | Attending: Hematology and Oncology

## 2023-03-18 DIAGNOSIS — E538 Deficiency of other specified B group vitamins: Secondary | ICD-10-CM

## 2023-03-18 DIAGNOSIS — D510 Vitamin B12 deficiency anemia due to intrinsic factor deficiency: Secondary | ICD-10-CM | POA: Insufficient documentation

## 2023-03-18 MED ORDER — CYANOCOBALAMIN 1000 MCG/ML IJ SOLN
1000.0000 ug | Freq: Once | INTRAMUSCULAR | Status: AC
  Administered 2023-03-18: 1000 ug via INTRAMUSCULAR
  Filled 2023-03-18: qty 1

## 2023-04-13 ENCOUNTER — Inpatient Hospital Stay: Payer: Medicare Other | Attending: Hematology and Oncology

## 2023-04-13 DIAGNOSIS — D509 Iron deficiency anemia, unspecified: Secondary | ICD-10-CM | POA: Diagnosis present

## 2023-04-13 DIAGNOSIS — E538 Deficiency of other specified B group vitamins: Secondary | ICD-10-CM | POA: Diagnosis present

## 2023-04-13 DIAGNOSIS — E559 Vitamin D deficiency, unspecified: Secondary | ICD-10-CM | POA: Insufficient documentation

## 2023-04-13 LAB — CBC WITH DIFFERENTIAL (CANCER CENTER ONLY)
Abs Immature Granulocytes: 0.03 10*3/uL (ref 0.00–0.07)
Basophils Absolute: 0.1 10*3/uL (ref 0.0–0.1)
Basophils Relative: 1 %
Eosinophils Absolute: 0.2 10*3/uL (ref 0.0–0.5)
Eosinophils Relative: 2 %
HCT: 40.7 % (ref 36.0–46.0)
Hemoglobin: 13.2 g/dL (ref 12.0–15.0)
Immature Granulocytes: 0 %
Lymphocytes Relative: 25 %
Lymphs Abs: 2.1 10*3/uL (ref 0.7–4.0)
MCH: 29.1 pg (ref 26.0–34.0)
MCHC: 32.4 g/dL (ref 30.0–36.0)
MCV: 89.6 fL (ref 80.0–100.0)
Monocytes Absolute: 0.5 10*3/uL (ref 0.1–1.0)
Monocytes Relative: 6 %
Neutro Abs: 5.5 10*3/uL (ref 1.7–7.7)
Neutrophils Relative %: 66 %
Platelet Count: 328 10*3/uL (ref 150–400)
RBC: 4.54 MIL/uL (ref 3.87–5.11)
RDW: 13.7 % (ref 11.5–15.5)
WBC Count: 8.5 10*3/uL (ref 4.0–10.5)
nRBC: 0 % (ref 0.0–0.2)

## 2023-04-13 LAB — CMP (CANCER CENTER ONLY)
ALT: 10 U/L (ref 0–44)
AST: 10 U/L — ABNORMAL LOW (ref 15–41)
Albumin: 4.2 g/dL (ref 3.5–5.0)
Alkaline Phosphatase: 66 U/L (ref 38–126)
Anion gap: 6 (ref 5–15)
BUN: 12 mg/dL (ref 6–20)
CO2: 29 mmol/L (ref 22–32)
Calcium: 9.6 mg/dL (ref 8.9–10.3)
Chloride: 106 mmol/L (ref 98–111)
Creatinine: 0.79 mg/dL (ref 0.44–1.00)
GFR, Estimated: >60 mL/min (ref >60)
Glucose, Bld: 100 mg/dL — ABNORMAL HIGH (ref 70–99)
Potassium: 3.9 mmol/L (ref 3.5–5.1)
Sodium: 141 mmol/L (ref 135–145)
Total Bilirubin: 0.3 mg/dL (ref 0.3–1.2)
Total Protein: 6.9 g/dL (ref 6.5–8.1)

## 2023-04-13 LAB — IRON AND IRON BINDING CAPACITY (CC-WL,HP ONLY)
Iron: 56 ug/dL (ref 28–170)
Saturation Ratios: 16 % (ref 10.4–31.8)
TIBC: 360 ug/dL (ref 250–450)
UIBC: 304 ug/dL (ref 148–442)

## 2023-04-13 LAB — FERRITIN: Ferritin: 50 ng/mL (ref 11–307)

## 2023-04-15 ENCOUNTER — Inpatient Hospital Stay: Payer: Medicare Other

## 2023-04-15 ENCOUNTER — Inpatient Hospital Stay: Payer: Medicare Other | Admitting: Hematology and Oncology

## 2023-04-15 VITALS — BP 116/73 | HR 78 | Temp 97.3°F | Resp 18 | Ht 63.0 in | Wt 175.1 lb

## 2023-04-15 DIAGNOSIS — E538 Deficiency of other specified B group vitamins: Secondary | ICD-10-CM

## 2023-04-15 DIAGNOSIS — D509 Iron deficiency anemia, unspecified: Secondary | ICD-10-CM

## 2023-04-15 MED ORDER — CYANOCOBALAMIN 1000 MCG/ML IJ SOLN
1000.0000 ug | Freq: Once | INTRAMUSCULAR | Status: AC
  Administered 2023-04-15: 1000 ug via INTRAMUSCULAR
  Filled 2023-04-15: qty 1

## 2023-04-15 NOTE — Progress Notes (Signed)
Patient Care Team: Soundra Pilon, FNP as PCP - General (Family Medicine)  DIAGNOSIS:  Encounter Diagnosis  Name Primary?   Iron deficiency anemia, unspecified iron deficiency anemia type Yes    CHIEF COMPLIANT: Follow-up of iron deficiency anemia and B12 deficiency  Discussed the use of AI scribe software for clinical note transcription with the patient, who gave verbal consent to proceed.  History of Present Illness   The patient, with a history of anemia, presents for follow-up after receiving an iron infusion six weeks ago. She reports no change in her symptoms despite the treatment. She also mentions experiencing back and tailbone pain. 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 The results of the MRI are still pending.  In addition to her anemia and back pain, the patient has been feeling generally unwell, though she is unable to pinpoint any specific symptoms. She expresses concern that her anemia and potential arthritis could be contributing to her overall malaise.       ALLERGIES:  is allergic to morphine and codeine, promethazine, and promethazine hcl.  MEDICATIONS:  Current Outpatient Medications  Medication Sig Dispense Refill   cyanocobalamin (,VITAMIN B-12,) 1000 MCG/ML injection Inject into the muscle every 30 (thirty) days. Take depending on my levels     ferrous sulfate 220 (44 Fe) MG/5ML solution Take 220 mg by mouth daily as needed (low iron). Injection administered biweekly/weekly depending on levels.     ibuprofen (ADVIL) 200 MG tablet Take 200 mg by mouth every 6 (six) hours as needed for moderate pain.     levothyroxine (SYNTHROID) 50 MCG tablet Take 50 mcg by mouth daily before breakfast.     ondansetron (ZOFRAN-ODT) 4 MG disintegrating tablet Take 1 tablet (4 mg total) by mouth every 8 (eight) hours as needed for nausea or vomiting. 12 tablet 0   Vitamin D, Ergocalciferol,  (DRISDOL) 1.25 MG (50000 UNIT) CAPS capsule TAKE 1 CAPSULE (50,000 UNITS TOTAL) BY MOUTH TWO TIMES A WEEK 24 capsule 3   No current facility-administered medications for this visit.    PHYSICAL EXAMINATION: ECOG PERFORMANCE STATUS: 1 - Symptomatic but completely ambulatory  Vitals:   04/15/23 0904  BP: 116/73  Pulse: 78  Resp: 18  Temp: (!) 97.3 F (36.3 C)  SpO2: 100%   Filed Weights   04/15/23 0904  Weight: 175 lb 1.6 oz (79.4 kg)     LABORATORY DATA:  I have reviewed the data as listed    Latest Ref Rng & Units 04/13/2023    9:51 AM 02/09/2023    9:29 PM 09/13/2022    2:39 AM  CMP  Glucose 70 - 99 mg/dL 161  88  096   BUN 6 - 20 mg/dL 12  17  15    Creatinine 0.44 - 1.00 mg/dL 0.45  4.09  8.11   Sodium 135 - 145 mmol/L 141  138  137   Potassium 3.5 - 5.1 mmol/L 3.9  4.0  3.5   Chloride 98 - 111 mmol/L 106  103  106   CO2 22 - 32 mmol/L 29  26  25    Calcium 8.9 - 10.3 mg/dL 9.6  9.6  8.8   Total Protein 6.5 - 8.1 g/dL 6.9   6.5   Total Bilirubin 0.3 - 1.2 mg/dL 0.3   0.3   Alkaline Phos 38 - 126 U/L 66   57   AST 15 - 41 U/L  10   15   ALT 0 - 44 U/L 10   14     Lab Results  Component Value Date   WBC 8.5 04/13/2023   HGB 13.2 04/13/2023   HCT 40.7 04/13/2023   MCV 89.6 04/13/2023   PLT 328 04/13/2023   NEUTROABS 5.5 04/13/2023    ASSESSMENT & PLAN:  Iron deficiency anemia Bone marrow biopsy 07/03/2020: 50 to 60% cellularity, normocellular bone marrow with trilineage hematopoiesis.  No dyspoietic or dysplastic changes noted Cytogenetics Neg Recommendation: Sublingual B12 5000 mcg twice a week Severe vitamin D deficiency:  50,000 units of vitamin D twice a week.  Vitamin D levels continue to be low   Severe and profound fatigue: She applied for disability and it was denied. Lab review:  06/25/2021: WBC 12.2, MCV 85.9, ferritin 4, iron saturation 9%, B12 506, folate > 5.9  09/23/2021: Hemoglobin 13.3, MCV 87, B12 466,  iron studies are normal with a ferritin  of 36 12/19/21: Iron Sat: 7%, Vit D 25.93, Hb 13.6, Ferritin 6  03/20/2022: Hemoglobin 12.8, MCV 87.4, B12 383, iron saturation 6%, ferritin 52 07/07/2022: Hemoglobin 13.2, MCV 86.6, vitamin D23.51, B12 422, iron saturation 8%, ferritin 6 10/26/2022: Hemoglobin 13.9, MCV 87.4, vitamin D29.4, B12 470, iron saturation 16%, ferritin 37 01/12/2023: Hemoglobin 12.5 MCV 89.5, iron saturation 14%, ferritin 770 and 132, zinc 82, copper 95 (normal), B12 564, vitamin D 29.49 04/13/2023: Hemoglobin 13.2, iron saturation 16%, ferritin 50   IV Iron: 06/2021, June 2023, January 2024, July 2024   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 Could be autoimmune in nature. She cannot work or function on a day to day basis because of this condition.   Continue with monthly B12 injections No role of additional IV iron at this time.   Back Pain Patient reports new onset back pain and tailbone pain. 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 MRI results pending. -Follow-up with orthopedic specialist for MRI results and further management.  General Health Maintenance -Continue B12 shots as scheduled. -Follow-up appointment scheduled for January 11-12, 2025.        Orders Placed This Encounter  Procedures   CBC with Differential ([facility] Only)    Standing Status:   Future    Standing Expiration Date:   04/14/2024   Ferritin    Standing Status:   Future    Standing Expiration Date:   04/14/2024   Iron and Iron Binding Capacity ([facility] only)    Standing Status:   Future    Standing Expiration Date:   04/14/2024   Vitamin B12    Standing Status:   Future    Standing Expiration Date:   04/14/2024   CMP (Cancer Center only)    Standing Status:   Future    Standing Expiration Date:   04/14/2024   The patient has a good understanding of the overall plan. she agrees with it. she will call with any problems that may develop before the next visit here. Total time spent: 30 mins  including face to face time and time spent for planning, charting and co-ordination of care   Tamsen Meek, MD 04/15/23

## 2023-04-15 NOTE — Assessment & Plan Note (Signed)
Bone marrow biopsy 07/03/2020: 50 to 60% cellularity, normocellular bone marrow with trilineage hematopoiesis.  No dyspoietic or dysplastic changes noted Cytogenetics Neg Recommendation: Sublingual B12 5000 mcg twice a week Severe vitamin D deficiency:  50,000 units of vitamin D twice a week.  Vitamin D levels continue to be low   Severe and profound fatigue: She applied for disability and it was denied. Lab review:  06/25/2021: WBC 12.2, MCV 85.9, ferritin 4, iron saturation 9%, B12 506, folate > 5.9  09/23/2021: Hemoglobin 13.3, MCV 87, B12 466,  iron studies are normal with a ferritin of 36 12/19/21: Iron Sat: 7%, Vit D 25.93, Hb 13.6, Ferritin 6  03/20/2022: Hemoglobin 12.8, MCV 87.4, B12 383, iron saturation 6%, ferritin 52 07/07/2022: Hemoglobin 13.2, MCV 86.6, vitamin D23.51, B12 422, iron saturation 8%, ferritin 6 10/26/2022: Hemoglobin 13.9, MCV 87.4, vitamin D29.4, B12 470, iron saturation 16%, ferritin 37 01/12/2023: Hemoglobin 12.5 MCV 89.5, iron saturation 14%, ferritin 770 and 132, zinc 82, copper 95 (normal), B12 564, vitamin D 29.49 04/13/2023: Hemoglobin 13.2, iron saturation 16%, ferritin 50   IV Iron: 06/2021, June 2023, January 2024, July 2024   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 Could be autoimmune in nature. She cannot work or function on a day to day basis because of this condition.   Continue with monthly B12 injections No role of additional IV iron at this time.  Recheck labs in 3 months and follow-up after that.

## 2023-04-30 NOTE — Progress Notes (Signed)
Office Visit Note  Patient: Meredith [name]             Date of Birth: 03-01-1981           MRN: [id number]             PCP: Soundra Pilon, FNP Referring: Soundra Pilon, FNP Visit Date: 05/14/2023 Occupation: @GUAROCC @  Subjective:  Pain in multiple joints and muscles   History of Present Illness: Yaritsa D Meredith [name] is a 42 y.o. female seen in consultation per request of her PCP.  According the patient she was diagnosed with chronic fatigue syndrome several years ago by her hematologist.  She experiences fatigue since 2020.  She states for the last few years she has been also experiencing pain and discomfort in multiple joints.  She states the pain usually moves around.  The pain started in her right lower extremity and then moved to other areas.  She is also noticed decreased grip strength.  She has had lower back pain for the last 4 years for which she has been going to [facility].  She was diagnosed with degenerative disc disease of lumbar spine by Dr. Penni Bombard.  She states she also has left knee joint discomfort and had x-rays followed by MRI.  She was told that she has a cyst in her knee.  She also had x-rays of her hands which showed some changes according to the patient.  She describes pain and discomfort in the bilateral trapezius region, lower back, occasional discomfort in her elbows she has discomfort in her hands and her wrist joints.  She describes discomfort in the trochanteric region more prominent on the right side pain in her both knees more prominent on the right side pain in her ankles and feet.  She has noticed intermittent swelling in her knees and her ankles.  She has not received any treatment to [facility] yet.  She gives history of fatigue, occasional oral ulcers, hair loss, photosensitivity and occasional rash.  There is no history of Raynaud's phenomenon, swollen glands, sicca symptoms, palpitations.  She was evaluated by pulmonologist in the past for lung  nodules.  There is questionable history of rheumatoid arthritis in maternal grandmother.  She is gravida 3, para 3.  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  She is on disability now.    Activities of Daily Living:  Patient reports morning stiffness for less than 5 minutes.   Patient Reports nocturnal pain.  Difficulty dressing/grooming: Denies Difficulty climbing stairs: Reports Difficulty getting out of chair: Denies Difficulty using hands for taps, buttons, cutlery, and/or writing: Reports  Review of Systems  Constitutional:  Positive for fatigue.  HENT:  Positive for mouth sores. Negative for mouth dryness.   Eyes:  Negative for dryness.  Respiratory:  Negative for difficulty breathing.   Cardiovascular:  Negative for chest pain and palpitations.  Gastrointestinal:  Positive for constipation and diarrhea. Negative for blood in stool.  Endocrine: Negative for increased urination.  Genitourinary:  Negative for involuntary urination.  Musculoskeletal:  Positive for joint pain, gait problem, joint pain, joint swelling, myalgias, muscle weakness, morning stiffness, muscle tenderness and myalgias.  Skin:  Positive for rash, hair loss and sensitivity to sunlight. Negative for color change.  Allergic/Immunologic: Negative for susceptible to infections.  Neurological:  Positive for headaches. Negative for dizziness.  Hematological:  Negative for swollen glands.  Psychiatric/Behavioral:  Negative for depressed mood  and sleep disturbance. The patient is not nervous/anxious.     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    Chronic fatigue syndrome    DDD (degenerative disc disease), lumbar    Deficiency of other vitamins    Vitamin B6   Hypothyroidism    Hypothyroidism    Iron deficiency    Iron deficiency anemia    pernicious anemia   Lymphocytic gastritis    Malabsorption    Osteoarthritis    Pyelonephritis 06/21/2011   10 kidney stones surgically removed   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, none since   Vitamin B 12 deficiency    Vitamin D deficiency     Family History  Problem Relation Age of Onset   Allergic rhinitis Mother    Urolithiasis Mother    Sinusitis Mother    Eczema Daughter    Breast cancer Paternal Aunt    Hypertension Maternal Grandmother    Thyroid disease Maternal Grandmother    Dementia Maternal Grandmother    Heart disease Maternal Grandfather    Diabetes Maternal Grandfather    Eczema Son    Allergic rhinitis Son    Allergic rhinitis Son    Asthma Neg Hx    Immunodeficiency Neg Hx    Angioedema Neg Hx    Past Surgical History:  Procedure Laterality Date   CESAREAN SECTION  07/2007   CESAREAN SECTION WITH BILATERAL TUBAL LIGATION N/A 04/07/2014   Procedure: CESAREAN SECTION WITH BILATERAL TUBAL LIGATION;  Surgeon: Sharon Seller, DO;  Location: [facility];  Service: Obstetrics;  Laterality: N/A;   COLONOSCOPY W/ ENDOSCOPIC [place]     cyst removal thumb Right    mirena iud N/A 2009   NEPHROLITHOTOMY  08/03/2011   Procedure: NEPHROLITHOTOMY PERCUTANEOUS;  Surgeon: Kathi Ludwig, MD;  Location: [facility];  Service: Urology;  Laterality: Left;   NEPHROSTOMY  08/03/2011   left   right wrist surgery  2005   I and D , infection to the bone   WISDOM TOOTH EXTRACTION  2006   Social History   Social History Narrative   Lives with family   Immunization History  Administered Date(s) Administered   Influenza, Seasonal, Injecte, Preservative Fre  08/24/2012   Influenza,inj,Quad PF,6+ Mos 04/08/2014, 05/02/2018   Influenza-Unspecified 04/26/2019   PPD Test 03/03/2012   Tdap 10/03/2010     Objective: Vital Signs: BP 110/75 (BP Location: Right Arm, Patient Position: Sitting, Cuff Size: Normal)   Pulse (!) 101   Resp 14   Ht 5\' 2"  (1.575 m)   Wt 173 lb (78.5 kg)   LMP 04/27/2023   BMI 31.64 kg/m    Physical Exam Vitals and nursing note reviewed.  Constitutional:      Appearance: She is well-developed.  HENT:     Head: Normocephalic and atraumatic.  Eyes:     Conjunctiva/sclera: Conjunctivae normal.  Cardiovascular:     Rate and Rhythm: Normal rate and regular rhythm.     Heart sounds: Normal heart sounds.  Pulmonary:     Effort: Pulmonary effort is normal.  Breath sounds: Normal breath sounds.  Abdominal:     General: Bowel sounds are normal.     Palpations: Abdomen is soft.  Musculoskeletal:     Cervical back: Normal range of motion.  Lymphadenopathy:     Cervical: No cervical adenopathy.  Skin:    General: Skin is warm and dry.     Capillary Refill: Capillary refill takes less than 2 seconds.  Neurological:     Mental Status: She is alert and oriented to person, place, and time.  Psychiatric:        Behavior: Behavior normal.      Musculoskeletal Exam: Cervical, thoracic and lumbar spine with good range of motion.  She has some discomfort range of motion lumbar spine.  Shoulder joints, elbow joints, wrist joints, MCPs PIPs and DIPs with good range of motion with no synovitis.  Hip joints with good range of motion.  She had tenderness over bilateral trochanteric bursa.  Knee joints and ankle joints were in good range of motion without any warmth swelling or effusion.  There was no tenderness over ankles or MTPs.  CDAI Exam: CDAI Score: -- Patient Global: --; Provider Global: -- Swollen: --; Tender: -- Joint Exam 05/14/2023   No joint exam has been documented for this visit   There is currently no  information documented on the homunculus. Go to the Rheumatology activity and complete the homunculus joint exam.  Investigation: No additional findings.  Imaging: No results found.  Recent Labs: Lab Results  Component Value Date   WBC 8.5 04/13/2023   HGB 13.2 04/13/2023   PLT 328 04/13/2023   NA 141 04/13/2023   K 3.9 04/13/2023   CL 106 04/13/2023   CO2 29 04/13/2023   GLUCOSE 100 (H) 04/13/2023   BUN 12 04/13/2023   CREATININE 0.79 04/13/2023   BILITOT 0.3 04/13/2023   ALKPHOS 66 04/13/2023   AST 10 (L) 04/13/2023   ALT 10 04/13/2023   PROT 6.9 04/13/2023   ALBUMIN 4.2 04/13/2023   CALCIUM 9.6 04/13/2023   GFRAA >60 10/27/2019   March 19, 2020 RF negative, anti-CCP negative, vitamin D 31.66, B12 normal, ferritin 8, iron saturation 12   Show images for DG Knee Complete 4 Views Right Study Result  Narrative & Impression  CLINICAL DATA:  Traumatic posterior right knee pain.   EXAM: RIGHT KNEE - COMPLETE 4+ VIEW   COMPARISON:  None Available.   FINDINGS: No evidence of fracture, dislocation, or joint effusion. No evidence of arthropathy or other focal bone abnormality. Soft tissues are unremarkable.   IMPRESSION: Negative.     Electronically Signed   By: Ted Mcalpine M.D.   On: 01/17/2022 16:28   August 02, 2020 total body bone scan normal   Speciality Comments: No specialty comments available.  Procedures:  No procedures performed Allergies: Morphine and codeine, Promethazine, and Promethazine hcl   Assessment / Plan:     Visit Diagnoses: Polyarthralgia-patient complains of pain and discomfort in multiple joints over the last few years.  She complains of episodic pain.  She gives history of intermittent swelling in her hands and her knees and her ankles.  No synovitis was noted on the examination today.  Pain in both hands -she complains of pain and discomfort in the bilateral hands.  No synovitis was noted.  I will obtain x-rays and  labs today.  Plan: XR Hand 2 View Right, XR Hand 2 View Left, x-rays of bilateral hands were unremarkable.  Rheumatoid factor, Cyclic citrul peptide  antibody, IgG, ANA, Sedimentation rate  Chronic pain of right knee-she gives of intermittent pain and discomfort in the right knee joint.  No warmth swelling or effusion was noted.  Patient had extensive workup at [facility] per patient.  She also had MRI of her knee joint.  She was told that she has a cyst on her knee.  Pain in both feet -she complains of pain in her ankles and her feet.  She has been followed by [facility].  There is no plantar fasciitis or Achilles tendinitis on the examination.  Plan: XR Foot 2 Views Right, XR Foot 2 Views Left.  X-rays of bilateral feet were unremarkable except for left first MTP valgus.  Degeneration of intervertebral disc of lumbar region without discogenic back pain or lower extremity pain-patient states she was diagnosed with degenerative disc disease of the lumbar spine by [facility] based on the x-rays and MRI.  She was also advised to get cortisone injection in the future.  She has chronic discomfort.  Myalgia -she gives history of episodic increased pain in her muscles.  Had possibility of fibromyalgia syndrome was discussed with the patient.  Need for stretching exercises and regular exercise was emphasized.  Plan: CK  Mixed hyperlipidemia  Chronic fatigue syndrome -according to patient she was dxd and 2023 by her hematologist -she gives history of chronic fatigue.  Plan: TSH  Hypersomnolence  Fatty liver  Pernicious anemia  B12 deficiency  Iron deficiency anemia, unspecified iron deficiency anemia type  Vitamin D deficiency - Plan: VITAMIN D 25 Hydroxy (Vit-D Deficiency, Fractures)  Acquired hypothyroidism  Adenomatous polyp of colon, unspecified part of colon  Nephrolithiasis  Morbid (severe) obesity due to excess calories (HCC)  Orders: Orders Placed This Encounter  Procedures    XR Hand 2 View Right   XR Hand 2 View Left   XR Foot 2 Views Right   XR Foot 2 Views Left   CK   TSH   VITAMIN D 25 Hydroxy (Vit-D Deficiency, Fractures)   Rheumatoid factor   Cyclic citrul peptide antibody, IgG   ANA   Sedimentation rate   No orders of the defined types were placed in this encounter.    Follow-Up Instructions: Return for Polyarthralgia, myalgia.   Pollyann Savoy, MD  Note - This record has been created using Animal nutritionist.  Chart creation errors have been sought, but may not always  have been located. Such creation errors do not reflect on  the standard of medical care.

## 2023-05-14 ENCOUNTER — Ambulatory Visit: Payer: Medicare Other

## 2023-05-14 ENCOUNTER — Ambulatory Visit: Payer: Medicare Other | Attending: Rheumatology | Admitting: Rheumatology

## 2023-05-14 ENCOUNTER — Encounter: Payer: Self-pay | Admitting: Rheumatology

## 2023-05-14 VITALS — BP 110/75 | HR 101 | Resp 14 | Ht 62.0 in | Wt 173.0 lb

## 2023-05-14 DIAGNOSIS — M79671 Pain in right foot: Secondary | ICD-10-CM

## 2023-05-14 DIAGNOSIS — M79641 Pain in right hand: Secondary | ICD-10-CM | POA: Insufficient documentation

## 2023-05-14 DIAGNOSIS — E538 Deficiency of other specified B group vitamins: Secondary | ICD-10-CM | POA: Diagnosis present

## 2023-05-14 DIAGNOSIS — M51369 Other intervertebral disc degeneration, lumbar region without mention of lumbar back pain or lower extremity pain: Secondary | ICD-10-CM | POA: Insufficient documentation

## 2023-05-14 DIAGNOSIS — E039 Hypothyroidism, unspecified: Secondary | ICD-10-CM | POA: Insufficient documentation

## 2023-05-14 DIAGNOSIS — D510 Vitamin B12 deficiency anemia due to intrinsic factor deficiency: Secondary | ICD-10-CM | POA: Insufficient documentation

## 2023-05-14 DIAGNOSIS — G9332 Myalgic encephalomyelitis/chronic fatigue syndrome: Secondary | ICD-10-CM | POA: Diagnosis present

## 2023-05-14 DIAGNOSIS — M2550 Pain in unspecified joint: Secondary | ICD-10-CM | POA: Insufficient documentation

## 2023-05-14 DIAGNOSIS — M79672 Pain in left foot: Secondary | ICD-10-CM | POA: Insufficient documentation

## 2023-05-14 DIAGNOSIS — N200 Calculus of kidney: Secondary | ICD-10-CM | POA: Insufficient documentation

## 2023-05-14 DIAGNOSIS — M79642 Pain in left hand: Secondary | ICD-10-CM | POA: Insufficient documentation

## 2023-05-14 DIAGNOSIS — M791 Myalgia, unspecified site: Secondary | ICD-10-CM | POA: Insufficient documentation

## 2023-05-14 DIAGNOSIS — K760 Fatty (change of) liver, not elsewhere classified: Secondary | ICD-10-CM | POA: Insufficient documentation

## 2023-05-14 DIAGNOSIS — G4710 Hypersomnia, unspecified: Secondary | ICD-10-CM | POA: Diagnosis present

## 2023-05-14 DIAGNOSIS — D126 Benign neoplasm of colon, unspecified: Secondary | ICD-10-CM | POA: Insufficient documentation

## 2023-05-14 DIAGNOSIS — D509 Iron deficiency anemia, unspecified: Secondary | ICD-10-CM | POA: Diagnosis present

## 2023-05-14 DIAGNOSIS — E559 Vitamin D deficiency, unspecified: Secondary | ICD-10-CM | POA: Insufficient documentation

## 2023-05-14 DIAGNOSIS — M25561 Pain in right knee: Secondary | ICD-10-CM | POA: Insufficient documentation

## 2023-05-14 DIAGNOSIS — E782 Mixed hyperlipidemia: Secondary | ICD-10-CM | POA: Diagnosis present

## 2023-05-14 DIAGNOSIS — R5383 Other fatigue: Secondary | ICD-10-CM

## 2023-05-14 DIAGNOSIS — G8929 Other chronic pain: Secondary | ICD-10-CM | POA: Diagnosis present

## 2023-05-17 LAB — SEDIMENTATION RATE: Sed Rate: 9 mm/h (ref 0–20)

## 2023-05-17 LAB — ANA: Anti Nuclear Antibody (ANA): NEGATIVE

## 2023-05-17 LAB — RHEUMATOID FACTOR: Rheumatoid fact SerPl-aCnc: <10 [IU]/mL (ref <14)

## 2023-05-17 LAB — VITAMIN D 25 HYDROXY (VIT D DEFICIENCY, FRACTURES): Vit D, 25-Hydroxy: 29 ng/mL — ABNORMAL LOW (ref 30–100)

## 2023-05-17 LAB — CK: Total CK: 42 U/L (ref 29–143)

## 2023-05-17 LAB — CYCLIC CITRUL PEPTIDE ANTIBODY, IGG: Cyclic Citrullin Peptide Ab: <16 U

## 2023-05-17 LAB — TSH: TSH: 1.43 m[IU]/L

## 2023-05-17 NOTE — Progress Notes (Signed)
Vitamin D remains low.  All other labs are within normal limits.  Please forward results to her PCP.

## 2023-05-18 ENCOUNTER — Inpatient Hospital Stay: Payer: Medicare Other | Attending: Hematology and Oncology

## 2023-05-18 VITALS — BP 112/77 | HR 68 | Temp 98.2°F | Resp 20

## 2023-05-18 DIAGNOSIS — E538 Deficiency of other specified B group vitamins: Secondary | ICD-10-CM | POA: Diagnosis present

## 2023-05-18 MED ORDER — CYANOCOBALAMIN 1000 MCG/ML IJ SOLN
1000.0000 ug | Freq: Once | INTRAMUSCULAR | Status: AC
  Administered 2023-05-18: 1000 ug via INTRAMUSCULAR
  Filled 2023-05-18: qty 1

## 2023-05-18 NOTE — Patient Instructions (Signed)
Vitamin B12 Deficiency Vitamin B12 deficiency occurs when the body does not have enough of this important vitamin. The body needs this vitamin: To make red blood cells. To make DNA. This is the genetic material inside cells. To help the nerves work properly so they can carry messages from the brain to the body. Vitamin B12 deficiency can cause health problems, such as not having enough red blood cells in the blood (anemia). This can lead to nerve damage if untreated. What are the causes? This condition may be caused by: Not eating enough foods that contain vitamin B12. Not having enough stomach acid and digestive fluids to properly absorb vitamin B12 from the food that you eat. Having certain diseases that make it hard to absorb vitamin B12. These diseases include Crohn's disease, chronic pancreatitis, and cystic fibrosis. An autoimmune disorder in which the body does not make enough of a protein (intrinsic factor) within the stomach, resulting in not enough absorption of vitamin B12. Having a surgery in which part of the stomach or small intestine is removed. Taking certain medicines that make it hard for the body to absorb vitamin B12. These include: Heartburn medicines, such as antacids and proton pump inhibitors. Some medicines that are used to treat diabetes. What increases the risk? The following factors may make you more likely to develop a vitamin B12 deficiency: Being an older adult. Eating a vegetarian or vegan diet that does not include any foods that come from animals. Eating a poor diet while you are pregnant. Taking certain medicines. Having alcoholism. What are the signs or symptoms? In some cases, there are no symptoms of this condition. If the condition leads to anemia or nerve damage, various symptoms may occur, such as: Weakness. Tiredness (fatigue). Loss of appetite. Numbness or tingling in your hands and feet. Redness and burning of the tongue. Depression,  confusion, or memory problems. Trouble walking. If anemia is severe, symptoms can include: Shortness of breath. Dizziness. Rapid heart rate. How is this diagnosed? This condition may be diagnosed with a blood test to measure the level of vitamin B12 in your blood. You may also have other tests, including: A group of tests that measure certain characteristics of blood cells (complete blood count, CBC). A blood test to measure intrinsic factor. A procedure where a thin tube with a camera on the end is used to look into your stomach or intestines (endoscopy). Other tests may be needed to discover the cause of the deficiency. How is this treated? Treatment for this condition depends on the cause. This condition may be treated by: Changing your eating and drinking habits, such as: Eating more foods that contain vitamin B12. Drinking less alcohol or no alcohol. Getting vitamin B12 injections. Taking vitamin B12 supplements by mouth (orally). Your health care provider will tell you which dose is best for you. Follow these instructions at home: Eating and drinking  Include foods in your diet that come from animals and contain a lot of vitamin B12. These include: Meats and poultry. This includes beef, pork, chicken, turkey, and organ meats, such as liver. Seafood. This includes clams, rainbow trout, salmon, tuna, and haddock. Eggs. Dairy foods such as milk, yogurt, and cheese. Eat foods that have vitamin B12 added to them (are fortified), such as ready-to-eat breakfast cereals. Check the label on the package to see if a food is fortified. The items listed above may not be a complete list of foods and beverages you can eat and drink. Contact a dietitian for   more information. Alcohol use Do not drink alcohol if: Your health care provider tells you not to drink. You are pregnant, may be pregnant, or are planning to become pregnant. If you drink alcohol: Limit how much you have to: 0-1 drink a  day for women. 0-2 drinks a day for men. Know how much alcohol is in your drink. In the [place] one drink equals one 12 oz bottle of beer (355 mL), one 5 oz glass of wine (148 mL), or one 1 oz glass of hard liquor (44 mL). General instructions Get vitamin B12 injections if told to by your health care provider. Take supplements only as told by your health care provider. Follow the directions carefully. Keep all follow-up visits. This is important. Contact a health care provider if: Your symptoms come back. Your symptoms get worse or do not improve with treatment. Get help right away: You develop shortness of breath. You have a rapid heart rate. You have chest pain. You become dizzy or you faint. These symptoms may be an emergency. Get help right away. Call 911. Do not wait to see if the symptoms will go away. Do not drive yourself to the hospital. Summary Vitamin B12 deficiency occurs when the body does not have enough of this important vitamin. Common causes include not eating enough foods that contain vitamin B12, not being able to absorb vitamin B12 from the food that you eat, having a surgery in which part of the stomach or small intestine is removed, or taking certain medicines. Eat foods that have vitamin B12 in them. Treatment may include making a change in the way you eat and drink, getting vitamin B12 injections, or taking vitamin B12 supplements. This information is not intended to replace advice given to you by your health care provider. Make sure you discuss any questions you have with your health care provider. Document Revised: 02/21/2021 Document Reviewed: 02/21/2021 Elsevier Patient Education  2024 [employer]  

## 2023-05-28 ENCOUNTER — Encounter: Payer: Self-pay | Admitting: *Deleted

## 2023-05-28 ENCOUNTER — Telehealth: Payer: Self-pay | Admitting: *Deleted

## 2023-05-28 NOTE — Telephone Encounter (Signed)
Deceived call from pt requesting letter from MD stating she is being treated in our office for anemia.  Pt states letter is needed to go along with billing statements to re-apply for Medicaid 2025.  Letter completed and placed at front desk for pt to pick up.

## 2023-06-07 ENCOUNTER — Other Ambulatory Visit (HOSPITAL_COMMUNITY): Payer: Self-pay

## 2023-06-07 ENCOUNTER — Encounter: Payer: Self-pay | Admitting: Hematology and Oncology

## 2023-06-07 MED ORDER — SEMAGLUTIDE-WEIGHT MANAGEMENT 0.5 MG/0.5ML ~~LOC~~ SOAJ
0.5000 mg | SUBCUTANEOUS | 0 refills | Status: AC
Start: 2023-05-28 — End: ?
  Filled 2023-06-07: qty 2, 28d supply, fill #0

## 2023-06-09 NOTE — Progress Notes (Signed)
Office Visit Note  Patient: Meredith [name]             Date of Birth: 02/22/1981           MRN: [id number]             PCP: Soundra Pilon, FNP Referring: Soundra Pilon, FNP Visit Date: 06/22/2023 Occupation: @GUAROCC @  Subjective:  Increased fatigue and generalized pain  History of Present Illness: Meredith [name] is a 42 y.o. female returns today after her initial evaluation on May 14, 2023.  She continues to have generalized pain and fatigue.  She complains of discomfort in her hands, right knee, both feet, lower back.  She complains of decreased grip strength.  She has generalized achiness.  She states she has good days and bad days.  She states the pain moves around.  She has been experiencing extreme fatigue.  She states there are days when she cannot get out of bed.    Activities of Daily Living:  Patient reports morning stiffness for a few minutes.   Patient Reports nocturnal pain.  Difficulty dressing/grooming: Denies Difficulty climbing stairs: Reports Difficulty getting out of chair: Reports Difficulty using hands for taps, buttons, cutlery, and/or writing: Reports  Review of Systems  Constitutional:  Positive for fatigue.  HENT:  Negative for mouth dryness.   Eyes:  Negative for dryness.  Respiratory:  Negative for difficulty breathing.   Cardiovascular:  Negative for chest pain and palpitations.  Gastrointestinal:  Positive for constipation. Negative for blood in stool and diarrhea.  Endocrine: Negative for increased urination.  Genitourinary:  Negative for involuntary urination.  Musculoskeletal:  Positive for joint pain, gait problem, joint pain, myalgias, muscle weakness, morning stiffness, muscle tenderness and myalgias. Negative for joint swelling.  Skin:  Positive for hair loss. Negative for color change, rash and sensitivity to sunlight.  Allergic/Immunologic: Positive for susceptible to infections.  Neurological:  Positive for  headaches. Negative for dizziness and numbness.  Hematological:  Negative for swollen glands.  Psychiatric/Behavioral:  Negative for depressed mood and sleep disturbance. The patient is not nervous/anxious.     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    Chronic fatigue syndrome    DDD (degenerative disc disease), lumbar    Deficiency of other vitamins    Vitamin B6   Hypothyroidism    Hypothyroidism    Iron deficiency    Iron deficiency anemia    pernicious anemia   Lymphocytic gastritis    Malabsorption    Osteoarthritis    Pyelonephritis 06/21/2011   10 kidney stones surgically removed   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, none since   Vitamin B 12 deficiency    Vitamin D deficiency     Family History  Problem Relation Age of Onset   Allergic rhinitis Mother    Urolithiasis Mother    Sinusitis Mother    Eczema Daughter    Breast cancer Paternal Aunt    Hypertension Maternal Grandmother    Thyroid disease Maternal Grandmother    Dementia Maternal Grandmother  Heart disease Maternal Grandfather    Diabetes Maternal Grandfather    Eczema Son    Allergic rhinitis Son    Allergic rhinitis Son    Asthma Neg Hx    Immunodeficiency Neg Hx    Angioedema Neg Hx    Past Surgical History:  Procedure Laterality Date   CESAREAN SECTION  07/2007   CESAREAN SECTION WITH BILATERAL TUBAL LIGATION N/A 04/07/2014   Procedure: CESAREAN SECTION WITH BILATERAL TUBAL LIGATION;  Surgeon: Sharon Seller, DO;  Location: [facility];  Service: Obstetrics;  Laterality:  N/A;   COLONOSCOPY W/ ENDOSCOPIC [place]     cyst removal thumb Right    mirena iud N/A 2009   NEPHROLITHOTOMY  08/03/2011   Procedure: NEPHROLITHOTOMY PERCUTANEOUS;  Surgeon: Kathi Ludwig, MD;  Location: [facility];  Service: Urology;  Laterality: Left;   NEPHROSTOMY  08/03/2011   left   right wrist surgery  2005   I and D , infection to the bone   WISDOM TOOTH EXTRACTION  2006   Social History   Social History Narrative   Lives with family   Immunization History  Administered Date(s) Administered   Influenza, Seasonal, Injecte, Preservative Fre 08/24/2012   Influenza,inj,Quad PF,6+ Mos 04/08/2014, 05/02/2018   Influenza-Unspecified 04/26/2019   PPD Test 03/03/2012   Tdap 10/03/2010     Objective: Vital Signs: BP 110/74 (BP Location: Left Arm, Patient Position: Sitting, Cuff Size: Normal)   Pulse 91   Resp 16   Ht 5\' 2"  (1.575 m)   Wt 170 lb 9.6 oz (77.4 kg)   BMI 31.20 kg/m    Physical Exam Vitals and nursing note reviewed.  Constitutional:      Appearance: She is well-developed.  HENT:     Head: Normocephalic and atraumatic.  Eyes:     Conjunctiva/sclera: Conjunctivae normal.  Cardiovascular:     Rate and Rhythm: Normal rate and regular rhythm.     Heart sounds: Normal heart sounds.  Pulmonary:     Effort: Pulmonary effort is normal.     Breath sounds: Normal breath sounds.  Abdominal:     General: Bowel sounds are normal.     Palpations: Abdomen is soft.  Musculoskeletal:     Cervical back: Normal range of motion.  Lymphadenopathy:     Cervical: No cervical adenopathy.  Skin:    General: Skin is warm and dry.     Capillary Refill: Capillary refill takes less than 2 seconds.  Neurological:     Mental Status: She is alert and oriented to person, place, and time.  Psychiatric:        Behavior: Behavior normal.      Musculoskeletal Exam: Cervical spine was in good range of motion.  She had discomfort range of motion of the lumbar spine.  Shoulder  joints, elbow joints, wrist joints, MCPs PIPs and DIPs Juengel range of motion with no synovitis.  Hip joints and knee joints in good range of motion without any warmth swelling or effusion.  She had tenderness over bilateral trapezius and trochanteric region.  Generalized hyperalgesia and positive tender points were noted.  There was no tenderness over ankles or MTPs.  CDAI Exam: CDAI Score: -- Patient Global: --; Provider Global: -- Swollen: --; Tender: -- Joint Exam 06/22/2023   No joint exam has been documented for this visit   There is currently no information documented on the homunculus. Go to the Rheumatology activity and complete the homunculus joint exam.  Investigation: No additional findings.  Imaging: No results found.  Recent Labs: Lab Results  Component Value Date   WBC 8.5 04/13/2023   HGB 13.2 04/13/2023   PLT 328 04/13/2023   NA 141 04/13/2023   K 3.9 04/13/2023   CL 106 04/13/2023   CO2 29 04/13/2023   GLUCOSE 100 (H) 04/13/2023   BUN 12 04/13/2023   CREATININE 0.79 04/13/2023   BILITOT 0.3 04/13/2023   ALKPHOS 66 04/13/2023   AST 10 (L) 04/13/2023   ALT 10 04/13/2023   PROT 6.9 04/13/2023   ALBUMIN 4.2 04/13/2023   CALCIUM 9.6 04/13/2023   GFRAA >60 10/27/2019   May 14, 2023 CK 42, TSH normal, vitamin D 29, RF negative, anti-CCP negative, ANA negative, sed rate 9  Speciality Comments: No specialty comments available.  Procedures:  No procedures performed Allergies: Morphine and codeine, Promethazine, and Promethazine hcl   Assessment / Plan:     Visit Diagnoses: Polyarthralgia - History of pain and discomfort in multiple joints for last few years.  She continues to have generalized pain and discomfort.  No joint swelling or synovitis was noted on the examination today.  Pain in both hands -she complains of discomfort in the bilateral hands.  No synovitis was noted.  RF negative, anti-CCP negative, ANA negative, sed rate normal.  X-rays  obtained at the last visit were unremarkable.  X-ray findings were reviewed with the patient.  Joint protection muscle strengthening was discussed.  All autoimmune labs were negative.  Chronic pain of right knee -no warmth swelling or effusion was noted.  She is followed at [facility].  Patient was told that she had a cyst in her knee.  Pain in both feet -she complains of discomfort in the bilateral feet.  There was no synovitis on the examination.  X-rays obtained at the last visit were unremarkable except for left showed hallux valgus.  X-rays obtained at the last visit were reviewed.  Proper fitting shoes were advised.  Degeneration of intervertebral disc of lumbar region without discogenic back pain or lower extremity pain -she had painful range of motion of her lumbar spine.  She has chronic pain.  Patient had MRI at [facility].  Patient has back exercises.  I advised her to do stretching exercises on a daily basis.  Fibromyalgia -she gives history of fatigue, generalized pain.  She had hyperalgesia and positive tender points.  She had bilateral trapezius spasm and trochanteric bursitis.  CK normal.  Symptoms are suggestive of fibromyalgia syndrome.  Detailed counseling on a fibromyalgia syndrome was provided.  She may benefit from the use of Cymbalta.  She will discuss that further with her PCP.  I will refer her to physical therapy.  Benefits of water aerobics, swimming and daily exercise was discussed.  Benefits of stretching were also discussed.- Plan: Ambulatory referral to Physical Therapy  Chronic fatigue syndrome -she continues to have fatigue.  Diagnosed 2023 by her hematologist.  TSH normal.  Other medical problems are listed as follows:  Hypersomnolence  Vitamin D deficiency - Vitamin D low at 51.  Mixed hyperlipidemia  Fatty liver  B12 deficiency  Iron deficiency anemia, unspecified iron deficiency anemia type  Pernicious anemia  Adenomatous polyp of colon, unspecified  part of colon  Nephrolithiasis  Acquired hypothyroidism  Morbid (severe) obesity due to excess calories (HCC)  Orders: Orders Placed This Encounter  Procedures   Ambulatory referral to Physical Therapy   No orders of the defined types were placed in this encounter.     Follow-Up Instructions:  Return if symptoms worsen or fail to improve, for Osteoarthritis.   Pollyann Savoy, MD  Note - This record has been created using Animal nutritionist.  Chart creation errors have been sought, but may not always  have been located. Such creation errors do not reflect on  the standard of medical care.

## 2023-06-15 ENCOUNTER — Inpatient Hospital Stay: Payer: Medicare Other

## 2023-06-16 ENCOUNTER — Inpatient Hospital Stay: Payer: Medicare Other | Attending: Hematology and Oncology

## 2023-06-16 VITALS — BP 103/70 | HR 71 | Temp 98.4°F | Resp 18

## 2023-06-16 DIAGNOSIS — E538 Deficiency of other specified B group vitamins: Secondary | ICD-10-CM | POA: Diagnosis not present

## 2023-06-16 MED ORDER — CYANOCOBALAMIN 1000 MCG/ML IJ SOLN
1000.0000 ug | Freq: Once | INTRAMUSCULAR | Status: AC
  Administered 2023-06-16: 1000 ug via INTRAMUSCULAR
  Filled 2023-06-16: qty 1

## 2023-06-22 ENCOUNTER — Ambulatory Visit: Payer: Medicare Other | Attending: Rheumatology | Admitting: Rheumatology

## 2023-06-22 ENCOUNTER — Encounter: Payer: Self-pay | Admitting: Rheumatology

## 2023-06-22 VITALS — BP 110/74 | HR 91 | Resp 16 | Ht 62.0 in | Wt 170.6 lb

## 2023-06-22 DIAGNOSIS — M79641 Pain in right hand: Secondary | ICD-10-CM | POA: Diagnosis not present

## 2023-06-22 DIAGNOSIS — M51369 Other intervertebral disc degeneration, lumbar region without mention of lumbar back pain or lower extremity pain: Secondary | ICD-10-CM | POA: Insufficient documentation

## 2023-06-22 DIAGNOSIS — M79671 Pain in right foot: Secondary | ICD-10-CM | POA: Diagnosis not present

## 2023-06-22 DIAGNOSIS — G4710 Hypersomnia, unspecified: Secondary | ICD-10-CM | POA: Diagnosis not present

## 2023-06-22 DIAGNOSIS — D509 Iron deficiency anemia, unspecified: Secondary | ICD-10-CM | POA: Diagnosis not present

## 2023-06-22 DIAGNOSIS — E538 Deficiency of other specified B group vitamins: Secondary | ICD-10-CM | POA: Diagnosis not present

## 2023-06-22 DIAGNOSIS — G9332 Myalgic encephalomyelitis/chronic fatigue syndrome: Secondary | ICD-10-CM | POA: Diagnosis not present

## 2023-06-22 DIAGNOSIS — G8929 Other chronic pain: Secondary | ICD-10-CM | POA: Insufficient documentation

## 2023-06-22 DIAGNOSIS — E782 Mixed hyperlipidemia: Secondary | ICD-10-CM | POA: Insufficient documentation

## 2023-06-22 DIAGNOSIS — M25561 Pain in right knee: Secondary | ICD-10-CM | POA: Insufficient documentation

## 2023-06-22 DIAGNOSIS — M2550 Pain in unspecified joint: Secondary | ICD-10-CM | POA: Insufficient documentation

## 2023-06-22 DIAGNOSIS — E039 Hypothyroidism, unspecified: Secondary | ICD-10-CM | POA: Diagnosis not present

## 2023-06-22 DIAGNOSIS — M79672 Pain in left foot: Secondary | ICD-10-CM | POA: Insufficient documentation

## 2023-06-22 DIAGNOSIS — D126 Benign neoplasm of colon, unspecified: Secondary | ICD-10-CM | POA: Diagnosis not present

## 2023-06-22 DIAGNOSIS — M791 Myalgia, unspecified site: Secondary | ICD-10-CM

## 2023-06-22 DIAGNOSIS — N200 Calculus of kidney: Secondary | ICD-10-CM | POA: Diagnosis not present

## 2023-06-22 DIAGNOSIS — E559 Vitamin D deficiency, unspecified: Secondary | ICD-10-CM | POA: Insufficient documentation

## 2023-06-22 DIAGNOSIS — M797 Fibromyalgia: Secondary | ICD-10-CM | POA: Diagnosis not present

## 2023-06-22 DIAGNOSIS — D510 Vitamin B12 deficiency anemia due to intrinsic factor deficiency: Secondary | ICD-10-CM | POA: Diagnosis not present

## 2023-06-22 DIAGNOSIS — K760 Fatty (change of) liver, not elsewhere classified: Secondary | ICD-10-CM | POA: Diagnosis not present

## 2023-06-22 DIAGNOSIS — M79642 Pain in left hand: Secondary | ICD-10-CM | POA: Diagnosis not present

## 2023-06-22 NOTE — Patient Instructions (Signed)
Myofascial Pain Syndrome and Fibromyalgia Myofascial pain syndrome and fibromyalgia are both pain disorders. You may feel this pain mainly in your muscles. Myofascial pain syndrome: Always has tender points in the muscles that will cause pain when pressed (trigger points). The pain may come and go. Usually affects your neck, upper back, and shoulder areas. The pain often moves into your arms and hands. Fibromyalgia: Has muscle pains and tenderness that come and go. Is often associated with tiredness (fatigue) and sleep problems. Has trigger points. Tends to be long-lasting (chronic), but is not life-threatening. Fibromyalgia and myofascial pain syndrome are not the same. However, they often occur together. If you have both conditions, each can make the other worse. Both are common and can cause enough pain and fatigue to make day-to-day activities difficult. Both can be hard to diagnose because their symptoms are common in many other conditions. What are the causes? The exact causes of these conditions are not known. What increases the risk? You are more likely to develop either of these conditions if: You have a family history of the condition. You are female. You have certain triggers, such as: Spine disorders. An injury (trauma) or other physical stressors. Being under a lot of stress. Medical conditions such as osteoarthritis, rheumatoid arthritis, or lupus. What are the signs or symptoms? Fibromyalgia The main symptom of fibromyalgia is widespread pain and tenderness in your muscles. Pain is sometimes described as stabbing, shooting, or burning. You may also have: Tingling or numbness. Sleep problems and fatigue. Problems with attention and concentration (fibro fog). Other symptoms may include: Bowel and bladder problems. Headaches. Vision problems. Sensitivity to odors and noises. Depression or mood changes. Painful menstrual periods (dysmenorrhea). Dry skin or eyes. These  symptoms can vary over time. Myofascial pain syndrome Symptoms of myofascial pain syndrome include: Tight, ropy bands of muscle. Uncomfortable sensations in muscle areas. These may include aching, cramping, burning, numbness, tingling, and weakness. Difficulty moving certain parts of the body freely (poor range of motion). How is this diagnosed? This condition may be diagnosed by your symptoms and medical history. You will also have a physical exam. In general: Fibromyalgia is diagnosed if you have pain, fatigue, and other symptoms for more than 3 months, and symptoms cannot be explained by another condition. Myofascial pain syndrome is diagnosed if you have trigger points in your muscles, and those trigger points are tender and cause pain elsewhere in your body (referred pain). How is this treated? Treatment for these conditions depends on the type that you have. For fibromyalgia, a healthy lifestyle is the most important treatment including aerobic and strength exercises. Different types of medicines are used to help treat pain and include: NSAIDs. Medicines for treating depression. Medicines that help control seizures. Medicines that relax the muscles. Treatment for myofascial pain syndrome includes: Pain medicines, such as NSAIDs. Cooling and stretching of muscles. Massage therapy with myofascial release technique. Trigger point injections. Treating these conditions often requires a team of health care providers. These may include: Your primary care provider. A physical therapist. Complementary health care providers, such as massage therapists or acupuncturists. A psychiatrist for cognitive behavioral therapy. Follow these instructions at home: Medicines Take over-the-counter and prescription medicines only as told by your health care provider. Ask your health care provider if the medicine prescribed to you: Requires you to avoid driving or using machinery. Can cause constipation.  You may need to take these actions to prevent or treat constipation: Drink enough fluid to keep your urine pale  yellow. Take over-the-counter or prescription medicines. Eat foods that are high in fiber, such as beans, whole grains, and fresh fruits and vegetables. Limit foods that are high in fat and processed sugars, such as fried or sweet foods. Lifestyle  Do exercises as told by your health care provider or physical therapist. Practice relaxation techniques to control your stress. You may want to try: Biofeedback. Visual imagery. Hypnosis. Muscle relaxation. Yoga. Meditation. Maintain a healthy lifestyle. This includes eating a healthy diet and getting enough sleep. Do not use any products that contain nicotine or tobacco. These products include cigarettes, chewing tobacco, and vaping devices, such as e-cigarettes. If you need help quitting, ask your health care provider. General instructions Talk to your health care provider about complementary treatments, such as acupuncture or massage. Do not do activities that stress or strain your muscles. This includes repetitive motions and heavy lifting. Keep all follow-up visits. This is important. Where to find support Consider joining a support group with others who are diagnosed with this condition. National Fibromyalgia Association: fmaware.org Where to find more information [place]. Pain Foundation: uspainfoundation.org Contact a health care provider if: You have new symptoms. Your symptoms get worse or your pain is severe. You have side effects from your medicines. You have trouble sleeping. Your condition is causing depression or anxiety. Get help right away if: You have thoughts of hurting yourself or others. Get help right away if you feel like you may hurt yourself or others, or have thoughts about taking your own life. Go to your nearest emergency room or: Call 911. Call the National Suicide Prevention Lifeline at 478-451-0390  or 988. This is open 24 hours a day. Text the Crisis Text Line at 941-230-5677. This information is not intended to replace advice given to you by your health care provider. Make sure you discuss any questions you have with your health care provider. Document Revised: 04/06/2022 Document Reviewed: 05/30/2021 Elsevier Patient Education  2024 [employer] Hand Exercises Hand exercises can be helpful for almost anyone. They can strengthen your hands and improve flexibility and movement. The exercises can also increase blood flow to the hands. These results can make your work and daily tasks easier for you. Hand exercises can be especially helpful for people who have joint pain from arthritis or nerve damage from using their hands over and over. These exercises can also help people who injure a hand. Exercises Most of these hand exercises are gentle stretching and motion exercises. It is usually safe to do them often throughout the day. Warming up your hands before exercise may help reduce stiffness. You can do this with gentle massage or by placing your hands in warm water for 10-15 minutes. It is normal to feel some stretching, pulling, tightness, or mild discomfort when you begin new exercises. In time, this will improve. Remember to always be careful and stop right away if you feel sudden, very bad pain or your pain gets worse. You want to get better and be safe. Ask your health care provider which exercises are safe for you. Do exercises exactly as told by your provider and adjust them as told. Do not begin these exercises until told by your provider. Knuckle bend or "claw" fist  Stand or sit with your arm, hand, and all five fingers pointed straight up. Make sure to keep your wrist straight. Gently bend your fingers down toward your palm until the tips of your fingers are touching your palm. Keep your big knuckle straight and  only bend the small knuckles in your fingers. Hold this position for 10  seconds. Straighten your fingers back to your starting position. Repeat this exercise 5-10 times with each hand. Full finger fist  Stand or sit with your arm, hand, and all five fingers pointed straight up. Make sure to keep your wrist straight. Gently bend your fingers into your palm until the tips of your fingers are touching the middle of your palm. Hold this position for 10 seconds. Extend your fingers back to your starting position, stretching every joint fully. Repeat this exercise 5-10 times with each hand. Straight fist  Stand or sit with your arm, hand, and all five fingers pointed straight up. Make sure to keep your wrist straight. Gently bend your fingers at the big knuckle, where your fingers meet your hand, and at the middle knuckle. Keep the knuckle at the tips of your fingers straight and try to touch the bottom of your palm. Hold this position for 10 seconds. Extend your fingers back to your starting position, stretching every joint fully. Repeat this exercise 5-10 times with each hand. Tabletop  Stand or sit with your arm, hand, and all five fingers pointed straight up. Make sure to keep your wrist straight. Gently bend your fingers at the big knuckle, where your fingers meet your hand, as far down as you can. Keep the small knuckles in your fingers straight. Think of forming a tabletop with your fingers. Hold this position for 10 seconds. Extend your fingers back to your starting position, stretching every joint fully. Repeat this exercise 5-10 times with each hand. Finger spread  Place your hand flat on a table with your palm facing down. Make sure your wrist stays straight. Spread your fingers and thumb apart from each other as far as you can until you feel a gentle stretch. Hold this position for 10 seconds. Bring your fingers and thumb tight together again. Hold this position for 10 seconds. Repeat this exercise 5-10 times with each hand. Making circles  Stand or  sit with your arm, hand, and all five fingers pointed straight up. Make sure to keep your wrist straight. Make a circle by touching the tip of your thumb to the tip of your index finger. Hold for 10 seconds. Then open your hand wide. Repeat this motion with your thumb and each of your fingers. Repeat this exercise 5-10 times with each hand. Thumb motion  Sit with your forearm resting on a table and your wrist straight. Your thumb should be facing up toward the ceiling. Keep your fingers relaxed as you move your thumb. Lift your thumb up as high as you can toward the ceiling. Hold for 10 seconds. Bend your thumb across your palm as far as you can, reaching the tip of your thumb for the small finger (pinkie) side of your palm. Hold for 10 seconds. Repeat this exercise 5-10 times with each hand. Grip strengthening  Hold a stress ball or other soft ball in the middle of your hand. Slowly increase the pressure, squeezing the ball as much as you can without causing pain. Think of bringing the tips of your fingers into the middle of your palm. All of your finger joints should bend when doing this exercise. Hold your squeeze for 10 seconds, then relax. Repeat this exercise 5-10 times with each hand. Contact a health care provider if: Your hand pain or discomfort gets much worse when you do an exercise. Your hand pain or discomfort does not  improve within 2 hours after you exercise. If you have either of these problems, stop doing these exercises right away. Do not do them again unless your provider says that you can. Get help right away if: You develop sudden, severe hand pain or swelling. If this happens, stop doing these exercises right away. Do not do them again unless your provider says that you can. This information is not intended to replace advice given to you by your health care provider. Make sure you discuss any questions you have with your health care provider. Document Revised: 07/14/2022  Document Reviewed: 07/14/2022 Elsevier Patient Education  2024 [employer].

## 2023-06-28 ENCOUNTER — Other Ambulatory Visit (HOSPITAL_COMMUNITY): Payer: Self-pay

## 2023-07-09 ENCOUNTER — Encounter: Payer: Self-pay | Admitting: Hematology and Oncology

## 2023-07-15 ENCOUNTER — Encounter: Payer: Self-pay | Admitting: Hematology and Oncology

## 2023-07-19 ENCOUNTER — Inpatient Hospital Stay: Payer: Medicare Other | Attending: Hematology and Oncology

## 2023-07-19 ENCOUNTER — Other Ambulatory Visit: Payer: Self-pay | Admitting: Hematology and Oncology

## 2023-07-19 DIAGNOSIS — D510 Vitamin B12 deficiency anemia due to intrinsic factor deficiency: Secondary | ICD-10-CM

## 2023-07-19 DIAGNOSIS — E538 Deficiency of other specified B group vitamins: Secondary | ICD-10-CM | POA: Insufficient documentation

## 2023-07-19 DIAGNOSIS — E559 Vitamin D deficiency, unspecified: Secondary | ICD-10-CM | POA: Insufficient documentation

## 2023-07-19 DIAGNOSIS — D509 Iron deficiency anemia, unspecified: Secondary | ICD-10-CM

## 2023-07-19 LAB — IRON AND IRON BINDING CAPACITY (CC-WL,HP ONLY)
Iron: 43 ug/dL (ref 28–170)
Saturation Ratios: 11 % (ref 10.4–31.8)
TIBC: 382 ug/dL (ref 250–450)
UIBC: 339 ug/dL (ref 148–442)

## 2023-07-19 LAB — CMP (CANCER CENTER ONLY)
ALT: 12 U/L (ref 0–44)
AST: 12 U/L — ABNORMAL LOW (ref 15–41)
Albumin: 4.4 g/dL (ref 3.5–5.0)
Alkaline Phosphatase: 61 U/L (ref 38–126)
Anion gap: 7 (ref 5–15)
BUN: 13 mg/dL (ref 6–20)
CO2: 24 mmol/L (ref 22–32)
Calcium: 9.5 mg/dL (ref 8.9–10.3)
Chloride: 107 mmol/L (ref 98–111)
Creatinine: 0.81 mg/dL (ref 0.44–1.00)
GFR, Estimated: >60 mL/min (ref >60)
Glucose, Bld: 105 mg/dL — ABNORMAL HIGH (ref 70–99)
Potassium: 3.9 mmol/L (ref 3.5–5.1)
Sodium: 138 mmol/L (ref 135–145)
Total Bilirubin: 0.4 mg/dL (ref 0.0–1.2)
Total Protein: 7.2 g/dL (ref 6.5–8.1)

## 2023-07-19 LAB — CBC WITH DIFFERENTIAL (CANCER CENTER ONLY)
Abs Immature Granulocytes: 0.02 10*3/uL (ref 0.00–0.07)
Basophils Absolute: 0.1 10*3/uL (ref 0.0–0.1)
Basophils Relative: 1 %
Eosinophils Absolute: 0.2 10*3/uL (ref 0.0–0.5)
Eosinophils Relative: 2 %
HCT: 40.8 % (ref 36.0–46.0)
Hemoglobin: 13.9 g/dL (ref 12.0–15.0)
Immature Granulocytes: 0 %
Lymphocytes Relative: 26 %
Lymphs Abs: 2.2 10*3/uL (ref 0.7–4.0)
MCH: 29 pg (ref 26.0–34.0)
MCHC: 34.1 g/dL (ref 30.0–36.0)
MCV: 85 fL (ref 80.0–100.0)
Monocytes Absolute: 0.7 10*3/uL (ref 0.1–1.0)
Monocytes Relative: 9 %
Neutro Abs: 5.2 10*3/uL (ref 1.7–7.7)
Neutrophils Relative %: 62 %
Platelet Count: 302 10*3/uL (ref 150–400)
RBC: 4.8 MIL/uL (ref 3.87–5.11)
RDW: 12.5 % (ref 11.5–15.5)
WBC Count: 8.4 10*3/uL (ref 4.0–10.5)
nRBC: 0 % (ref 0.0–0.2)

## 2023-07-19 LAB — VITAMIN B12: Vitamin B-12: 625 pg/mL (ref 180–914)

## 2023-07-19 LAB — VITAMIN D 25 HYDROXY (VIT D DEFICIENCY, FRACTURES): Vit D, 25-Hydroxy: 28.05 ng/mL — ABNORMAL LOW (ref 30–100)

## 2023-07-19 LAB — FERRITIN: Ferritin: 8 ng/mL — ABNORMAL LOW (ref 11–307)

## 2023-07-21 ENCOUNTER — Inpatient Hospital Stay: Payer: Medicare Other | Admitting: Hematology and Oncology

## 2023-07-21 ENCOUNTER — Inpatient Hospital Stay: Payer: Medicare Other

## 2023-07-21 VITALS — BP 114/67 | HR 87 | Temp 97.6°F | Resp 18 | Ht 62.0 in | Wt 164.8 lb

## 2023-07-21 DIAGNOSIS — E538 Deficiency of other specified B group vitamins: Secondary | ICD-10-CM | POA: Diagnosis not present

## 2023-07-21 DIAGNOSIS — D509 Iron deficiency anemia, unspecified: Secondary | ICD-10-CM | POA: Diagnosis not present

## 2023-07-21 DIAGNOSIS — E559 Vitamin D deficiency, unspecified: Secondary | ICD-10-CM | POA: Diagnosis not present

## 2023-07-21 MED ORDER — CYANOCOBALAMIN 1000 MCG/ML IJ SOLN
1000.0000 ug | Freq: Once | INTRAMUSCULAR | Status: AC
  Administered 2023-07-21: 1000 ug via INTRAMUSCULAR
  Filled 2023-07-21: qty 1

## 2023-07-21 NOTE — Progress Notes (Signed)
 Patient Care Team: Marvene Prentice SAUNDERS, FNP as PCP - General (Family Medicine)  DIAGNOSIS:  Encounter Diagnosis  Name Primary?   Iron  deficiency anemia, unspecified iron  deficiency anemia type Yes    CHIEF COMPLIANT: Follow-up of chronic fatigue syndrome and iron  deficiency anemia  HISTORY OF PRESENT ILLNESS:   History of Present Illness   The patient, with a history of chronic fatigue, low ferritin levels, and low vitamin D  levels, presents with ongoing fatigue. She reports that despite taking iron  supplements, her ferritin levels continue to fluctuate. She also mentions that despite taking vitamin D  supplements twice a week, her levels remain on the lower side.  The patient also reports having osteoarthritis in her hands and feet, and degenerative disc disease in her lower back. She has been diagnosed with fibromyalgia, which aligns with her chronic fatigue syndrome.  The patient also mentions feeling lightheaded upon standing up. She reports that this does not happen every day or every time she stands up, but when it does, she has to sit back down for a few minutes until the lightheadedness subsides.  The patient also expresses concern about slightly elevated glucose levels, as diabetes runs in her family. She mentions that she always fasts before coming to the doctor's office.  The patient also mentions that her feet are very dry and crack, despite moisturizing them regularly. She wonders if this could be due to vitamin deficiencies.         ALLERGIES:  is allergic to morphine  and codeine, promethazine , and promethazine  hcl.  MEDICATIONS:  Current Outpatient Medications  Medication Sig Dispense Refill   cyanocobalamin  (,VITAMIN B-12,) 1000 MCG/ML injection Inject into the muscle every 30 (thirty) days. Take depending on my levels     ferrous sulfate 220 (44 Fe) MG/5ML solution Take by mouth. Infusion administered 3 every 3 months depending on levels.     ibuprofen  (ADVIL ) 200 MG  tablet Take 200 mg by mouth every 6 (six) hours as needed for moderate pain.     levothyroxine (SYNTHROID) 50 MCG tablet Take 50 mcg by mouth daily before breakfast.     ondansetron  (ZOFRAN -ODT) 4 MG disintegrating tablet Take 1 tablet (4 mg total) by mouth every 8 (eight) hours as needed for nausea or vomiting. 12 tablet 0   Semaglutide -Weight Management 0.5 MG/0.5ML SOAJ Inject 0.5 mg into the skin once a week. 2 mL 0   Vitamin D , Ergocalciferol , (DRISDOL ) 1.25 MG (50000 UNIT) CAPS capsule TAKE 1 CAPSULE (50,000 UNITS TOTAL) BY MOUTH TWO TIMES A WEEK 24 capsule 3   No current facility-administered medications for this visit.    PHYSICAL EXAMINATION: ECOG PERFORMANCE STATUS: 1 - Symptomatic but completely ambulatory  Vitals:   07/21/23 0907  BP: 114/67  Pulse: 87  Resp: 18  Temp: 97.6 F (36.4 C)  SpO2: 99%   Filed Weights   07/21/23 0907  Weight: 164 lb 12.8 oz (74.8 kg)    Physical Exam          (exam performed in the presence of a chaperone)  LABORATORY DATA:  I have reviewed the data as listed    Latest Ref Rng & Units 07/19/2023   10:07 AM 04/13/2023    9:51 AM 02/09/2023    9:29 PM  CMP  Glucose 70 - 99 mg/dL 894  899  88   BUN 6 - 20 mg/dL 13  12  17    Creatinine 0.44 - 1.00 mg/dL 9.18  9.20  9.30   Sodium 135 -  145 mmol/L 138  141  138   Potassium 3.5 - 5.1 mmol/L 3.9  3.9  4.0   Chloride 98 - 111 mmol/L 107  106  103   CO2 22 - 32 mmol/L 24  29  26    Calcium 8.9 - 10.3 mg/dL 9.5  9.6  9.6   Total Protein 6.5 - 8.1 g/dL 7.2  6.9    Total Bilirubin 0.0 - 1.2 mg/dL 0.4  0.3    Alkaline Phos 38 - 126 U/L 61  66    AST 15 - 41 U/L 12  10    ALT 0 - 44 U/L 12  10      Lab Results  Component Value Date   WBC 8.4 07/19/2023   HGB 13.9 07/19/2023   HCT 40.8 07/19/2023   MCV 85.0 07/19/2023   PLT 302 07/19/2023   NEUTROABS 5.2 07/19/2023    ASSESSMENT & PLAN:  Iron  deficiency anemia Bone marrow biopsy 07/03/2020: 50 to 60% cellularity, normocellular bone  marrow with trilineage hematopoiesis.  No dyspoietic or dysplastic changes noted Cytogenetics Neg Recommendation: Sublingual B12 5000 mcg twice a week Severe vitamin D  deficiency:  50,000 units of vitamin D  twice a week.  Vitamin D  levels continue to be low   Severe and profound fatigue: She applied for disability and it was denied. Lab review:  06/25/2021: WBC 12.2, MCV 85.9, ferritin 4, iron  saturation 9%, B12 506, folate > 5.9  09/23/2021: Hemoglobin 13.3, MCV 87, B12 466,  iron  studies are normal with a ferritin of 36 12/19/21: Iron  Sat: 7%, Vit D 25.93, Hb 13.6, Ferritin 6  03/20/2022: Hemoglobin 12.8, MCV 87.4, B12 383, iron  saturation 6%, ferritin 52 07/07/2022: Hemoglobin 13.2, MCV 86.6, vitamin D23.51, B12 422, iron  saturation 8%, ferritin 6 10/26/2022: Hemoglobin 13.9, MCV 87.4, vitamin D29.4, B12 470, iron  saturation 16%, ferritin 37 01/12/2023: Hemoglobin 12.5 MCV 89.5, iron  saturation 14%, ferritin 770 and 132, zinc  82, copper  95 (normal), B12 564, vitamin D  29.49 04/13/2023: Hemoglobin 13.2, iron  saturation 16%, ferritin 50   IV Iron : 06/2021, June 2023, January 2024, July 2024   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 Could be autoimmune in nature. She cannot work or function on a day to day basis because of this condition.   Continue with monthly B12 injections No role of additional IV iron  at this time.   Back Pain     General Health Maintenance -Continue B12 shots as scheduled. I will see her back in 3 months with labs and follow-up ------------------------------------- Assessment and Plan    Iron  Deficiency Chronic low ferritin levels despite iron  supplementation. No evidence of anemia or bleeding. -Administer three more rounds of iron  supplementation.  Vitamin D  Deficiency Persistent despite supplementation. -Continue current regimen of Vitamin D  supplementation twice weekly.  Osteoarthritis (OA) Diagnosed in hands and feet by  Rheumatologist. -No change in management discussed.  Degenerative Disc Disease Diagnosed in lower back by Orthopedic specialist. -No change in management discussed.  Fibromyalgia Newly diagnosed, associated with chronic fatigue syndrome. -No change in management discussed.  Lightheadedness Occasional upon standing, possibly related to low blood pressure. -Advise patient to check blood pressure at home during symptomatic episodes.  Dry, Cracked Skin on Feet Possibly related to nutritional deficiencies. -No change in management discussed.  Hyperglycemia Mildly elevated glucose levels on recent labs, patient has family history of diabetes. -Recommend annual Hemoglobin A1c testing with primary care provider.          Orders Placed  This Encounter  Procedures   CBC with Differential ([facility] Only)    Standing Status:   Future    Expiration Date:   07/20/2024   Ferritin    Standing Status:   Future    Expiration Date:   07/20/2024   Iron  and Iron  Binding Capacity ([facility] only)    Standing Status:   Future    Expiration Date:   07/20/2024   Vitamin B12    Standing Status:   Future    Expiration Date:   07/20/2024   The patient has a good understanding of the overall plan. she agrees with it. she will call with any problems that may develop before the next visit here. Total time spent: 30 mins including face to face time and time spent for planning, charting and co-ordination of care   Naomi MARLA Chad, MD 07/21/23

## 2023-07-21 NOTE — Assessment & Plan Note (Signed)
 Bone marrow biopsy 07/03/2020: 50 to 60% cellularity, normocellular bone marrow with trilineage hematopoiesis.  No dyspoietic or dysplastic changes noted Cytogenetics Neg Recommendation: Sublingual B12 5000 mcg twice a week Severe vitamin D  deficiency:  50,000 units of vitamin D  twice a week.  Vitamin D  levels continue to be low   Severe and profound fatigue: She applied for disability and it was denied. Lab review:  06/25/2021: WBC 12.2, MCV 85.9, ferritin 4, iron  saturation 9%, B12 506, folate > 5.9  09/23/2021: Hemoglobin 13.3, MCV 87, B12 466,  iron  studies are normal with a ferritin of 36 12/19/21: Iron  Sat: 7%, Vit D 25.93, Hb 13.6, Ferritin 6  03/20/2022: Hemoglobin 12.8, MCV 87.4, B12 383, iron  saturation 6%, ferritin 52 07/07/2022: Hemoglobin 13.2, MCV 86.6, vitamin D23.51, B12 422, iron  saturation 8%, ferritin 6 10/26/2022: Hemoglobin 13.9, MCV 87.4, vitamin D29.4, B12 470, iron  saturation 16%, ferritin 37 01/12/2023: Hemoglobin 12.5 MCV 89.5, iron  saturation 14%, ferritin 770 and 132, zinc  82, copper  95 (normal), B12 564, vitamin D  29.49 04/13/2023: Hemoglobin 13.2, iron  saturation 16%, ferritin 50   IV Iron : 06/2021, June 2023, January 2024, July 2024   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 Could be autoimmune in nature. She cannot work or function on a day to day basis because of this condition.   Continue with monthly B12 injections No role of additional IV iron  at this time.   Back Pain     General Health Maintenance -Continue B12 shots as scheduled. I will see her back in 3 months with labs and follow-up

## 2023-07-28 ENCOUNTER — Ambulatory Visit: Payer: Medicare Other

## 2023-07-28 ENCOUNTER — Inpatient Hospital Stay: Payer: Medicare Other

## 2023-07-28 VITALS — BP 111/78 | HR 82 | Resp 17

## 2023-07-28 DIAGNOSIS — E538 Deficiency of other specified B group vitamins: Secondary | ICD-10-CM

## 2023-07-28 DIAGNOSIS — D509 Iron deficiency anemia, unspecified: Secondary | ICD-10-CM | POA: Diagnosis not present

## 2023-07-28 DIAGNOSIS — E559 Vitamin D deficiency, unspecified: Secondary | ICD-10-CM | POA: Diagnosis not present

## 2023-07-28 MED ORDER — SODIUM CHLORIDE 0.9 % IV SOLN: Freq: Once | INTRAVENOUS | Status: AC

## 2023-07-28 MED ORDER — ACETAMINOPHEN 325 MG PO TABS
650.0000 mg | ORAL_TABLET | Freq: Once | ORAL | Status: AC
  Administered 2023-07-28: 650 mg via ORAL
  Filled 2023-07-28: qty 2

## 2023-07-28 MED ORDER — DIPHENHYDRAMINE HCL 25 MG PO CAPS
25.0000 mg | ORAL_CAPSULE | Freq: Once | ORAL | Status: AC
Start: 2023-07-28 — End: 2023-07-28
  Administered 2023-07-28: 25 mg via ORAL
  Filled 2023-07-28: qty 1

## 2023-07-28 MED ORDER — SODIUM CHLORIDE 0.9 % IV SOLN
300.0000 mg | Freq: Once | INTRAVENOUS | Status: AC
  Administered 2023-07-28: 300 mg via INTRAVENOUS
  Filled 2023-07-28: qty 300

## 2023-07-28 NOTE — Patient Instructions (Signed)
 Iron Sucrose Injection What is this medication? IRON SUCROSE (EYE ern SOO krose) treats low levels of iron (iron deficiency anemia) in people with kidney disease. Iron is a mineral that plays an important role in making red blood cells, which carry oxygen from your lungs to the rest of your body. This medicine may be used for other purposes; ask your health care provider or pharmacist if you have questions. COMMON BRAND NAME(S): Venofer What should I tell my care team before I take this medication? They need to know if you have any of these conditions: Anemia not caused by low iron levels Heart disease High levels of iron in the blood Kidney disease Liver disease An unusual or allergic reaction to iron, other medications, foods, dyes, or preservatives Pregnant or trying to get pregnant Breastfeeding How should I use this medication? This medication is for infusion into a vein. It is given in a hospital or clinic setting. Talk to your care team about the use of this medication in children. While this medication may be prescribed for children as young as 2 years for selected conditions, precautions do apply. Overdosage: If you think you have taken too much of this medicine contact a poison control center or emergency room at once. NOTE: This medicine is only for you. Do not share this medicine with others. What if I miss a dose? Keep appointments for follow-up doses. It is important not to miss your dose. Call your care team if you are unable to keep an appointment. What may interact with this medication? Do not take this medication with any of the following: Deferoxamine Dimercaprol Other iron products This medication may also interact with the following: Chloramphenicol Deferasirox This list may not describe all possible interactions. Give your health care provider a list of all the medicines, herbs, non-prescription drugs, or dietary supplements you use. Also tell them if you smoke,  drink alcohol, or use illegal drugs. Some items may interact with your medicine. What should I watch for while using this medication? Visit your care team regularly. Tell your care team if your symptoms do not start to get better or if they get worse. You may need blood work done while you are taking this medication. You may need to follow a special diet. Talk to your care team. Foods that contain iron include: whole grains/cereals, dried fruits, beans, or peas, leafy green vegetables, and organ meats (liver, kidney). What side effects may I notice from receiving this medication? Side effects that you should report to your care team as soon as possible: Allergic reactions--skin rash, itching, hives, swelling of the face, lips, tongue, or throat Low blood pressure--dizziness, feeling faint or lightheaded, blurry vision Shortness of breath Side effects that usually do not require medical attention (report to your care team if they continue or are bothersome): Flushing Headache Joint pain Muscle pain Nausea Pain, redness, or irritation at injection site This list may not describe all possible side effects. Call your doctor for medical advice about side effects. You may report side effects to FDA at 1-800-FDA-1088. Where should I keep my medication? This medication is given in a hospital or clinic. It will not be stored at home. NOTE: This sheet is a summary. It may not cover all possible information. If you have questions about this medicine, talk to your doctor, pharmacist, or health care provider.  2024 Elsevier/Gold Standard (2022-12-04 00:00:00)

## 2023-07-28 NOTE — Progress Notes (Signed)
Patient declined to stay for 30 minute post iron observation.

## 2023-08-04 ENCOUNTER — Inpatient Hospital Stay: Payer: Medicare Other

## 2023-08-04 ENCOUNTER — Ambulatory Visit: Payer: Medicare Other

## 2023-08-04 ENCOUNTER — Telehealth: Payer: Self-pay | Admitting: Hematology and Oncology

## 2023-08-04 NOTE — Telephone Encounter (Signed)
Called patient no answer, patient called earlier and spoke with scheduler Aram Beecham and stated unable to make appt for today and the other appts didn't work well with her schedule. I called back to reschedule appt no answer. Left message to call back.

## 2023-08-11 ENCOUNTER — Ambulatory Visit: Payer: Medicare Other

## 2023-08-11 ENCOUNTER — Inpatient Hospital Stay: Payer: Medicare Other

## 2023-08-11 VITALS — BP 109/68 | HR 88 | Temp 97.7°F

## 2023-08-11 DIAGNOSIS — E538 Deficiency of other specified B group vitamins: Secondary | ICD-10-CM | POA: Diagnosis not present

## 2023-08-11 DIAGNOSIS — E559 Vitamin D deficiency, unspecified: Secondary | ICD-10-CM | POA: Diagnosis not present

## 2023-08-11 DIAGNOSIS — D509 Iron deficiency anemia, unspecified: Secondary | ICD-10-CM | POA: Diagnosis not present

## 2023-08-11 MED ORDER — ACETAMINOPHEN 325 MG PO TABS
650.0000 mg | ORAL_TABLET | Freq: Once | ORAL | Status: AC
Start: 2023-08-11 — End: 2023-08-11
  Administered 2023-08-11: 650 mg via ORAL
  Filled 2023-08-11 (×2): qty 2

## 2023-08-11 MED ORDER — SODIUM CHLORIDE 0.9 % IV SOLN: Freq: Once | INTRAVENOUS | Status: AC

## 2023-08-11 MED ORDER — SODIUM CHLORIDE 0.9 % IV SOLN
300.0000 mg | Freq: Once | INTRAVENOUS | Status: AC
  Administered 2023-08-11: 300 mg via INTRAVENOUS
  Filled 2023-08-11: qty 300

## 2023-08-11 MED ORDER — DIPHENHYDRAMINE HCL 25 MG PO CAPS
25.0000 mg | ORAL_CAPSULE | Freq: Once | ORAL | Status: AC
Start: 2023-08-11 — End: 2023-08-11
  Administered 2023-08-11: 25 mg via ORAL
  Filled 2023-08-11: qty 1

## 2023-08-11 NOTE — Patient Instructions (Signed)

## 2023-08-23 ENCOUNTER — Telehealth: Payer: Self-pay | Admitting: *Deleted

## 2023-08-23 ENCOUNTER — Inpatient Hospital Stay: Payer: Medicare Other | Attending: Hematology and Oncology

## 2023-08-23 VITALS — BP 119/67 | HR 96 | Temp 98.3°F | Resp 17 | Ht 62.0 in | Wt 162.8 lb

## 2023-08-23 DIAGNOSIS — R5382 Chronic fatigue, unspecified: Secondary | ICD-10-CM | POA: Diagnosis not present

## 2023-08-23 DIAGNOSIS — E538 Deficiency of other specified B group vitamins: Secondary | ICD-10-CM | POA: Insufficient documentation

## 2023-08-23 MED ORDER — CYANOCOBALAMIN 1000 MCG/ML IJ SOLN
1000.0000 ug | Freq: Once | INTRAMUSCULAR | Status: AC
  Administered 2023-08-23: 1000 ug via INTRAMUSCULAR
  Filled 2023-08-23: qty 1

## 2023-08-23 NOTE — Patient Instructions (Signed)
 Vitamin B12 Deficiency Vitamin B12 deficiency occurs when the body does not have enough of this important vitamin. The body needs this vitamin: To make red blood cells. To make DNA. This is the genetic material inside cells. To help the nerves work properly so they can carry messages from the brain to the body. Vitamin B12 deficiency can cause health problems, such as not having enough red blood cells in the blood (anemia). This can lead to nerve damage if untreated. What are the causes? This condition may be caused by: Not eating enough foods that contain vitamin B12. Not having enough stomach acid and digestive fluids to properly absorb vitamin B12 from the food that you eat. Having certain diseases that make it hard to absorb vitamin B12. These diseases include Crohn's disease, chronic pancreatitis, and cystic fibrosis. An autoimmune disorder in which the body does not make enough of a protein (intrinsic factor) within the stomach, resulting in not enough absorption of vitamin B12. Having a surgery in which part of the stomach or small intestine is removed. Taking certain medicines that make it hard for the body to absorb vitamin B12. These include: Heartburn medicines, such as antacids and proton pump inhibitors. Some medicines that are used to treat diabetes. What increases the risk? The following factors may make you more likely to develop a vitamin B12 deficiency: Being an older adult. Eating a vegetarian or vegan diet that does not include any foods that come from animals. Eating a poor diet while you are pregnant. Taking certain medicines. Having alcoholism. What are the signs or symptoms? In some cases, there are no symptoms of this condition. If the condition leads to anemia or nerve damage, various symptoms may occur, such as: Weakness. Tiredness (fatigue). Loss of appetite. Numbness or tingling in your hands and feet. Redness and burning of the tongue. Depression,  confusion, or memory problems. Trouble walking. If anemia is severe, symptoms can include: Shortness of breath. Dizziness. Rapid heart rate. How is this diagnosed? This condition may be diagnosed with a blood test to measure the level of vitamin B12 in your blood. You may also have other tests, including: A group of tests that measure certain characteristics of blood cells (complete blood count, CBC). A blood test to measure intrinsic factor. A procedure where a thin tube with a camera on the end is used to look into your stomach or intestines (endoscopy). Other tests may be needed to discover the cause of the deficiency. How is this treated? Treatment for this condition depends on the cause. This condition may be treated by: Changing your eating and drinking habits, such as: Eating more foods that contain vitamin B12. Drinking less alcohol or no alcohol. Getting vitamin B12 injections. Taking vitamin B12 supplements by mouth (orally). Your health care provider will tell you which dose is best for you. Follow these instructions at home: Eating and drinking  Include foods in your diet that come from animals and contain a lot of vitamin B12. These include: Meats and poultry. This includes beef, pork, chicken, [place], and organ meats, such as liver. Seafood. This includes clams, rainbow trout, salmon, tuna, and haddock. Eggs. Dairy foods such as milk, yogurt, and cheese. Eat foods that have vitamin B12 added to them (are fortified), such as ready-to-eat breakfast cereals. Check the label on the package to see if a food is fortified. The items listed above may not be a complete list of foods and beverages you can eat and drink. Contact a dietitian for  more information. Alcohol use Do not drink alcohol if: Your health care provider tells you not to drink. You are pregnant, may be pregnant, or are planning to become pregnant. If you drink alcohol: Limit how much you have to: 0-1 drink a  day for women. 0-2 drinks a day for men. Know how much alcohol is in your drink. In the [place] one drink equals one 12 oz bottle of beer (355 mL), one 5 oz glass of wine (148 mL), or one 1 oz glass of hard liquor (44 mL). General instructions Get vitamin B12 injections if told to by your health care provider. Take supplements only as told by your health care provider. Follow the directions carefully. Keep all follow-up visits. This is important. Contact a health care provider if: Your symptoms come back. Your symptoms get worse or do not improve with treatment. Get help right away: You develop shortness of breath. You have a rapid heart rate. You have chest pain. You become dizzy or you faint. These symptoms may be an emergency. Get help right away. Call 911. Do not wait to see if the symptoms will go away. Do not drive yourself to the hospital. Summary Vitamin B12 deficiency occurs when the body does not have enough of this important vitamin. Common causes include not eating enough foods that contain vitamin B12, not being able to absorb vitamin B12 from the food that you eat, having a surgery in which part of the stomach or small intestine is removed, or taking certain medicines. Eat foods that have vitamin B12 in them. Treatment may include making a change in the way you eat and drink, getting vitamin B12 injections, or taking vitamin B12 supplements. This information is not intended to replace advice given to you by your health care provider. Make sure you discuss any questions you have with your health care provider. Document Revised: 02/21/2021 Document Reviewed: 02/21/2021 Elsevier Patient Education  2024 [employer].

## 2023-08-23 NOTE — Telephone Encounter (Signed)
 Patient contacted the office and left message requesting a call back to advise if our office tests for POTS or MS.   Returned call to patient to advised we do not test for MS or POTS. Patient advised she should reach out to her primary care provider. Patient expressed understanding.

## 2023-08-23 NOTE — Progress Notes (Signed)
 Patient states she was told to come in today for 8am for her infusion appt. She can not stay for her iron  because she needs to get her child. She will reschedule this appt. She did receive her B12 today.

## 2023-08-25 ENCOUNTER — Encounter: Payer: Self-pay | Admitting: Hematology and Oncology

## 2023-09-10 DIAGNOSIS — M79642 Pain in left hand: Secondary | ICD-10-CM | POA: Diagnosis not present

## 2023-09-16 DIAGNOSIS — G5623 Lesion of ulnar nerve, bilateral upper limbs: Secondary | ICD-10-CM | POA: Diagnosis not present

## 2023-09-17 DIAGNOSIS — Z79899 Other long term (current) drug therapy: Secondary | ICD-10-CM | POA: Diagnosis not present

## 2023-09-20 ENCOUNTER — Inpatient Hospital Stay: Payer: Medicare Other | Attending: Hematology and Oncology

## 2023-09-20 DIAGNOSIS — E538 Deficiency of other specified B group vitamins: Secondary | ICD-10-CM | POA: Diagnosis not present

## 2023-09-20 DIAGNOSIS — D509 Iron deficiency anemia, unspecified: Secondary | ICD-10-CM | POA: Insufficient documentation

## 2023-09-20 DIAGNOSIS — R5382 Chronic fatigue, unspecified: Secondary | ICD-10-CM | POA: Diagnosis not present

## 2023-09-20 MED ORDER — CYANOCOBALAMIN 1000 MCG/ML IJ SOLN
1000.0000 ug | Freq: Once | INTRAMUSCULAR | Status: AC
  Administered 2023-09-20: 1000 ug via INTRAMUSCULAR
  Filled 2023-09-20: qty 1

## 2023-09-25 ENCOUNTER — Emergency Department (HOSPITAL_COMMUNITY): Admission: EM | Admit: 2023-09-25 | Discharge: 2023-09-26 | Disposition: A | Discharge status: Home / Self Care

## 2023-09-25 DIAGNOSIS — M25521 Pain in right elbow: Secondary | ICD-10-CM | POA: Diagnosis not present

## 2023-09-25 DIAGNOSIS — R231 Pallor: Secondary | ICD-10-CM | POA: Diagnosis not present

## 2023-09-25 DIAGNOSIS — I959 Hypotension, unspecified: Secondary | ICD-10-CM | POA: Diagnosis not present

## 2023-09-25 DIAGNOSIS — S5011XA Contusion of right forearm, initial encounter: Secondary | ICD-10-CM | POA: Diagnosis not present

## 2023-09-25 DIAGNOSIS — E039 Hypothyroidism, unspecified: Secondary | ICD-10-CM | POA: Diagnosis not present

## 2023-09-25 DIAGNOSIS — Z79899 Other long term (current) drug therapy: Secondary | ICD-10-CM | POA: Diagnosis not present

## 2023-09-25 DIAGNOSIS — R2230 Localized swelling, mass and lump, unspecified upper limb: Secondary | ICD-10-CM | POA: Diagnosis present

## 2023-09-25 DIAGNOSIS — R112 Nausea with vomiting, unspecified: Secondary | ICD-10-CM | POA: Insufficient documentation

## 2023-09-25 DIAGNOSIS — E860 Dehydration: Secondary | ICD-10-CM | POA: Diagnosis not present

## 2023-09-25 DIAGNOSIS — G4489 Other headache syndrome: Secondary | ICD-10-CM | POA: Diagnosis not present

## 2023-09-25 DIAGNOSIS — S5012XA Contusion of left forearm, initial encounter: Secondary | ICD-10-CM | POA: Diagnosis not present

## 2023-09-25 DIAGNOSIS — M7711 Lateral epicondylitis, right elbow: Secondary | ICD-10-CM | POA: Diagnosis not present

## 2023-09-25 DIAGNOSIS — R0902 Hypoxemia: Secondary | ICD-10-CM | POA: Diagnosis not present

## 2023-09-25 DIAGNOSIS — T801XXA Vascular complications following infusion, transfusion and therapeutic injection, initial encounter: Secondary | ICD-10-CM | POA: Diagnosis not present

## 2023-09-25 DIAGNOSIS — T8089XA Other complications following infusion, transfusion and therapeutic injection, initial encounter: Secondary | ICD-10-CM | POA: Diagnosis not present

## 2023-09-25 DIAGNOSIS — Y808 Miscellaneous physical medicine devices associated with adverse incidents, not elsewhere classified: Secondary | ICD-10-CM | POA: Diagnosis not present

## 2023-09-25 NOTE — ED Triage Notes (Signed)
 Pt BIBA from home ate at restaurant around 1900, has had multiple episodes N/V nonstop since coming home. Does endorse headache. Hx autoimmune disorder. A&Ox4

## 2023-09-26 ENCOUNTER — Emergency Department (HOSPITAL_COMMUNITY)
Admission: EM | Admit: 2023-09-26 | Discharge: 2023-09-26 | Disposition: A | Discharge status: Home / Self Care | Source: Home / Self Care

## 2023-09-26 ENCOUNTER — Emergency Department (HOSPITAL_BASED_OUTPATIENT_CLINIC_OR_DEPARTMENT_OTHER)
Admit: 2023-09-26 | Discharge: 2023-09-26 | Disposition: A | Discharge status: Home / Self Care | Attending: Student | Admitting: Student

## 2023-09-26 ENCOUNTER — Encounter: Payer: Self-pay | Admitting: Hematology and Oncology

## 2023-09-26 ENCOUNTER — Other Ambulatory Visit: Payer: Self-pay

## 2023-09-26 DIAGNOSIS — S5011XA Contusion of right forearm, initial encounter: Secondary | ICD-10-CM | POA: Insufficient documentation

## 2023-09-26 DIAGNOSIS — T801XXA Vascular complications following infusion, transfusion and therapeutic injection, initial encounter: Secondary | ICD-10-CM | POA: Insufficient documentation

## 2023-09-26 DIAGNOSIS — Y808 Miscellaneous physical medicine devices associated with adverse incidents, not elsewhere classified: Secondary | ICD-10-CM | POA: Insufficient documentation

## 2023-09-26 DIAGNOSIS — Z79899 Other long term (current) drug therapy: Secondary | ICD-10-CM | POA: Insufficient documentation

## 2023-09-26 DIAGNOSIS — R112 Nausea with vomiting, unspecified: Secondary | ICD-10-CM | POA: Diagnosis not present

## 2023-09-26 DIAGNOSIS — S5012XA Contusion of left forearm, initial encounter: Secondary | ICD-10-CM | POA: Insufficient documentation

## 2023-09-26 DIAGNOSIS — M7989 Other specified soft tissue disorders: Secondary | ICD-10-CM | POA: Diagnosis not present

## 2023-09-26 DIAGNOSIS — E039 Hypothyroidism, unspecified: Secondary | ICD-10-CM | POA: Insufficient documentation

## 2023-09-26 DIAGNOSIS — T8089XA Other complications following infusion, transfusion and therapeutic injection, initial encounter: Secondary | ICD-10-CM | POA: Diagnosis not present

## 2023-09-26 LAB — COMPREHENSIVE METABOLIC PANEL
ALT: 12 U/L (ref 0–44)
AST: 22 U/L (ref 15–41)
Albumin: 4.2 g/dL (ref 3.5–5.0)
Alkaline Phosphatase: 59 U/L (ref 38–126)
Anion gap: 11 (ref 5–15)
BUN: 12 mg/dL (ref 6–20)
CO2: 23 mmol/L (ref 22–32)
Calcium: 9.2 mg/dL (ref 8.9–10.3)
Chloride: 104 mmol/L (ref 98–111)
Creatinine, Ser: 0.54 mg/dL (ref 0.44–1.00)
GFR, Estimated: >60 mL/min (ref >60)
Glucose, Bld: 107 mg/dL — ABNORMAL HIGH (ref 70–99)
Potassium: 4.1 mmol/L (ref 3.5–5.1)
Sodium: 138 mmol/L (ref 135–145)
Total Bilirubin: 1 mg/dL (ref 0.0–1.2)
Total Protein: 7.3 g/dL (ref 6.5–8.1)

## 2023-09-26 LAB — CBC WITH DIFFERENTIAL/PLATELET
Abs Immature Granulocytes: 0.1 10*3/uL — ABNORMAL HIGH (ref 0.00–0.07)
Basophils Absolute: 0.1 10*3/uL (ref 0.0–0.1)
Basophils Relative: 1 %
Eosinophils Absolute: 0.2 10*3/uL (ref 0.0–0.5)
Eosinophils Relative: 2 %
HCT: 39.5 % (ref 36.0–46.0)
Hemoglobin: 13.1 g/dL (ref 12.0–15.0)
Immature Granulocytes: 1 %
Lymphocytes Relative: 25 %
Lymphs Abs: 2.5 10*3/uL (ref 0.7–4.0)
MCH: 29.6 pg (ref 26.0–34.0)
MCHC: 33.2 g/dL (ref 30.0–36.0)
MCV: 89.4 fL (ref 80.0–100.0)
Monocytes Absolute: 0.7 10*3/uL (ref 0.1–1.0)
Monocytes Relative: 7 %
Neutro Abs: 6.5 10*3/uL (ref 1.7–7.7)
Neutrophils Relative %: 64 %
Platelets: 272 10*3/uL (ref 150–400)
RBC: 4.42 MIL/uL (ref 3.87–5.11)
RDW: 13.1 % (ref 11.5–15.5)
WBC: 10.1 10*3/uL (ref 4.0–10.5)
nRBC: 0 % (ref 0.0–0.2)

## 2023-09-26 LAB — RESP PANEL BY RT-PCR (RSV, FLU A&B, COVID)  RVPGX2
Influenza A by PCR: NEGATIVE
Influenza B by PCR: NEGATIVE
Resp Syncytial Virus by PCR: NEGATIVE
SARS Coronavirus 2 by RT PCR: NEGATIVE

## 2023-09-26 LAB — LIPASE, BLOOD: Lipase: 29 U/L (ref 11–51)

## 2023-09-26 LAB — HCG, SERUM, QUALITATIVE: Preg, Serum: NEGATIVE

## 2023-09-26 MED ORDER — KETOROLAC TROMETHAMINE 15 MG/ML IJ SOLN
15.0000 mg | Freq: Once | INTRAMUSCULAR | Status: AC
  Administered 2023-09-26: 15 mg via INTRAVENOUS
  Filled 2023-09-26: qty 1

## 2023-09-26 MED ORDER — ONDANSETRON HCL 4 MG/2ML IJ SOLN
4.0000 mg | Freq: Once | INTRAMUSCULAR | Status: AC
  Administered 2023-09-26: 4 mg via INTRAVENOUS
  Filled 2023-09-26: qty 2

## 2023-09-26 MED ORDER — SODIUM CHLORIDE 0.9 % IV BOLUS
1000.0000 mL | Freq: Once | INTRAVENOUS | Status: AC
  Administered 2023-09-26: 1000 mL via INTRAVENOUS

## 2023-09-26 MED ORDER — ACETAMINOPHEN 325 MG PO TABS
650.0000 mg | ORAL_TABLET | Freq: Once | ORAL | Status: AC
  Administered 2023-09-26: 650 mg via ORAL
  Filled 2023-09-26: qty 2

## 2023-09-26 MED ORDER — ONDANSETRON 4 MG PO TBDP: 4.0000 mg | ORAL_TABLET | Freq: Three times a day (TID) | ORAL | 0 refills | Status: AC | PRN

## 2023-09-26 NOTE — ED Provider Notes (Signed)
 [place] EMERGENCY DEPARTMENT AT [facility] Provider Note   CSN: [id number] Arrival date & time: 09/26/23  1446     History  Chief Complaint  Patient presents with   Arm Swelling    Meredith [name] is a 43 y.o. female with history of hypothyroidism, iron deficiency, presents with concern for right forearm swelling that she noticed this morning and some bruising.  States she was in the hospital last night and had an IV in that arm.  She denies any numbness or tingling in the arm or fingers.  Denies any injuries to the arm, cuts, or abrasions.  Her PCP sent her here to rule out DVT.  She denies any prolonged periods of immobilization, recent surgeries or hospitalizations, hormonal medication use, or any history of blood clots. Denies any chest pain or shortness of breath.  HPI     Home Medications Prior to Admission medications   Medication Sig Start Date End Date Taking? Authorizing Provider  cyanocobalamin (,VITAMIN B-12,) 1000 MCG/ML injection Inject into the muscle every 30 (thirty) days. Take depending on my levels    [provider]  ferrous sulfate 220 (44 Fe) MG/5ML solution Take by mouth. Infusion administered 3 every 3 months depending on levels.    [provider]  ibuprofen (ADVIL) 200 MG tablet Take 200 mg by mouth every 6 (six) hours as needed for moderate pain.    [provider]  levothyroxine (SYNTHROID) 50 MCG tablet Take 50 mcg by mouth daily before breakfast.    [provider]  ondansetron (ZOFRAN-ODT) 4 MG disintegrating tablet Take 1 tablet (4 mg total) by mouth every 8 (eight) hours as needed for nausea or vomiting. 09/26/23   Coral Spikes, DO  Semaglutide-Weight Management 0.5 MG/0.5ML SOAJ Inject 0.5 mg into the skin once a week. 05/28/23   Soundra Pilon, FNP  Vitamin D, Ergocalciferol, (DRISDOL) 1.25 MG (50000 UNIT) CAPS capsule TAKE 1 CAPSULE (50,000 UNITS TOTAL) BY MOUTH TWO TIMES A WEEK 12/18/21    Serena Croissant, MD      Allergies    Morphine and codeine, Promethazine, and Promethazine hcl    Review of Systems   Review of Systems  Physical Exam Updated Vital Signs BP 123/84 (BP Location: Left Arm)   Pulse 79   Temp 98.1 F (36.7 C) (Oral)   Resp 16   LMP 09/26/2023 (Exact Date)   SpO2 99%  Physical Exam Vitals and nursing note reviewed.  Constitutional:      General: She is not in acute distress.    Appearance: She is well-developed.  HENT:     Head: Normocephalic and atraumatic.  Eyes:     Conjunctiva/sclera: Conjunctivae normal.  Cardiovascular:     Rate and Rhythm: Normal rate and regular rhythm.     Heart sounds: No murmur heard.    Comments: Radial pulse 2+ bilaterally Pulmonary:     Effort: Pulmonary effort is normal. No respiratory distress.     Breath sounds: Normal breath sounds.  Musculoskeletal:        General: No swelling.     Cervical back: Neck supple.     Comments: Right  and left forearm with some bruising around the IV insertion site from yesterday.  No diffuse edema, forearms bilaterally appear symmetrical.   No red streaking up the arms.  No abrasions or lacerations to the right or left upper extremity  Skin:    General: Skin is warm and dry.  Capillary Refill: Capillary refill takes less than 2 seconds.  Neurological:     Mental Status: She is alert.     Comments: Intact sensation in the right and left upper extremities  Psychiatric:        Mood and Affect: Mood normal.     ED Results / Procedures / Treatments   Labs (all labs ordered are listed, but only abnormal results are displayed) Labs Reviewed - No data to display  EKG None  Radiology UE VENOUS DUPLEX (7am - 7pm) Result Date: 09/26/2023 UPPER VENOUS STUDY  Patient Name:  DOMINIC RHOME  Date of Exam:   09/26/2023 Medical Rec #: [id number]               Accession #:    [id number] Date of Birth: August 09, 1980               Patient Gender: F Patient Age:   46 years Exam  Location:  [facility] Procedure:      VAS [place] UPPER EXTREMITY VENOUS DUPLEX Referring Phys: Arabella Merles --------------------------------------------------------------------------------  Risk Factors: None identified. Comparison Study: No prior studies. Performing Technologist: Chanda Busing RVT  Examination Guidelines: A complete evaluation includes B-mode imaging, spectral Doppler, color Doppler, and power Doppler as needed of all accessible portions of each vessel. Bilateral testing is considered an integral part of a complete examination. Limited examinations for reoccurring indications may be performed as noted.  Right Findings: +----------+------------+---------+-----------+----------+-------+ RIGHT     CompressiblePhasicitySpontaneousPropertiesSummary +----------+------------+---------+-----------+----------+-------+ IJV           Full       Yes       Yes                      +----------+------------+---------+-----------+----------+-------+ Subclavian    Full       Yes       Yes                      +----------+------------+---------+-----------+----------+-------+ Axillary      Full       Yes       Yes                      +----------+------------+---------+-----------+----------+-------+ Brachial      Full                                          +----------+------------+---------+-----------+----------+-------+ Radial        Full                                          +----------+------------+---------+-----------+----------+-------+ Ulnar         Full                                          +----------+------------+---------+-----------+----------+-------+ Cephalic      Full                                          +----------+------------+---------+-----------+----------+-------+ Basilic       Full                                          +----------+------------+---------+-----------+----------+-------+  Left Findings:  +----------+------------+---------+-----------+----------+-------+ LEFT      CompressiblePhasicitySpontaneousPropertiesSummary +----------+------------+---------+-----------+----------+-------+ Subclavian    Full       Yes       Yes                      +----------+------------+---------+-----------+----------+-------+  Summary:  Right: No evidence of deep vein thrombosis in the upper extremity. No evidence of superficial vein thrombosis in the upper extremity.  Left: No evidence of thrombosis in the subclavian.  *See table(s) above for measurements and observations.    Preliminary     Procedures Procedures    Medications Ordered in ED Medications - No data to display  ED Course/ Medical Decision Making/ A&P                                 Medical Decision Making    Differential diagnosis includes but is not limited to IV infiltration, DVT, cellulitis  ED Course:  Patient well-appearing, stable vital signs.  She was here in the ER couple hours ago and had an IV placed in her right arm.  She now reports concern that her right forearm is more swollen than normal.  She does have some bruising around where the IV insertion site was.  Forearms appear about symmetric bilaterally, but she reports some tenderness to the right forearm.  No overlying wounds, erythema, to suggest infection.  DVT study negative.  Neurovascularly intact in the right upper extremity.  Suspect her symptoms are secondary to IV infiltration.    Patient stable and appropriate for discharge home.  Impression: IV infiltration in the right forearm  Disposition:  The patient was discharged home with instructions to allow the fluid to reabsorb over the next couple of days.  You may apply ice to the area to help with pain and swelling.  Follow-up with PCP if symptoms not improving. Return precautions given.  Imaging Studies ordered: I ordered imaging studies including right upper extremity ultrasound I  independently visualized the imaging with scope of interpretation limited to determining acute life threatening conditions related to emergency care. Imaging showed no DVT I agree with the radiologist interpretation   External records from outside source obtained and reviewed including ER note from earlier this morning where she was in the ER and had an IV placed              Final Clinical Impression(s) / ED Diagnoses Final diagnoses:  IV infiltration, initial encounter    Rx / DC Orders ED Discharge Orders     None         Arabella Merles, PA-C 09/26/23 1551    Loetta Rough, MD 09/26/23 1614

## 2023-09-26 NOTE — ED Notes (Signed)
 Provided pt with saltine crackers and ice water for PO challenge.

## 2023-09-26 NOTE — Progress Notes (Signed)
 Right upper extremity venous duplex has been completed. Preliminary results can be found in CV Proc through chart review.  Results were given to Arabella Merles PA.  09/26/23 3:46 PM Olen Cordial RVT

## 2023-09-26 NOTE — ED Triage Notes (Signed)
 Pt referred to ED from PCP to r/o DVT in her R arm. Pt states that she had an IV infiltrate last night when in the ED and now has swelling, tightness, and pain to her R forearm area distal from the site.

## 2023-09-26 NOTE — Discharge Instructions (Signed)
 We are prescribing you antinausea medications.  Please try to maintain adequate hydration with frequent sips of clear liquids such as water or low sugar Gatorade.  Return immediately if your headache worsens, vision changes, facial droop, chest pain, shortness of breath, worsening abdominal pain, inability to eat or drink due to nausea vomiting, lightheadedness, passout or any new or worsening symptoms that are concerning to you.

## 2023-09-26 NOTE — ED Notes (Signed)
 Checked after PO challenge and pt was able to keep water down.

## 2023-09-26 NOTE — ED Notes (Signed)
 Water given for PO challenge.

## 2023-09-26 NOTE — ED Provider Notes (Signed)
 [place] EMERGENCY DEPARTMENT AT [facility] Provider Note   CSN: [id number] Arrival date & time: 09/25/23  2354     History  Chief Complaint  Patient presents with   Emesis    Meredith [name] is a 43 y.o. female.  This is a 43 year old female presenting emergency department after eating a Asian restaurant this evening.  Went home had abrupt onset of generalized malaise, headache, and nausea vomiting.  Has a history of headaches, feels similar to prior.  No thunderclap or rapid onset.  Reports nausea and vomiting only.  No abdominal pain.   Emesis      Home Medications Prior to Admission medications   Medication Sig Start Date End Date Taking? Authorizing Provider  cyanocobalamin (,VITAMIN B-12,) 1000 MCG/ML injection Inject into the muscle every 30 (thirty) days. Take depending on my levels    [provider]  ferrous sulfate 220 (44 Fe) MG/5ML solution Take by mouth. Infusion administered 3 every 3 months depending on levels.    [provider]  ibuprofen (ADVIL) 200 MG tablet Take 200 mg by mouth every 6 (six) hours as needed for moderate pain.    [provider]  levothyroxine (SYNTHROID) 50 MCG tablet Take 50 mcg by mouth daily before breakfast.    [provider]  ondansetron (ZOFRAN-ODT) 4 MG disintegrating tablet Take 1 tablet (4 mg total) by mouth every 8 (eight) hours as needed for nausea or vomiting. 09/13/22   Jeannie Fend, PA-C  Semaglutide-Weight Management 0.5 MG/0.5ML SOAJ Inject 0.5 mg into the skin once a week. 05/28/23   Soundra Pilon, FNP  Vitamin D, Ergocalciferol, (DRISDOL) 1.25 MG (50000 UNIT) CAPS capsule TAKE 1 CAPSULE (50,000 UNITS TOTAL) BY MOUTH TWO TIMES A WEEK 12/18/21   Serena Croissant, MD      Allergies    Morphine and codeine, Promethazine, and Promethazine hcl    Review of Systems   Review of Systems  Gastrointestinal:  Positive for vomiting.    Physical Exam Updated Vital Signs BP  120/79   Pulse 84   Temp 98.6 F (37 C) (Oral)   Resp 12   LMP 09/26/2023 (Exact Date)   SpO2 100%  Physical Exam Vitals and nursing note reviewed.  Constitutional:      General: She is not in acute distress.    Appearance: She is not toxic-appearing.  HENT:     Head: Normocephalic.     Nose: Nose normal.     Mouth/Throat:     Mouth: Mucous membranes are moist.  Eyes:     Conjunctiva/sclera: Conjunctivae normal.  Cardiovascular:     Rate and Rhythm: Normal rate and regular rhythm.  Pulmonary:     Effort: Pulmonary effort is normal.     Breath sounds: Normal breath sounds.  Abdominal:     General: Abdomen is flat. There is no distension.     Palpations: Abdomen is soft.     Tenderness: There is no abdominal tenderness. There is no guarding or rebound.  Musculoskeletal:        General: Normal range of motion.  Skin:    General: Skin is warm and dry.     Capillary Refill: Capillary refill takes less than 2 seconds.  Neurological:     Mental Status: She is alert and oriented to person, place, and time.  Psychiatric:        Mood and Affect: Mood normal.        Behavior: Behavior normal.  ED Results / Procedures / Treatments   Labs (all labs ordered are listed, but only abnormal results are displayed) Labs Reviewed  CBC WITH DIFFERENTIAL/PLATELET - Abnormal; Notable for the following components:      Result Value   Abs Immature Granulocytes 0.10 (*)    All other components within normal limits  COMPREHENSIVE METABOLIC PANEL - Abnormal; Notable for the following components:   Glucose, Bld 107 (*)    All other components within normal limits  RESP PANEL BY RT-PCR (RSV, FLU A&B, COVID)  RVPGX2  LIPASE, BLOOD  HCG, SERUM, QUALITATIVE    EKG None  Radiology No results found.  Procedures Procedures    Medications Ordered in ED Medications  ondansetron (ZOFRAN) injection 4 mg (4 mg Intravenous Given 09/26/23 0040)  sodium chloride 0.9 % bolus 1,000 mL (1,000  mLs Intravenous New Bag/Given 09/26/23 0041)  acetaminophen (TYLENOL) tablet 650 mg (650 mg Oral Given 09/26/23 0135)  ketorolac (TORADOL) 15 MG/ML injection 15 mg (15 mg Intravenous Given 09/26/23 0134)    ED Course/ Medical Decision Making/ A&P                                 Medical Decision Making This is a 43 year old female presenting emergency department with nausea vomiting does have a history of gastritis, headache.  She is afebrile nontachycardic hemodynamically stable.  Benign abdominal exam.  History consistent with possible food poisoning versus viral influenza.  No red flags for her headache.  Normal neuroexam.  Basic labs without evidence of dehydration.  Normal kidney function.  No elevation in her LFTs or lipase to suggest acute pancreatitis or hepatobiliary disease.  No fever tachycardia or leukocytosis to suggest systemic infection.  Pregnancy test is negative.  Hyperemesis gravidarum or ectopic pregnancy unlikely.  Treated with IV fluids, Tylenol Motrin and Zofran.  She is tolerating p.o. here in the emergency department.  She would like to be tested for flu COVID as she does have small children at home.  Discussed treatment with Tamiflu/Paxlovid, declined treatment.  She will follow the results on MyChart for flu/COVID.  Stable for discharge at this time.  Amount and/or Complexity of Data Reviewed External Data Reviewed:     Details: Appears to have had C-sections, but no other abdominal surgeries.  Low suspicion for obstruction on exam Labs: ordered. Decision-making details documented in ED Course. Radiology:     Details: Considered CT abdomen, however reassuring vitals and exam with a history consistent with viral process versus acute food poisoning.  Low suspicion for acute surgical pathology.  Will forego CT scan at this time.  Risk OTC drugs. Prescription drug management. Decision regarding hospitalization.          Final Clinical Impression(s) / ED  Diagnoses Final diagnoses:  None    Rx / DC Orders ED Discharge Orders     None         Coral Spikes, DO 09/26/23 0209    Coral Spikes, DO 10/01/23 1436

## 2023-09-26 NOTE — Discharge Instructions (Addendum)
 Your ultrasound did not show any signs of a blood clot.  The swelling in your arm is likely secondary to the IV and some fluid leakage around that site.  This will reabsorb in the next couple of days.  You may apply ice to the area to help with pain and swelling.  Return to the ER for any numbness or tingling in your fingers, worsening of your swelling, any other new or concerning symptoms.

## 2023-10-18 ENCOUNTER — Encounter: Payer: Self-pay | Admitting: Hematology and Oncology

## 2023-10-19 ENCOUNTER — Inpatient Hospital Stay: Payer: Medicare Other | Attending: Hematology and Oncology

## 2023-10-19 DIAGNOSIS — D509 Iron deficiency anemia, unspecified: Secondary | ICD-10-CM | POA: Diagnosis not present

## 2023-10-19 DIAGNOSIS — D519 Vitamin B12 deficiency anemia, unspecified: Secondary | ICD-10-CM | POA: Insufficient documentation

## 2023-10-19 LAB — CBC WITH DIFFERENTIAL (CANCER CENTER ONLY)
Abs Immature Granulocytes: 0.02 10*3/uL (ref 0.00–0.07)
Basophils Absolute: 0.1 10*3/uL (ref 0.0–0.1)
Basophils Relative: 1 %
Eosinophils Absolute: 0.2 10*3/uL (ref 0.0–0.5)
Eosinophils Relative: 2 %
HCT: 38.9 % (ref 36.0–46.0)
Hemoglobin: 12.8 g/dL (ref 12.0–15.0)
Immature Granulocytes: 0 %
Lymphocytes Relative: 26 %
Lymphs Abs: 2.1 10*3/uL (ref 0.7–4.0)
MCH: 29.1 pg (ref 26.0–34.0)
MCHC: 32.9 g/dL (ref 30.0–36.0)
MCV: 88.4 fL (ref 80.0–100.0)
Monocytes Absolute: 0.6 10*3/uL (ref 0.1–1.0)
Monocytes Relative: 7 %
Neutro Abs: 5.3 10*3/uL (ref 1.7–7.7)
Neutrophils Relative %: 64 %
Platelet Count: 282 10*3/uL (ref 150–400)
RBC: 4.4 MIL/uL (ref 3.87–5.11)
RDW: 13.2 % (ref 11.5–15.5)
WBC Count: 8.2 10*3/uL (ref 4.0–10.5)
nRBC: 0 % (ref 0.0–0.2)

## 2023-10-19 LAB — IRON AND IRON BINDING CAPACITY (CC-WL,HP ONLY)
Iron: 57 ug/dL (ref 28–170)
Saturation Ratios: 16 % (ref 10.4–31.8)
TIBC: 364 ug/dL (ref 250–450)
UIBC: 307 ug/dL (ref 148–442)

## 2023-10-19 LAB — FERRITIN: Ferritin: 17 ng/mL (ref 11–307)

## 2023-10-19 LAB — VITAMIN B12: Vitamin B-12: 321 pg/mL (ref 180–914)

## 2023-10-20 NOTE — Assessment & Plan Note (Signed)
 Bone marrow biopsy 07/03/2020: 50 to 60% cellularity, normocellular bone marrow with trilineage hematopoiesis.  No dyspoietic or dysplastic changes noted Cytogenetics Neg Recommendation: Sublingual B12 5000 mcg twice a week Severe vitamin D deficiency:  50,000 units of vitamin D twice a week.  Vitamin D levels continue to be low   Severe and profound fatigue: She applied for disability and it was denied. Lab review:  06/25/2021: WBC 12.2, MCV 85.9, ferritin 4, iron saturation 9%, B12 506, folate > 5.9  09/23/2021: Hemoglobin 13.3, MCV 87, B12 466,  iron studies are normal with a ferritin of 36 12/19/21: Iron Sat: 7%, Vit D 25.93, Hb 13.6, Ferritin 6  03/20/2022: Hemoglobin 12.8, MCV 87.4, B12 383, iron saturation 6%, ferritin 52 07/07/2022: Hemoglobin 13.2, MCV 86.6, vitamin D23.51, B12 422, iron saturation 8%, ferritin 6 10/26/2022: Hemoglobin 13.9, MCV 87.4, vitamin D29.4, B12 470, iron saturation 16%, ferritin 37 01/12/2023: Hemoglobin 12.5 MCV 89.5, iron saturation 14%, ferritin 770 and 132, zinc 82, copper 95 (normal), B12 564, vitamin D 29.49 04/13/2023: Hemoglobin 13.2, iron saturation 16%, ferritin 50 10/19/2023: Hemoglobin 12.8, iron saturation 16%, ferritin 17, B12 321   IV Iron: 06/2021, June 2023, January 2024, July 2024   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 Could be autoimmune in nature. She cannot work or function on a day to day basis because of this condition.   Continue with monthly B12 injections No role of additional IV iron at this time.

## 2023-10-21 ENCOUNTER — Inpatient Hospital Stay: Payer: Medicare Other

## 2023-10-21 ENCOUNTER — Inpatient Hospital Stay (HOSPITAL_BASED_OUTPATIENT_CLINIC_OR_DEPARTMENT_OTHER): Payer: Medicare Other | Admitting: Hematology and Oncology

## 2023-10-21 VITALS — BP 112/78 | HR 90 | Temp 98.1°F | Resp 18 | Ht 62.0 in | Wt 159.2 lb

## 2023-10-21 DIAGNOSIS — D509 Iron deficiency anemia, unspecified: Secondary | ICD-10-CM

## 2023-10-21 DIAGNOSIS — E538 Deficiency of other specified B group vitamins: Secondary | ICD-10-CM

## 2023-10-21 DIAGNOSIS — D519 Vitamin B12 deficiency anemia, unspecified: Secondary | ICD-10-CM | POA: Diagnosis not present

## 2023-10-21 MED ORDER — CYANOCOBALAMIN 1000 MCG/ML IJ SOLN
1000.0000 ug | Freq: Once | INTRAMUSCULAR | Status: AC
  Administered 2023-10-21: 1000 ug via INTRAMUSCULAR
  Filled 2023-10-21: qty 1

## 2023-10-21 NOTE — Progress Notes (Signed)
 Patient Care Team: Soundra Pilon, FNP as PCP - General (Family Medicine)  DIAGNOSIS:  Encounter Diagnosis  Name Primary?   Iron deficiency anemia, unspecified iron deficiency anemia type Yes    SUMMARY OF ONCOLOGIC HISTORY: Oncology History   No history exists.    CHIEF COMPLIANT: Follow-up of iron and B12 deficiency anemias  HISTORY OF PRESENT ILLNESS:   History of Present Illness The patient, with a history of iron and B12 deficiency anemias is here for follow-up.  She has history of fibromyalgia, presents with a month-long history of right elbow pain. The pain is severe enough to cause her to drop heavy objects. She denies any precipitating injury. The pain is localized to the elbow and sometimes radiates to the wrist. She has a history of similar, but shorter-lived, pain in the left elbow.  In addition to the elbow pain, she reports increased fatigue over the past two weeks. She has a history of B12 deficiency and iron deficiency anemia, both of which have been treated with injections and oral supplementation, respectively. She expresses concern about a drop in her B12 levels despite regular injections, and wonders if she has a persistent deficiency.  She also mentions a history of wet hands, but it is unclear if this is a new or ongoing issue. She continues to have chronic fatigue    ALLERGIES:  is allergic to morphine and codeine, promethazine, and promethazine hcl.  MEDICATIONS:  Current Outpatient Medications  Medication Sig Dispense Refill   cyanocobalamin (,VITAMIN B-12,) 1000 MCG/ML injection Inject into the muscle every 30 (thirty) days. Take depending on my levels     levothyroxine (SYNTHROID) 50 MCG tablet Take 50 mcg by mouth daily before breakfast.     Semaglutide-Weight Management 0.5 MG/0.5ML SOAJ Inject 0.5 mg into the skin once a week. 2 mL 0   Vitamin D, Ergocalciferol, (DRISDOL) 1.25 MG (50000 UNIT) CAPS capsule TAKE 1 CAPSULE (50,000 UNITS TOTAL) BY  MOUTH TWO TIMES A WEEK 24 capsule 3   ibuprofen (ADVIL) 200 MG tablet Take 200 mg by mouth every 6 (six) hours as needed for moderate pain. (Patient not taking: Reported on 10/21/2023)     ondansetron (ZOFRAN-ODT) 4 MG disintegrating tablet Take 1 tablet (4 mg total) by mouth every 8 (eight) hours as needed for nausea or vomiting. (Patient not taking: Reported on 10/21/2023) 14 tablet 0   No current facility-administered medications for this visit.    PHYSICAL EXAMINATION: ECOG PERFORMANCE STATUS: 1 - Symptomatic but completely ambulatory  Vitals:   10/21/23 0938  BP: 112/78  Pulse: 90  Resp: 18  Temp: 98.1 F (36.7 C)  SpO2: 100%   Filed Weights   10/21/23 0938  Weight: 159 lb 3.2 oz (72.2 kg)      LABORATORY DATA:  I have reviewed the data as listed    Latest Ref Rng & Units 09/26/2023   12:14 AM 07/19/2023   10:07 AM 04/13/2023    9:51 AM  CMP  Glucose 70 - 99 mg/dL 409  811  914   BUN 6 - 20 mg/dL 12  13  12    Creatinine 0.44 - 1.00 mg/dL 7.82  9.56  2.13   Sodium 135 - 145 mmol/L 138  138  141   Potassium 3.5 - 5.1 mmol/L 4.1  3.9  3.9   Chloride 98 - 111 mmol/L 104  107  106   CO2 22 - 32 mmol/L 23  24  29    Calcium 8.9 - 10.3  mg/dL 9.2  9.5  9.6   Total Protein 6.5 - 8.1 g/dL 7.3  7.2  6.9   Total Bilirubin 0.0 - 1.2 mg/dL 1.0  0.4  0.3   Alkaline Phos 38 - 126 U/L 59  61  66   AST 15 - 41 U/L 22  12  10    ALT 0 - 44 U/L 12  12  10      Lab Results  Component Value Date   WBC 8.2 10/19/2023   HGB 12.8 10/19/2023   HCT 38.9 10/19/2023   MCV 88.4 10/19/2023   PLT 282 10/19/2023   NEUTROABS 5.3 10/19/2023    ASSESSMENT & PLAN:  Iron deficiency anemia Bone marrow biopsy 07/03/2020: 50 to 60% cellularity, normocellular bone marrow with trilineage hematopoiesis.  No dyspoietic or dysplastic changes noted Cytogenetics Neg Recommendation: Sublingual B12 5000 mcg twice a week Severe vitamin D deficiency:  50,000 units of vitamin D twice a week.  Vitamin D levels  continue to be low   Severe and profound fatigue: She applied for disability and it was denied. Lab review:  06/25/2021: WBC 12.2, MCV 85.9, ferritin 4, iron saturation 9%, B12 506, folate > 5.9  09/23/2021: Hemoglobin 13.3, MCV 87, B12 466,  iron studies are normal with a ferritin of 36 12/19/21: Iron Sat: 7%, Vit D 25.93, Hb 13.6, Ferritin 6  03/20/2022: Hemoglobin 12.8, MCV 87.4, B12 383, iron saturation 6%, ferritin 52 07/07/2022: Hemoglobin 13.2, MCV 86.6, vitamin D23.51, B12 422, iron saturation 8%, ferritin 6 10/26/2022: Hemoglobin 13.9, MCV 87.4, vitamin D29.4, B12 470, iron saturation 16%, ferritin 37 01/12/2023: Hemoglobin 12.5 MCV 89.5, iron saturation 14%, ferritin 770 and 132, zinc 82, copper 95 (normal), B12 564, vitamin D 29.49 04/13/2023: Hemoglobin 13.2, iron saturation 16%, ferritin 50 10/19/2023: Hemoglobin 12.8, iron saturation 16%, ferritin 17, B12 321   IV Iron: 06/2021, June 2023, January 2024, July 2024   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 Could be autoimmune in nature. She cannot work or function on a day to day basis because of this condition.   Continue with monthly B12 injections No role of additional IV iron at this time.   Assessment & Plan Fibromyalgia Fibromyalgia may contribute to her elbow pain and fatigue.  Elbow pain Persistent elbow pain for one month, worsened by lifting. X-ray showed no abnormalities. Differential diagnosis includes tendonitis or fibromyalgia-related pain. - Proceed with nerve test as scheduled by orthopedic specialist.  Chronic fatigue syndrome Increased fatigue over the past two weeks. Iron levels improved. B12 level adequate. Discussed increasing B12 injection frequency but will continue current regimen. - Continue monthly B12 injections. - Repeat labs in three months to monitor iron and B12 levels.      Orders Placed This Encounter  Procedures   CBC with Differential (Cancer Center Only)     Standing Status:   Future    Expiration Date:   10/20/2024   Ferritin    Standing Status:   Future    Expiration Date:   10/20/2024   Iron and Iron Binding Capacity ([facility] only)    Standing Status:   Future    Expiration Date:   10/20/2024   Vitamin B12    Standing Status:   Future    Expiration Date:   10/20/2024   The patient has a good understanding of the overall plan. she agrees with it. she will call with any problems that may develop before the next visit here. Total time spent: 30  mins including face to face time and time spent for planning, charting and co-ordination of care   Tamsen Meek, MD 10/21/23

## 2023-10-25 DIAGNOSIS — G5623 Lesion of ulnar nerve, bilateral upper limbs: Secondary | ICD-10-CM | POA: Diagnosis not present

## 2023-11-17 DIAGNOSIS — G5623 Lesion of ulnar nerve, bilateral upper limbs: Secondary | ICD-10-CM | POA: Diagnosis not present

## 2023-11-17 DIAGNOSIS — M7711 Lateral epicondylitis, right elbow: Secondary | ICD-10-CM | POA: Diagnosis not present

## 2023-11-19 ENCOUNTER — Inpatient Hospital Stay: Attending: Hematology and Oncology

## 2023-11-19 DIAGNOSIS — E538 Deficiency of other specified B group vitamins: Secondary | ICD-10-CM

## 2023-11-19 DIAGNOSIS — D519 Vitamin B12 deficiency anemia, unspecified: Secondary | ICD-10-CM | POA: Insufficient documentation

## 2023-11-19 MED ORDER — CYANOCOBALAMIN 1000 MCG/ML IJ SOLN
1000.0000 ug | Freq: Once | INTRAMUSCULAR | Status: AC
  Administered 2023-11-19: 1000 ug via INTRAMUSCULAR
  Filled 2023-11-19: qty 1

## 2023-12-13 ENCOUNTER — Encounter: Payer: Self-pay | Admitting: Internal Medicine

## 2023-12-13 ENCOUNTER — Ambulatory Visit (INDEPENDENT_AMBULATORY_CARE_PROVIDER_SITE_OTHER): Admitting: Internal Medicine

## 2023-12-13 VITALS — BP 101/70 | HR 91 | Ht 62.0 in | Wt 156.0 lb

## 2023-12-13 DIAGNOSIS — E539 Vitamin B deficiency, unspecified: Secondary | ICD-10-CM | POA: Diagnosis not present

## 2023-12-13 DIAGNOSIS — R0602 Shortness of breath: Secondary | ICD-10-CM

## 2023-12-13 DIAGNOSIS — R5383 Other fatigue: Secondary | ICD-10-CM | POA: Diagnosis not present

## 2023-12-13 DIAGNOSIS — Z87891 Personal history of nicotine dependence: Secondary | ICD-10-CM

## 2023-12-13 DIAGNOSIS — R911 Solitary pulmonary nodule: Secondary | ICD-10-CM

## 2023-12-13 DIAGNOSIS — D509 Iron deficiency anemia, unspecified: Secondary | ICD-10-CM | POA: Diagnosis not present

## 2023-12-13 NOTE — Progress Notes (Signed)
 Meredith [name]    [id number]    1980-11-05  Primary Care Physician:Brake, Liane Redman, FNP  Referring Physician: Alejandro Hurt, FNP [phone] [address] [place],  [place] [address] Reason for Consultation: shortness of breath Date of Consultation: 12/13/2023  Chief complaint:   Chief Complaint  Patient presents with   Consult    sob     HPI: Discussed the use of AI scribe software for clinical note transcription with the patient, who gave verbal consent to proceed.  History of Present Illness Meredith [name] is a 42 year old female with multiple autoimmune disorders who presents with worsening shortness of breath.  She has experienced intermittent shortness of breath over the past four years, with a noted worsening of symptoms. The shortness of breath occurs during activities such as walking from the parking lot to a doctor's office and while talking for extended periods. No chest tightness, wheezing, or coughing. She has not used any inhalers or breathing treatments in the past.  She has a history of lung nodules, with one nodule in each lung measuring two millimeters and four millimeters, respectively. These nodules were identified approximately four years ago, and she is concerned they may be contributing to her shortness of breath.  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 She receives vitamin B12 injections and iron  infusions at a cancer center, but her body does not retain these nutrients effectively, leading to persistent fatigue and bone pain.  She has a history of COVID-19 infection, which she believes she contracted in December 2019, with  subsequent diagnoses two or three times after that. Her symptoms of fatigue and shortness of breath began after her initial COVID-19 infection.  She denies any history of smoking tobacco but mentions occasional marijuana use in her teenage years and early twenties. Her family history is not well-documented as her parents do not visit doctors. Her son is being referred to a pulmonary doctor for recurrent bronchitis.  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  Smoking history: never tobacco use, did have passive smoke exposure in childhood  Social History   Occupational History    Comment: Public house manager  Tobacco Use   Smoking status: Never    Passive exposure: Past   Smokeless tobacco: Never  Vaping Use   Vaping status: Never Used  Substance and Sexual Activity   Alcohol use: Yes    Comment: 1 or 2 times yearly   Drug use: No   Sexual activity: Yes    Partners: Male    Birth control/protection: I.U.D.    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    Chronic fatigue syndrome    DDD (degenerative disc disease), lumbar    Deficiency of other vitamins    Vitamin B6   Hypothyroidism    Hypothyroidism    Iron  deficiency    Iron  deficiency anemia    pernicious anemia   Lymphocytic gastritis    Malabsorption     Osteoarthritis    Pyelonephritis 06/21/2011   10 kidney stones surgically removed   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, none since   Vitamin B 12 deficiency    Vitamin D  deficiency     Past Surgical History:  Procedure Laterality Date   CESAREAN SECTION  07/2007   CESAREAN SECTION WITH BILATERAL TUBAL LIGATION N/A 04/07/2014   Procedure: CESAREAN SECTION WITH BILATERAL TUBAL LIGATION;  Surgeon: Christel Cousins, DO;  Location: [facility];  Service: Obstetrics;  Laterality: N/A;   COLONOSCOPY W/ ENDOSCOPIC US      cyst removal thumb Right    mirena iud N/A 2009   NEPHROLITHOTOMY  08/03/2011   Procedure: NEPHROLITHOTOMY PERCUTANEOUS;  Surgeon: Edmund Gouge, MD;  Location: [facility];  Service: Urology;  Laterality: Left;   NEPHROSTOMY  08/03/2011   left   right wrist surgery  2005   I and D , infection to the bone   WISDOM TOOTH EXTRACTION  2006     Physical Exam: Blood pressure 101/70, pulse 91, height 5\' 2"  (1.575 m), weight 156 lb (70.8 kg), SpO2 96%. Gen:      No acute distress ENT:  no nasal polyps, mucus membranes moist Lungs:    No increased respiratory effort, symmetric chest wall excursion, clear to auscultation bilaterally, no wheezes or crackles CV:         Regular rate and rhythm; no murmurs, rubs, or gallops.  No pedal edema Abd:      + bowel sounds; soft, non-tender; no distension MSK: no acute synovitis of DIP or PIP joints, no mechanics hands.  Skin:      Warm and dry; no rashes Neuro: normal speech, no focal facial asymmetry Psych: alert and oriented x3, normal mood and affect   Data Reviewed/Medical Decision Making:  Independent interpretation of tests: Imaging:  Review of patient's CT Chest images May 2021 revealed 4mm pulmonary nodule in RUL\. The patient's images have been independently reviewed by me.    PFTs:  Labs:  Lab Results  Component Value Date   WBC 8.2 10/19/2023   HGB 12.8 10/19/2023   HCT 38.9 10/19/2023   MCV 88.4 10/19/2023   PLT 282  10/19/2023   Lab Results  Component Value Date   VITAMINB12 321 10/19/2023   Lab Results  Component Value Date   NA 138 09/26/2023   K 4.1 09/26/2023   CO2 23 09/26/2023   GLUCOSE 107 (H) 09/26/2023   BUN 12 09/26/2023   CREATININE 0.54 09/26/2023   CALCIUM 9.2 09/26/2023   GFRNONAA >60 09/26/2023     Immunization status:  Immunization History  Administered Date(s) Administered   Fluzone Influenza virus vaccine,trivalent (IIV3), split virus 05/16/2013   Influenza, Seasonal, Injecte, Preservative Fre 08/24/2012, 04/22/2023   Influenza,inj,Quad PF,6+ Mos 04/08/2014, 05/02/2018, 04/26/2019, 04/30/2020, 05/08/2021, 05/15/2022   Influenza-Unspecified 04/26/2019   PPD Test 03/03/2012   Pneumococcal Polysaccharide-23 03/26/2021   Tdap 10/03/2010, 03/22/2014  I reviewed prior external note(s) from pulmonary, heme/onc  I reviewed the result(s) of the labs and imaging as noted above.   I have ordered pft   Assessment and Plan Assessment & Plan Shortness of breath Chronic worsening over four years, exacerbated by exertion and talking. Differential includes long COVID, pernicious anemia, and B12 deficiency. Lung nodules not contributing to symptoms. - Order pulmonary function tests (PFTs). - Schedule follow-up after PFTs. - consider trial of inhalers  Pulmonary nodule, 4mm, low risk patient - no further follow up needed  B12 deficiency Managed with vitamin B12 injections. Levels adequate, not contributing to shortness of breath.  Iron  deficiency anemia Managed with infusions. Hemoglobin normal, not likely contributing to shortness of breath.  Fatigue - sleep study negative 2022 - carries diagnosis of chronic fatigue syndrome  I spent 45 minutes in the care of this patient today including pre-charting, chart review, review of results, face-to-face care, coordination of care and communication with consultants etc.).   Return to Care: Return in about 6 weeks (around  01/24/2024) for next available PFT, follow up after.  Louie Rover, MD Pulmonary and Critical Care Medicine [facility] [phone]  CC: Alejandro Hurt, FNP

## 2023-12-13 NOTE — Patient Instructions (Addendum)
 It was a pleasure to see you today!  Please schedule follow up with myself in 6 weeks.  If my schedule is not open yet, we will contact you with a reminder closer to that time. Please call [phone] if you haven't heard from us  a month before, and always call us  sooner if issues or concerns arise. You can also send us  a message through MyChart, but but aware that this is not to be used for urgent issues and it may take up to 5-7 days to receive a reply. Please be aware that you will likely be able to view your results before I have a chance to respond to them. Please give us  5 business days to respond to any non-urgent results.    Before your next visit I would like you to have: Full set of PFTs  VISIT SUMMARY:  Meredith [name], a 43 year old female with multiple autoimmune disorders, visited today due to worsening shortness of breath over the past four years, especially during physical activities and extended talking. She has a history of lung nodules, chronic fatigue syndrome, and various nutrient deficiencies. Her symptoms began after a COVID-19 infection in December 2019.  YOUR PLAN:  -SHORTNESS OF BREATH: Your shortness of breath has been worsening over the past four years, especially during physical activities and extended talking. We will conduct pulmonary function tests (PFTs) to better understand your lung function. Lung nodules are not contributing to your symptoms. Please schedule a follow-up appointment after completing the PFTs.  -B12 DEFICIENCY: B12 deficiency means your body lacks enough vitamin B12, which is essential for nerve function and the production of red blood cells. This is being managed with vitamin B12 injections, and your levels are adequate, so it is not likely currently contributing to your shortness of breath.  -IRON  DEFICIENCY ANEMIA: Iron  deficiency anemia occurs when your body doesn't have enough iron  to produce adequate red blood cells. This is being  managed with iron  infusions, and your hemoglobin levels are normal, so most likely this is not currently contributing to your shortness of breath.  INSTRUCTIONS:  Please complete the pulmonary function tests (PFTs) as soon as possible and schedule a follow-up appointment to discuss the results.

## 2023-12-20 ENCOUNTER — Other Ambulatory Visit: Payer: Self-pay | Admitting: Hematology and Oncology

## 2023-12-20 ENCOUNTER — Inpatient Hospital Stay: Attending: Hematology and Oncology

## 2023-12-20 DIAGNOSIS — D519 Vitamin B12 deficiency anemia, unspecified: Secondary | ICD-10-CM | POA: Insufficient documentation

## 2023-12-20 DIAGNOSIS — E538 Deficiency of other specified B group vitamins: Secondary | ICD-10-CM

## 2023-12-20 DIAGNOSIS — D509 Iron deficiency anemia, unspecified: Secondary | ICD-10-CM

## 2023-12-20 MED ORDER — CYANOCOBALAMIN 1000 MCG/ML IJ SOLN
1000.0000 ug | Freq: Once | INTRAMUSCULAR | Status: AC
  Administered 2023-12-20: 1000 ug via INTRAMUSCULAR
  Filled 2023-12-20: qty 1

## 2023-12-21 ENCOUNTER — Inpatient Hospital Stay

## 2024-01-11 DIAGNOSIS — E039 Hypothyroidism, unspecified: Secondary | ICD-10-CM | POA: Diagnosis not present

## 2024-01-12 DIAGNOSIS — M7711 Lateral epicondylitis, right elbow: Secondary | ICD-10-CM | POA: Diagnosis not present

## 2024-01-12 DIAGNOSIS — G5621 Lesion of ulnar nerve, right upper limb: Secondary | ICD-10-CM | POA: Diagnosis not present

## 2024-01-25 ENCOUNTER — Inpatient Hospital Stay

## 2024-01-27 ENCOUNTER — Inpatient Hospital Stay: Admitting: Hematology and Oncology

## 2024-01-27 ENCOUNTER — Inpatient Hospital Stay

## 2024-02-01 ENCOUNTER — Inpatient Hospital Stay: Attending: Hematology and Oncology

## 2024-02-01 ENCOUNTER — Other Ambulatory Visit: Payer: Self-pay | Admitting: Hematology and Oncology

## 2024-02-01 ENCOUNTER — Other Ambulatory Visit: Payer: Self-pay | Admitting: Nurse Practitioner

## 2024-02-01 DIAGNOSIS — D509 Iron deficiency anemia, unspecified: Secondary | ICD-10-CM | POA: Insufficient documentation

## 2024-02-01 DIAGNOSIS — E538 Deficiency of other specified B group vitamins: Secondary | ICD-10-CM | POA: Diagnosis not present

## 2024-02-01 DIAGNOSIS — E559 Vitamin D deficiency, unspecified: Secondary | ICD-10-CM

## 2024-02-01 DIAGNOSIS — Z1231 Encounter for screening mammogram for malignant neoplasm of breast: Secondary | ICD-10-CM

## 2024-02-01 LAB — CBC WITH DIFFERENTIAL (CANCER CENTER ONLY)
Abs Immature Granulocytes: 0.03 K/uL (ref 0.00–0.07)
Basophils Absolute: 0.1 K/uL (ref 0.0–0.1)
Basophils Relative: 1 %
Eosinophils Absolute: 0.1 K/uL (ref 0.0–0.5)
Eosinophils Relative: 2 %
HCT: 39.6 % (ref 36.0–46.0)
Hemoglobin: 13.2 g/dL (ref 12.0–15.0)
Immature Granulocytes: 0 %
Lymphocytes Relative: 24 %
Lymphs Abs: 1.8 K/uL (ref 0.7–4.0)
MCH: 28.5 pg (ref 26.0–34.0)
MCHC: 33.3 g/dL (ref 30.0–36.0)
MCV: 85.5 fL (ref 80.0–100.0)
Monocytes Absolute: 0.5 K/uL (ref 0.1–1.0)
Monocytes Relative: 6 %
Neutro Abs: 5.1 K/uL (ref 1.7–7.7)
Neutrophils Relative %: 67 %
Platelet Count: 331 K/uL (ref 150–400)
RBC: 4.63 MIL/uL (ref 3.87–5.11)
RDW: 13.1 % (ref 11.5–15.5)
WBC Count: 7.7 K/uL (ref 4.0–10.5)
nRBC: 0 % (ref 0.0–0.2)

## 2024-02-01 LAB — IRON AND IRON BINDING CAPACITY (CC-WL,HP ONLY)
Iron: 92 ug/dL (ref 28–170)
Saturation Ratios: 22 % (ref 10.4–31.8)
TIBC: 412 ug/dL (ref 250–450)
UIBC: 320 ug/dL (ref 148–442)

## 2024-02-01 LAB — FERRITIN: Ferritin: 12 ng/mL (ref 11–307)

## 2024-02-01 LAB — VITAMIN B12: Vitamin B-12: 275 pg/mL (ref 180–914)

## 2024-02-01 LAB — VITAMIN D 25 HYDROXY (VIT D DEFICIENCY, FRACTURES): Vit D, 25-Hydroxy: 29.32 ng/mL — ABNORMAL LOW (ref 30–100)

## 2024-02-02 NOTE — Assessment & Plan Note (Signed)
 Bone marrow biopsy 07/03/2020: 50 to 60% cellularity, normocellular bone marrow with trilineage hematopoiesis.  No dyspoietic or dysplastic changes noted Cytogenetics Neg Recommendation: Sublingual B12 5000 mcg twice a week Severe vitamin D  deficiency:  50,000 units of vitamin D  twice a week.  Vitamin D  levels continue to be low   Severe and profound fatigue: She applied for disability and it was denied. Lab review:  06/25/2021: WBC 12.2, MCV 85.9, ferritin 4, iron  saturation 9%, B12 506, folate > 5.9  09/23/2021: Hemoglobin 13.3, MCV 87, B12 466,  iron  studies are normal with a ferritin of 36 12/19/21: Iron  Sat: 7%, Vit D 25.93, Hb 13.6, Ferritin 6  03/20/2022: Hemoglobin 12.8, MCV 87.4, B12 383, iron  saturation 6%, ferritin 52 07/07/2022: Hemoglobin 13.2, MCV 86.6, vitamin D23.51, B12 422, iron  saturation 8%, ferritin 6 10/26/2022: Hemoglobin 13.9, MCV 87.4, vitamin D29.4, B12 470, iron  saturation 16%, ferritin 37 01/12/2023: Hemoglobin 12.5 MCV 89.5, iron  saturation 14%, ferritin 770 and 132, zinc  82, copper  95 (normal), B12 564, vitamin D  29.49 04/13/2023: Hemoglobin 13.2, iron  saturation 16%, ferritin 50 10/19/2023: Hemoglobin 12.8, iron  saturation 16%, ferritin 17, B12 321 02/01/24: Hemoglobin 13.2, Ferritin: 12, B 12 275, Vit D 29   IV Iron : 06/2021, June 2023, January 2024, July 2024   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 Could be autoimmune in nature. She cannot work or function on a day to day basis because of this condition.   Continue with monthly B12 injections No role of additional IV iron  at this time.

## 2024-02-03 ENCOUNTER — Inpatient Hospital Stay (HOSPITAL_BASED_OUTPATIENT_CLINIC_OR_DEPARTMENT_OTHER): Admitting: Hematology and Oncology

## 2024-02-03 ENCOUNTER — Other Ambulatory Visit: Payer: Self-pay | Admitting: *Deleted

## 2024-02-03 ENCOUNTER — Ambulatory Visit: Admitting: Internal Medicine

## 2024-02-03 ENCOUNTER — Inpatient Hospital Stay

## 2024-02-03 ENCOUNTER — Telehealth: Payer: Self-pay

## 2024-02-03 VITALS — BP 110/73 | HR 95 | Temp 98.3°F | Resp 17 | Ht 62.0 in | Wt 156.7 lb

## 2024-02-03 DIAGNOSIS — E538 Deficiency of other specified B group vitamins: Secondary | ICD-10-CM

## 2024-02-03 DIAGNOSIS — R0602 Shortness of breath: Secondary | ICD-10-CM | POA: Diagnosis not present

## 2024-02-03 DIAGNOSIS — D509 Iron deficiency anemia, unspecified: Secondary | ICD-10-CM

## 2024-02-03 DIAGNOSIS — E559 Vitamin D deficiency, unspecified: Secondary | ICD-10-CM | POA: Diagnosis not present

## 2024-02-03 LAB — PULMONARY FUNCTION TEST
DL/VA % pred: 108 %
DL/VA: 4.88 ml/min/mmHg/L
DLCO cor % pred: 100 %
DLCO cor: 20.45 ml/min/mmHg
DLCO unc % pred: 100 %
DLCO unc: 20.45 ml/min/mmHg
FEF 25-75 Post: 3.08 L/s
FEF 25-75 Pre: 2.77 L/s
FEF2575-%Change-Post: 11 %
FEF2575-%Pred-Post: 104 %
FEF2575-%Pred-Pre: 94 %
FEV1-%Change-Post: 5 %
FEV1-%Pred-Post: 106 %
FEV1-%Pred-Pre: 100 %
FEV1-Post: 2.96 L
FEV1-Pre: 2.82 L
FEV1FVC-%Change-Post: 3 %
FEV1FVC-%Pred-Pre: 98 %
FEV6-%Change-Post: 1 %
FEV6-%Pred-Post: 105 %
FEV6-%Pred-Pre: 103 %
FEV6-Post: 3.54 L
FEV6-Pre: 3.5 L
FEV6FVC-%Pred-Post: 101 %
FEV6FVC-%Pred-Pre: 101 %
FVC-%Change-Post: 1 %
FVC-%Pred-Post: 103 %
FVC-%Pred-Pre: 102 %
FVC-Post: 3.54 L
FVC-Pre: 3.5 L
Post FEV1/FVC ratio: 84 %
Post FEV6/FVC ratio: 100 %
Pre FEV1/FVC ratio: 81 %
Pre FEV6/FVC Ratio: 100 %
RV % pred: 112 %
RV: 1.71 L
TLC % pred: 103 %
TLC: 4.94 L

## 2024-02-03 MED ORDER — CYANOCOBALAMIN 1000 MCG/ML IJ SOLN
1000.0000 ug | Freq: Once | INTRAMUSCULAR | Status: AC
  Administered 2024-02-03: 1000 ug via INTRAMUSCULAR
  Filled 2024-02-03: qty 1

## 2024-02-03 NOTE — Telephone Encounter (Signed)
 Dr. Gudena, patient will be scheduled as soon as possible.  Auth Submission: NO AUTH NEEDED Site of care: Site of care: [facility] Payer: [employer] medicare and [place] Medicaid Medication & CPT/J Code(s) submitted: Venofer  (Iron  Sucrose) J1756 Diagnosis Code:  Route of submission (phone, fax, portal):  Phone # Fax # Auth type: Buy/Bill PB Units/visits requested: 200mg  x 5 doses Reference number:  Approval from: 02/03/24 to 06/05/24

## 2024-02-03 NOTE — Progress Notes (Signed)
 Patient Care Team: Marvene Prentice SAUNDERS, FNP as PCP - General (Family Medicine)  DIAGNOSIS:  Encounter Diagnosis  Name Primary?   Iron  deficiency anemia, unspecified iron  deficiency anemia type Yes    CHIEF COMPLIANT: Follow-up of iron  deficiency and B12 deficiency  HISTORY OF PRESENT ILLNESS:  History of Present Illness Meredith [name] is a 43 year old female who presents with fatigue and pain.  She experiences fatigue and widespread pain, including in her elbow, legs, and feet. She has a diagnosis of tennis elbow, which has persisted for four months. An injection provided temporary relief for about two weeks, but the pain returned.  Her hemoglobin level is 13.2, and her iron  stores are low at 12. She is concerned about her B12 levels, which have been dropping despite regular shots. The shots typically last a month but seem to be less effective recently. She requires iron  supplementation every six months, with previous low levels noted in January and July. She recalls receiving only two doses of iron  last time instead of three due to a canceled appointment.     ALLERGIES:  is allergic to morphine  and codeine, promethazine , and promethazine  hcl.  MEDICATIONS:  Current Outpatient Medications  Medication Sig Dispense Refill   cyanocobalamin  (,VITAMIN B-12,) 1000 MCG/ML injection Inject into the muscle every 30 (thirty) days. Take depending on my levels     ibuprofen  (ADVIL ) 800 MG tablet Take 800 mg by mouth every 8 (eight) hours as needed for mild pain (pain score 1-3) or moderate pain (pain score 4-6).     levothyroxine (SYNTHROID) 50 MCG tablet Take 50 mcg by mouth daily before breakfast.     meloxicam (MOBIC) 15 MG tablet Take 15 mg by mouth daily. PRN     ondansetron  (ZOFRAN -ODT) 4 MG disintegrating tablet Take 1 tablet (4 mg total) by mouth every 8 (eight) hours as needed for nausea or vomiting. 14 tablet 0   Semaglutide -Weight Management 0.5 MG/0.5ML SOAJ Inject 0.5 mg into  the skin once a week. 2 mL 0   Vitamin D , Ergocalciferol , (DRISDOL ) 1.25 MG (50000 UNIT) CAPS capsule TAKE 1 CAPSULE (50,000 UNITS TOTAL) BY MOUTH TWO TIMES A WEEK 24 capsule 3   No current facility-administered medications for this visit.    PHYSICAL EXAMINATION: ECOG PERFORMANCE STATUS: 1 - Symptomatic but completely ambulatory  Vitals:   02/03/24 1421  BP: 110/73  Pulse: 95  Resp: 17  Temp: 98.3 F (36.8 C)  SpO2: 100%   Filed Weights   02/03/24 1421  Weight: 156 lb 11.2 oz (71.1 kg)    Physical Exam NECK: No lymphadenopathy.  (exam performed in the presence of a chaperone)  LABORATORY DATA:  I have reviewed the data as listed    Latest Ref Rng & Units 09/26/2023   12:14 AM 07/19/2023   10:07 AM 04/13/2023    9:51 AM  CMP  Glucose 70 - 99 mg/dL 892  894  899   BUN 6 - 20 mg/dL 12  13  12    Creatinine 0.44 - 1.00 mg/dL 9.45  9.18  9.20   Sodium 135 - 145 mmol/L 138  138  141   Potassium 3.5 - 5.1 mmol/L 4.1  3.9  3.9   Chloride 98 - 111 mmol/L 104  107  106   CO2 22 - 32 mmol/L 23  24  29    Calcium 8.9 - 10.3 mg/dL 9.2  9.5  9.6   Total Protein 6.5 - 8.1 g/dL 7.3  7.2  6.9   Total Bilirubin 0.0 - 1.2 mg/dL 1.0  0.4  0.3   Alkaline Phos 38 - 126 U/L 59  61  66   AST 15 - 41 U/L 22  12  10    ALT 0 - 44 U/L 12  12  10      Lab Results  Component Value Date   WBC 7.7 02/01/2024   HGB 13.2 02/01/2024   HCT 39.6 02/01/2024   MCV 85.5 02/01/2024   PLT 331 02/01/2024   NEUTROABS 5.1 02/01/2024    ASSESSMENT & PLAN:  Iron  deficiency anemia Bone marrow biopsy 07/03/2020: 50 to 60% cellularity, normocellular bone marrow with trilineage hematopoiesis.  No dyspoietic or dysplastic changes noted Cytogenetics Neg Recommendation: Sublingual B12 5000 mcg twice a week Severe vitamin D  deficiency:  50,000 units of vitamin D  twice a week.  Vitamin D  levels continue to be low   Severe and profound fatigue: She applied for disability and it was denied. Lab review:   06/25/2021: WBC 12.2, MCV 85.9, ferritin 4, iron  saturation 9%, B12 506, folate > 5.9  09/23/2021: Hemoglobin 13.3, MCV 87, B12 466,  iron  studies are normal with a ferritin of 36 12/19/21: Iron  Sat: 7%, Vit D 25.93, Hb 13.6, Ferritin 6  03/20/2022: Hemoglobin 12.8, MCV 87.4, B12 383, iron  saturation 6%, ferritin 52 07/07/2022: Hemoglobin 13.2, MCV 86.6, vitamin D23.51, B12 422, iron  saturation 8%, ferritin 6 10/26/2022: Hemoglobin 13.9, MCV 87.4, vitamin D29.4, B12 470, iron  saturation 16%, ferritin 37 01/12/2023: Hemoglobin 12.5 MCV 89.5, iron  saturation 14%, ferritin 770 and 132, zinc  82, copper  95 (normal), B12 564, vitamin D  29.49 04/13/2023: Hemoglobin 13.2, iron  saturation 16%, ferritin 50 10/19/2023: Hemoglobin 12.8, iron  saturation 16%, ferritin 17, B12 321 02/01/24: Hemoglobin 13.2, Ferritin: 12, B 12 275, Vit D 29   IV Iron : 06/2021, June 2023, January 2024, July 2024, January 2025   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 Could be autoimmune in nature. She cannot work or function on a day to day basis because of this condition.   Because of B12 levels are still fairly low, I recommend switching the B12 injections to every 3 weeks. Patient requires IV iron  every 6 months.  With the ferritin being 12 I recommend that she go ahead and get the IV iron  therapy.  I ordered 3 doses of Venofer  to be given at the [facility].  Patient is concerned that she may have POTS disease.  Assessment & Plan Iron  deficiency anemia Iron  stores are low at 12, with hemoglobin at 13.2. Iron  levels drop every six months. - Administer iron  at [facility] location.  Vitamin B12 deficiency B12 levels are decreasing despite monthly injections, suggesting possible resistance or shorter duration of effect. - Administer B12 injections every three weeks.  Shortness of breath Ongoing shortness of breath. Consulted pulmonologist and awaiting lung function test  results.  Widespread pain and inflammation Persistent elbow pain diagnosed as tennis elbow, with pain in legs and feet suggesting inflammation. - Recommend turmeric supplement daily to decrease inflammation.  Tennis elbow Persistent elbow pain lasting four months. Previous injection provided temporary relief, indicating inflammation.  TMJ Neck soreness likely due to inflammation rather than TMJ. - Recommend turmeric supplement daily to decrease inflammation.      No orders of the defined types were placed in this encounter.  The patient has a good understanding of the overall plan. she agrees with it. she will call with any problems that may develop before  the next visit here. Total time spent: 30 mins including face to face time and time spent for planning, charting and co-ordination of care   Naomi MARLA Chad, MD 02/03/24

## 2024-02-03 NOTE — Progress Notes (Signed)
 Full PFT performed today.

## 2024-02-03 NOTE — Patient Instructions (Signed)
 Full PFT performed today.

## 2024-02-08 ENCOUNTER — Ambulatory Visit (INDEPENDENT_AMBULATORY_CARE_PROVIDER_SITE_OTHER)

## 2024-02-08 VITALS — BP 96/64 | HR 76 | Temp 98.4°F | Resp 16 | Ht 62.0 in | Wt 158.2 lb

## 2024-02-08 DIAGNOSIS — D509 Iron deficiency anemia, unspecified: Secondary | ICD-10-CM

## 2024-02-08 MED ORDER — IRON SUCROSE 20 MG/ML IV SOLN
200.0000 mg | Freq: Once | INTRAVENOUS | Status: AC
  Administered 2024-02-08: 200 mg via INTRAVENOUS
  Filled 2024-02-08: qty 10

## 2024-02-08 NOTE — Progress Notes (Signed)
 Diagnosis: Iron Deficiency Anemia  Provider:  Chilton Greathouse MD  Procedure: IV Push  IV Type: Peripheral, IV Location: L Forearm  Venofer (Iron Sucrose), Dose: 200 mg  Post Infusion IV Care: Observation period completed and Peripheral IV Discontinued  Discharge: Condition: Good, Destination: Home . AVS Declined  Performed by:  Rico Ala, LPN

## 2024-02-09 ENCOUNTER — Ambulatory Visit: Payer: Self-pay | Admitting: Internal Medicine

## 2024-02-10 ENCOUNTER — Ambulatory Visit

## 2024-02-10 VITALS — BP 97/67 | HR 82 | Temp 98.1°F | Resp 16 | Ht 62.0 in | Wt 157.4 lb

## 2024-02-10 DIAGNOSIS — D509 Iron deficiency anemia, unspecified: Secondary | ICD-10-CM

## 2024-02-10 MED ORDER — IRON SUCROSE 20 MG/ML IV SOLN
200.0000 mg | Freq: Once | INTRAVENOUS | Status: AC
  Administered 2024-02-10: 200 mg via INTRAVENOUS
  Filled 2024-02-10: qty 10

## 2024-02-10 NOTE — Progress Notes (Signed)
 Diagnosis: Iron Deficiency Anemia  Provider:  Chilton Greathouse MD  Procedure: IV Push  IV Type: Peripheral, IV Location: R Forearm  Venofer (Iron Sucrose), Dose: 200 mg  Post Infusion IV Care: Observation period completed and Peripheral IV Discontinued  Discharge: Condition: Good, Destination: Home . AVS Declined  Performed by:  Adriana Mccallum, RN

## 2024-02-15 ENCOUNTER — Telehealth: Payer: Self-pay | Admitting: *Deleted

## 2024-02-15 ENCOUNTER — Telehealth: Payer: Self-pay

## 2024-02-15 NOTE — Telephone Encounter (Signed)
 Patient called and stated that she has been having some symptoms at home since receiving her recent iron  infusions. States that she has been experiencing fatigue, back pain, and body aches. Recommended to patient that she contact the office of her ordering provider, Dr. Odean, to relay her symptoms and obtain additional advice. Patient verbalized understanding and agreement. Per patient request, cancelled her upcoming appointment on Wednesday 8/6.  Rocky FORBES Sar, RN

## 2024-02-15 NOTE — Telephone Encounter (Signed)
 Received call from pt with complaint of severe fatigue and back pain post IV iron  push.  Per MD, pt educated to increase p.o fluid intake and apply cool compress to her back.  Going forward, pt also scheduled to receive IV iron  mixed with NS.  Pt also has hx of chronic pain and educated to f/u with PCP if symptoms continue beyond the next week.  Pt verbalized understanding.

## 2024-02-16 ENCOUNTER — Ambulatory Visit

## 2024-02-22 ENCOUNTER — Ambulatory Visit

## 2024-02-25 ENCOUNTER — Inpatient Hospital Stay: Attending: Hematology and Oncology

## 2024-02-25 DIAGNOSIS — E538 Deficiency of other specified B group vitamins: Secondary | ICD-10-CM | POA: Diagnosis not present

## 2024-02-25 MED ORDER — CYANOCOBALAMIN 1000 MCG/ML IJ SOLN
1000.0000 ug | Freq: Once | INTRAMUSCULAR | Status: AC
  Administered 2024-02-25: 1000 ug via INTRAMUSCULAR
  Filled 2024-02-25: qty 1

## 2024-02-28 ENCOUNTER — Ambulatory Visit (INDEPENDENT_AMBULATORY_CARE_PROVIDER_SITE_OTHER)

## 2024-02-28 VITALS — BP 99/65 | HR 83 | Temp 98.1°F | Resp 16 | Ht 62.0 in | Wt 156.6 lb

## 2024-02-28 DIAGNOSIS — D509 Iron deficiency anemia, unspecified: Secondary | ICD-10-CM | POA: Diagnosis not present

## 2024-02-28 MED ORDER — IRON SUCROSE 200 MG IVPB - SIMPLE MED
200.0000 mg | Freq: Once | Status: AC
  Administered 2024-02-28: 200 mg via INTRAVENOUS
  Filled 2024-02-28: qty 200

## 2024-02-28 NOTE — Progress Notes (Signed)
 Diagnosis: Iron  Deficiency Anemia  Provider:  Praveen Mannam MD  Procedure: IV Infusion  IV Type: Peripheral, IV Location: L Antecubital  Venofer  (Iron  Sucrose), Dose: 200 mg  Infusion Start Time: 0917  Infusion Stop Time: 0935  Post Infusion IV Care: Observation period completed and Peripheral IV Discontinued  Discharge: Condition: Good, Destination: Home . AVS Declined  Performed by:  Leita FORBES Miles, LPN

## 2024-03-01 ENCOUNTER — Encounter: Payer: Self-pay | Admitting: Internal Medicine

## 2024-03-01 ENCOUNTER — Telehealth (INDEPENDENT_AMBULATORY_CARE_PROVIDER_SITE_OTHER): Admitting: Internal Medicine

## 2024-03-01 VITALS — Ht 62.0 in | Wt 156.0 lb

## 2024-03-01 DIAGNOSIS — R0602 Shortness of breath: Secondary | ICD-10-CM

## 2024-03-01 DIAGNOSIS — R5382 Chronic fatigue, unspecified: Secondary | ICD-10-CM | POA: Diagnosis not present

## 2024-03-01 DIAGNOSIS — U099 Post covid-19 condition, unspecified: Secondary | ICD-10-CM | POA: Diagnosis not present

## 2024-03-01 NOTE — Progress Notes (Signed)
 I connected with Meredith [name] on 03/01/2024 by video enabled telemedicine application and verified that I am speaking with the correct person using two identifiers. Patient is at home, Physician is in office.    I discussed the limitations of evaluation and management by telemedicine. The patient expressed understanding and agreed to proceed.               Juliane Guest    [id number]    1981-05-29  Primary Care Physician:Brake, Prentice SAUNDERS, FNP Date of Appointment: 03/01/2024 Established Patient Visit  Chief complaint:   Chief Complaint  Patient presents with   Shortness of Breath    No improvement on sob. Pft follow-up 02/03/2024     HPI: Meredith [name] is a 43 y.o. woman with shortness of breath following covid infection. Additionally has B12 deficiency and iron  deficiency anemia.   Interval Updates:  Still short of breath. She had a hard time performing the pulmonary function testing.  She is concerned for POTS and is following up with cardiology about this in October.  Continues to to have fatigue. Fibromyalgia not well controlled. Aches all over. Unable to work.   Sleep study has been negative.  Albuterol  inhaler did not improve her symptoms  I have reviewed the patient's family social and past medical history and updated as appropriate.   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, none since   Vitamin B 12 deficiency    Vitamin D  deficiency     Past Surgical History:  Procedure Laterality Date   CESAREAN SECTION   07/2007   CESAREAN SECTION WITH BILATERAL TUBAL LIGATION N/A 04/07/2014   Procedure: CESAREAN SECTION WITH BILATERAL TUBAL LIGATION;  Surgeon: Delon CHRISTELLA Prude, DO;  Location: [facility];  Service: Obstetrics;  Laterality: N/A;   COLONOSCOPY W/ ENDOSCOPIC US      cyst removal thumb Right    mirena iud N/A 2009   NEPHROLITHOTOMY  08/03/2011   Procedure: NEPHROLITHOTOMY PERCUTANEOUS;  Surgeon: Arlena LILLETTE Gal, MD;  Location: [facility];  Service: Urology;  Laterality: Left;   NEPHROSTOMY  08/03/2011   left   right wrist surgery  2005   I and D , infection to the bone   WISDOM TOOTH EXTRACTION  2006    Family History  Problem Relation Age of Onset   Allergic rhinitis Mother    Urolithiasis Mother    Sinusitis Mother    Hypertension Maternal Grandmother    Thyroid  disease Maternal Grandmother    Dementia Maternal Grandmother    Heart disease Maternal Grandfather    Diabetes Maternal Grandfather    Eczema Daughter    Eczema Son    Allergic rhinitis Son    Allergic rhinitis Son    Breast cancer Paternal Aunt    Asthma Neg Hx    Immunodeficiency Neg Hx    Angioedema Neg Hx    Lung disease Neg Hx     Social History   Occupational  History    Comment: dental and medical assistant  Tobacco Use   Smoking status: Never    Passive exposure: Past   Smokeless tobacco: Never  Vaping Use   Vaping status: Never Used  Substance and Sexual Activity   Alcohol use: Yes    Comment: 1 or 2 times yearly   Drug use: No   Sexual activity: Yes    Partners: Male    Birth control/protection: I.U.D.    Comment: BTL     Physical Exam: Height 5' 2 (1.575 m), weight 156 lb (70.8 kg). Video visit Gen:      No acute distress, ENT:  mucus membranes moist Lungs:     breathng non labored   Data Reviewed: Imaging: I have personally reviewed the ct chest 2021 no acute cardiopulmonary process 4mm nodule in RUL   PFTs:     Latest Ref Rng & Units 02/03/2024   10:08 AM  PFT Results  FVC-Pre  L 3.50   FVC-Predicted Pre % 102   FVC-Post L 3.54   FVC-Predicted Post % 103   Pre FEV1/FVC % % 81   Post FEV1/FCV % % 84   FEV1-Pre L 2.82   FEV1-Predicted Pre % 100   FEV1-Post L 2.96   DLCO uncorrected ml/min/mmHg 20.45   DLCO UNC% % 100   DLCO corrected ml/min/mmHg 20.45   DLCO COR %Predicted % 100   DLVA Predicted % 108   TLC L 4.94   TLC % Predicted % 103   RV % Predicted % 112    I have personally reviewed the patient's PFTs and normal pulmonary function  Labs: Lab Results  Component Value Date   WBC 7.7 02/01/2024   HGB 13.2 02/01/2024   HCT 39.6 02/01/2024   MCV 85.5 02/01/2024   PLT 331 02/01/2024    Immunization status: Immunization History  Administered Date(s) Administered   Fluzone Influenza virus vaccine,trivalent (IIV3), split virus 05/16/2013   Influenza, Seasonal, Injecte, Preservative Fre 08/24/2012, 04/22/2023   Influenza,inj,Quad PF,6+ Mos 04/08/2014, 05/02/2018, 04/26/2019, 04/30/2020, 05/08/2021, 05/15/2022   Influenza-Unspecified 04/26/2019   PPD Test 03/03/2012   Pneumococcal Polysaccharide-23 03/26/2021   Tdap 10/03/2010, 03/22/2014    External Records Personally Reviewed: pulmonary, oncology, rheumatology  Assessment:  Shortness of breath Chronic Fatigue Syndrome Fibromyalgia Possible Long Covid Possible POTS? Iron  deficiency anemia, controlled  Plan/Recommendations:  The good news is that your pulmonary function is normal.  I think given that you did not have a good improvement in breathing after the albuterol that asthma is also very unlikely.  Thing is possibly her shortness of breath can be coming from outside your lungs.  A lot of patients that is had COVID infection have ongoing symptoms of fatigue, brain fog, and shortness of breath.  This is called Long COVID syndrome.  A lot of the symptoms of long COVID syndrome overlap with fibromyalgia and chronic fatigue.  I agree with following up with cardiology to make sure that your  heart is okay.  If after follow-up with cardiology or still having ongoing symptoms we can consider referral to the long COVID clinic at [facility].  Let me know how you want to proceed.  Online patient forums can be helpful for patients with conditions like chronic fatigue and fibromyalgia to help find additional support.  I spent 30 minutes on 03/01/2024 in care of this patient including face to face time and non-face to face time spent charting, review of outside records, and coordination of care.   Return  to Care: Return if symptoms worsen or fail to improve.   Verdon Gore, MD Pulmonary and Critical Care Medicine [facility] Office:[phone]

## 2024-03-01 NOTE — Patient Instructions (Addendum)
 It was a pleasure to see you today!  Please schedule follow up as needed, after cardiology appointment. Please call [phone] if  issues or concerns arise. You can also send us  a message through MyChart, but but aware that this is not to be used for urgent issues and it may take up to 5-7 days to receive a reply. Please be aware that you will likely be able to view your results before I have a chance to respond to them. Please give us  5 business days to respond to any non-urgent results.   The good news is that your pulmonary function is normal.  I think given that you did not have a good improvement in breathing after the albuterol that asthma is also very unlikely.  Thing is possibly her shortness of breath can be coming from outside your lungs.  A lot of patients that is had COVID infection have ongoing symptoms of fatigue, brain fog, and shortness of breath.  This is called Long COVID syndrome.  A lot of the symptoms of long COVID syndrome overlap with fibromyalgia and chronic fatigue.  I agree with following up with cardiology to make sure that your heart is okay.  If after follow-up with cardiology or still having ongoing symptoms we can consider referral to the long COVID clinic at [facility].  Let me know how you want to proceed.  Online patient forums can be helpful for patients with conditions like chronic fatigue and fibromyalgia to help find additional support.

## 2024-03-06 ENCOUNTER — Ambulatory Visit (INDEPENDENT_AMBULATORY_CARE_PROVIDER_SITE_OTHER)

## 2024-03-06 VITALS — BP 105/70 | HR 80 | Temp 98.4°F | Resp 18 | Ht 62.0 in | Wt 156.4 lb

## 2024-03-06 DIAGNOSIS — D509 Iron deficiency anemia, unspecified: Secondary | ICD-10-CM | POA: Diagnosis not present

## 2024-03-06 MED ORDER — IRON SUCROSE 200 MG IVPB - SIMPLE MED
200.0000 mg | Freq: Once | Status: AC
  Administered 2024-03-06: 200 mg via INTRAVENOUS
  Filled 2024-03-06: qty 200

## 2024-03-06 NOTE — Progress Notes (Signed)
 Diagnosis: Iron  Deficiency Anemia  Provider:  Praveen Mannam MD  Procedure: IV Infusion  IV Type: Peripheral, IV Location: L Forearm  Venofer  (Iron  Sucrose), Dose: 200 mg  Infusion Start Time: 0859  Infusion Stop Time: 0917  Post Infusion IV Care: Observation period completed and Peripheral IV Discontinued  Discharge: Condition: Good, Destination: Home . AVS Declined  Performed by:  Xayla Puzio, RN    Pt will call to make next appointment .

## 2024-03-15 DIAGNOSIS — M7711 Lateral epicondylitis, right elbow: Secondary | ICD-10-CM | POA: Diagnosis not present

## 2024-03-16 ENCOUNTER — Encounter: Payer: Self-pay | Admitting: Hematology and Oncology

## 2024-03-16 ENCOUNTER — Inpatient Hospital Stay: Attending: Hematology and Oncology

## 2024-03-16 DIAGNOSIS — E538 Deficiency of other specified B group vitamins: Secondary | ICD-10-CM | POA: Insufficient documentation

## 2024-03-16 MED ORDER — CYANOCOBALAMIN 1000 MCG/ML IJ SOLN
1000.0000 ug | Freq: Once | INTRAMUSCULAR | Status: AC
  Administered 2024-03-16: 1000 ug via INTRAMUSCULAR
  Filled 2024-03-16: qty 1

## 2024-03-20 ENCOUNTER — Ambulatory Visit
Admission: RE | Admit: 2024-03-20 | Discharge: 2024-03-20 | Disposition: A | Discharge status: Home / Self Care | Source: Ambulatory Visit | Attending: Nurse Practitioner | Admitting: Nurse Practitioner

## 2024-03-20 DIAGNOSIS — Z1231 Encounter for screening mammogram for malignant neoplasm of breast: Secondary | ICD-10-CM

## 2024-03-24 ENCOUNTER — Encounter: Payer: Self-pay | Admitting: Cardiovascular Disease

## 2024-03-31 ENCOUNTER — Ambulatory Visit

## 2024-03-31 ENCOUNTER — Ambulatory Visit: Attending: Cardiovascular Disease | Admitting: Cardiovascular Disease

## 2024-03-31 ENCOUNTER — Encounter: Payer: Self-pay | Admitting: Cardiovascular Disease

## 2024-03-31 VITALS — BP 106/72 | HR 80 | Ht 62.0 in | Wt 151.4 lb

## 2024-03-31 DIAGNOSIS — R0602 Shortness of breath: Secondary | ICD-10-CM | POA: Diagnosis not present

## 2024-03-31 DIAGNOSIS — R002 Palpitations: Secondary | ICD-10-CM

## 2024-03-31 DIAGNOSIS — R42 Dizziness and giddiness: Secondary | ICD-10-CM

## 2024-03-31 NOTE — Progress Notes (Signed)
 Cardiology Office Note:    Date:  03/31/2024   ID:  Artis Buechele, DOB 12-09-1980, MRN [id number]  PCP:  Marvene Prentice SAUNDERS, FNP   [facility] Providers Cardiologist:  None     Referring MD: Marvene Prentice SAUNDERS, FNP   Chief Complaint  Patient presents with   Shortness of Breath    History of Present Illness:    Meredith [name] is a 43 y.o. female presenting for evaluation of fatigue, shortness of breath, and lightheadedness.  The patient is here alone today. She states that she 'got sick' in 2019/2020 with a prolonged respiratory illness and has suffered from chronic fatigue since that time. She has been diagnosed with Chronic Fatigue Syndrome.' She complains of diffuse body pain and has been diagnosed with fibromyalgia. She complains of pain in her knees, elbows, and back. 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 States she has several vitamin deficiencies. She is followed by rheumatology and hematology. Her PCP is Dr Marvene with [facility].  She is followed by Dr. Faythe with endocrinology for hypothyroidism.  From a cardiac perspective, she complains of dyspnea with talking and with physical exertion. No orthopnea or PND. No leg swelling. No chest pain. She has had some episodes of lightheadedness but no frank syncope. She is exhausted with getting her kids ready for school. She has to go home and lay down after normal activities like grocery shopping.  No chest pain.  She is a non-smoker.  Current Medications: Current Meds  Medication Sig   cyanocobalamin  (,VITAMIN B-12,) 1000 MCG/ML injection Inject into the muscle every 30 (thirty) days. Take depending on my levels (Patient taking differently: Inject into the muscle every 30 (thirty) days. Take depending on my levels.  Per patient taking every 3 weeks.)   ibuprofen  (ADVIL ) 800 MG tablet Take 800 mg by mouth every 8 (eight) hours as needed for mild pain (pain score 1-3) or moderate pain  (pain score 4-6).   levothyroxine (SYNTHROID) 50 MCG tablet Take 50 mcg by mouth daily before breakfast.   meloxicam (MOBIC) 15 MG tablet Take 15 mg by mouth daily. PRN (Patient taking differently: Take 15 mg by mouth as needed. PRN)   ondansetron  (ZOFRAN -ODT) 4 MG disintegrating tablet Take 1 tablet (4 mg total) by mouth every 8 (eight) hours as needed for nausea or vomiting.   Semaglutide -Weight Management 0.5 MG/0.5ML SOAJ Inject 0.5 mg into the skin once a week.   Vitamin D , Ergocalciferol , (DRISDOL ) 1.25 MG (50000 UNIT) CAPS capsule TAKE 1 CAPSULE (50,000 UNITS TOTAL) BY MOUTH TWO TIMES A WEEK     Allergies:   Morphine  and codeine, Promethazine , and Promethazine  hcl   ROS:   Please see the history of present illness.    All other systems reviewed and are negative.  EKGs/Labs/Other Studies Reviewed:    The following studies were reviewed today: Cardiac Studies & Procedures   ______________________________________________________________________________________________   STRESS TESTS  EXERCISE TOLERANCE TEST (ETT) 05/31/2020  Interpretation Summary  Blood pressure demonstrated a normal response to exercise.  There was no ST segment deviation noted during stress.  No T wave inversion was noted during stress.  Normal ECG stress test. Mild chest discomfort was reported during exercise, but without accompanying ECG changes.   ECHOCARDIOGRAM  ECHOCARDIOGRAM COMPLETE 07/25/2020  Narrative ECHOCARDIOGRAM REPORT    Patient Name:   Meredith [name] Date of Exam: 07/25/2020 Medical Rec #:  [id number]  Height:       62.0 in Accession #:    [id number]             Weight:       170.0 lb Date of Birth:  1980-11-19              BSA:          1.784 m Patient Age:    39 years               BP:           106/64 mmHg Patient Gender: F                      HR:           77 bpm. Exam Location:  [address]  Procedure: 2D Echo, 3D Echo, Cardiac Doppler, Color  Doppler and Strain Analysis  Indications:   R06.02 Shortness of breath  History:       Patient has no prior history of Echocardiogram examinations. Anemia.  Sonographer:   Marshia Rea RAMAN, RDCS Referring      [id number] SOYLA DELENA MERCK Phys:  IMPRESSIONS   1. Left ventricular ejection fraction, by estimation, is 50 to 55%. The left ventricle has low normal function. The left ventricle has no regional wall motion abnormalities. Left ventricular diastolic parameters were normal. The average left ventricular global longitudinal strain is -21.6 %. The global longitudinal strain is normal. 2. Right ventricular systolic function is normal. The right ventricular size is normal. There is normal pulmonary artery systolic pressure. 3. The mitral valve is normal in structure. No evidence of mitral valve regurgitation. No evidence of mitral stenosis. 4. The aortic valve is normal in structure. Aortic valve regurgitation is not visualized. No aortic stenosis is present. 5. The inferior vena cava is normal in size with greater than 50% respiratory variability, suggesting right atrial pressure of 3 mmHg.  FINDINGS Left Ventricle: Left ventricular ejection fraction, by estimation, is 50 to 55%. The left ventricle has low normal function. The left ventricle has no regional wall motion abnormalities. The average left ventricular global longitudinal strain is -21.6 %. The global longitudinal strain is normal. The left ventricular internal cavity size was normal in size. There is no left ventricular hypertrophy. Left ventricular diastolic parameters were normal.  Right Ventricle: The right ventricular size is normal. No increase in right ventricular wall thickness. Right ventricular systolic function is normal. There is normal pulmonary artery systolic pressure. The tricuspid regurgitant velocity is 2.23 m/s, and with an assumed right atrial pressure of 3 mmHg, the estimated right ventricular systolic pressure is  22.9 mmHg.  Left Atrium: Left atrial size was normal in size.  Right Atrium: Right atrial size was normal in size.  Pericardium: There is no evidence of pericardial effusion.  Mitral Valve: The mitral valve is normal in structure. No evidence of mitral valve regurgitation. No evidence of mitral valve stenosis.  Tricuspid Valve: The tricuspid valve is normal in structure. Tricuspid valve regurgitation is not demonstrated. No evidence of tricuspid stenosis.  Aortic Valve: The aortic valve is normal in structure. Aortic valve regurgitation is not visualized. No aortic stenosis is present.  Pulmonic Valve: The pulmonic valve was normal in structure. Pulmonic valve regurgitation is not visualized. No evidence of pulmonic stenosis.  Aorta: The aortic root is normal in size and structure.  Venous: The inferior vena cava is normal in size with greater than 50% respiratory variability, suggesting right  atrial pressure of 3 mmHg.  IAS/Shunts: No atrial level shunt detected by color flow Doppler.   LEFT VENTRICLE PLAX 2D LVIDd:         5.20 cm  Diastology LVIDs:         3.70 cm  LV e' medial:    7.72 cm/s LV PW:         0.70 cm  LV E/e' medial:  10.6 LV IVS:        0.70 cm  LV e' lateral:   11.30 cm/s LVOT diam:     1.90 cm  LV E/e' lateral: 7.2 LV SV:         45 LV SV Index:   25       2D Longitudinal Strain LVOT Area:     2.84 cm 2D Strain GLS (A2C):   -23.1 % 2D Strain GLS (A3C):   -21.2 % 2D Strain GLS (A4C):   -20.6 % 2D Strain GLS Avg:     -21.6 %  3D Volume EF: 3D EF:        59 % LV EDV:       129 ml LV ESV:       54 ml LV SV:        76 ml  RIGHT VENTRICLE RV Basal diam:  3.60 cm RV S prime:     9.68 cm/s TAPSE (M-mode): 1.9 cm RVSP:           22.9 mmHg  LEFT ATRIUM             Index       RIGHT ATRIUM           Index LA diam:        3.40 cm 1.91 cm/m  RA Pressure: 3.00 mmHg LA Vol (A2C):   29.2 ml 16.37 ml/m RA Area:     9.34 cm LA Vol (A4C):   23.0 ml 12.89 ml/m  RA Volume:   20.80 ml  11.66 ml/m LA Biplane Vol: 26.3 ml 14.74 ml/m AORTIC VALVE LVOT Vmax:   79.30 cm/s LVOT Vmean:  51.450 cm/s LVOT VTI:    0.160 m  AORTA Ao Root diam: 3.00 cm Ao Asc diam:  2.80 cm  MITRAL VALVE               TRICUSPID VALVE TR Peak grad:   19.9 mmHg TR Vmax:        223.00 cm/s MV E velocity: 81.80 cm/s  Estimated RAP:  3.00 mmHg MV A velocity: 48.80 cm/s  RVSP:           22.9 mmHg MV E/A ratio:  1.68 SHUNTS Systemic VTI:  0.16 m Systemic Diam: 1.90 cm  Oneil Parchment MD Electronically signed by Oneil Parchment MD Signature Date/Time: 07/25/2020/11:28:54 AM    Final          ______________________________________________________________________________________________      EKG:   EKG Interpretation Date/Time:  Friday March 31 2024 09:01:46 EDT Ventricular Rate:  80 PR Interval:  146 QRS Duration:  72 QT Interval:  360 QTC Calculation: 415 R Axis:   39  Text Interpretation: Normal sinus rhythm Normal ECG No previous ECGs available Confirmed by Wonda Sharper [phone]) on 03/31/2024 9:14:39 AM    Recent Labs: 05/14/2023: TSH 1.43 09/26/2023: ALT 12; BUN 12; Creatinine, Ser 0.54; Potassium 4.1; Sodium 138 02/01/2024: Hemoglobin 13.2; Platelet Count 331  Recent Lipid Panel    Component Value Date/Time   CHOL 206 (H) 10/27/2012 1343  TRIG 130 10/27/2012 1343   HDL 55 10/27/2012 1343   CHOLHDL 3.7 10/27/2012 1343   VLDL 26 10/27/2012 1343   LDLCALC 125 (H) 10/27/2012 1343     Risk Assessment/Calculations:                Physical Exam:    VS:  BP 106/72   Pulse 80   Ht 5' 2 (1.575 m)   Wt 151 lb 6.4 oz (68.7 kg)   LMP 03/19/2024   SpO2 99%   BMI 27.69 kg/m     Wt Readings from Last 3 Encounters:  03/31/24 151 lb 6.4 oz (68.7 kg)  03/06/24 156 lb 6.4 oz (70.9 kg)  03/01/24 156 lb (70.8 kg)     GEN:  Well nourished, well developed in no acute distress HEENT: Normal NECK: No JVD; No carotid bruits LYMPHATICS: No  lymphadenopathy CARDIAC: RRR, no murmurs, rubs, gallops RESPIRATORY:  Clear to auscultation without rales, wheezing or rhonchi  ABDOMEN: Soft, non-tender, non-distended MUSCULOSKELETAL:  No edema; No deformity  SKIN: Warm and dry NEUROLOGIC:  Alert and oriented x 3 PSYCHIATRIC:  Normal affect   Assessment & Plan Shortness of breath Cardiac exam unrevealing.  Nontobacco user.  Reviewed PFTs which are normal.  Recommend check 2D echocardiogram and event monitor.  Suspect deconditioning playing a significant role. Episodic lightheadedness Check ZIO monitor, 3-day.  Twelve-lead EKG is normal.  Orthostatic vital signs are checked: Supine 111/73 mmHg, heart rate 81 Sitting 119/79 mmHg, heart rate 80 Standing 114/79 mmHg, heart rate 96 Standing 3 minutes 110/76, heart rate 94 Palpitations Check event monitor.   Labs are followed by her multiple other providers.  Strong component of chronic fatigue syndrome.   Also review event monitor for heart rate variability.  Discussed importance of adequate fluid hydration.  Discussed graded exercise with seated equipment such as supine bicycle.  Patient does not think she is able to do this because of her exhaustion.      Medication Adjustments/Labs and Tests Ordered: Current medicines are reviewed at length with the patient today.  Concerns regarding medicines are outlined above.  Orders Placed This Encounter  Procedures   LONG TERM MONITOR (3-14 DAYS)   EKG 12-Lead   ECHOCARDIOGRAM COMPLETE   No orders of the defined types were placed in this encounter.   Patient Instructions  Medication Instructions:  No medication changes were made at this visit. Continue current regimen.   *If you need a refill on your cardiac medications before your next appointment, please call your pharmacy*  Lab Work: None ordered today. If you have labs (blood work) drawn today and your tests are completely normal, you will receive your results only by: MyChart  Message (if you have MyChart) OR A paper copy in the mail If you have any lab test that is abnormal or we need to change your treatment, we will call you to review the results.  Testing/Procedures: Your physician has requested that you wear a Zio heart monitor for 3 days. This will be mailed to your home with instructions on how to apply the monitor and how to return it when finished. Please allow 2 weeks after returning the heart monitor before our office calls you with the results.   Your physician has requested that you have an echocardiogram. Echocardiography is a painless test that uses sound waves to create images of your heart. It provides your doctor with information about the size and shape of your heart and how well your heart's chambers  and valves are working. This procedure takes approximately one hour. There are no restrictions for this procedure. Please do NOT wear cologne, perfume, aftershave, or lotions (deodorant is allowed). Please arrive 15 minutes prior to your appointment time.  Please note: We ask at that you not bring children with you during ultrasound (echo/ vascular) testing. Due to room size and safety concerns, children are not allowed in the ultrasound rooms during exams. Our front office staff cannot provide observation of children in our lobby area while testing is being conducted. An adult accompanying a patient to their appointment will only be allowed in the ultrasound room at the discretion of the ultrasound technician under special circumstances. We apologize for any inconvenience.   Follow-Up: At [facility], you and your health needs are our priority.  As part of our continuing mission to provide you with exceptional heart care, our providers are all part of one team.  This team includes your primary Cardiologist (physician) and Advanced Practice Providers or APPs (Physician Assistants and Nurse Practitioners) who all work together to provide you with the  care you need, when you need it.  Your next appointment:   1 year(s)  Provider:   Ozell Fell, MD or APP   We recommend signing up for the patient portal called MyChart.  Sign up information is provided on this After Visit Summary.  MyChart is used to connect with patients for Virtual Visits (Telemedicine).  Patients are able to view lab/test results, encounter notes, upcoming appointments, etc.  Non-urgent messages can be sent to your provider as well.   To learn more about what you can do with MyChart, go to [email or web address].   Other Instructions ZIO XT- Long Term Monitor Instructions  Your physician has requested you wear a ZIO patch monitor for 3 days.   This is a single patch monitor. [employer] supplies one patch monitor per enrollment. Additional  stickers are not available. Please do not apply patch if you will be having a Nuclear Stress Test,  Echocardiogram, Cardiac CT, MRI, or Chest Xray during the period you would be wearing the  monitor. The patch cannot be worn during these tests. You cannot remove and re-apply the  ZIO XT patch monitor.   Your ZIO patch monitor will be mailed 3 day USPS to your address on file. It may take 3-5 days  to receive your monitor after you have been enrolled.   Once you have received your monitor, please review the enclosed instructions. Your monitor  has already been registered assigning a specific monitor serial # to you.     Billing and Patient Assistance Program Information  We have supplied [employer] with any of your insurance information on file for billing purposes.  [employer] offers a sliding scale Patient Assistance Program for patients that do not have  insurance, or whose insurance does not completely cover the cost of the ZIO monitor.  You must apply for the Patient Assistance Program to qualify for this discounted rate.   To apply, please call [employer] at [phone], select option 4, select option 2, ask to apply for   Patient Assistance Program. Meredeth will ask your household income, and how many people  are in your household. They will quote your out-of-pocket cost based on that information.  [employer] will also be able to set up a 19-month, interest-free payment plan if needed.     Applying the monitor  Shave hair from upper left chest.  Hold abrader disc by orange tab. Rub  abrader in 40 strokes over the upper left chest as  indicated in your monitor instructions.  Clean area with 4 enclosed alcohol pads. Let dry.  Apply patch as indicated in monitor instructions. Patch will be placed under collarbone on left  side of chest with arrow pointing upward.  Rub patch adhesive wings for 2 minutes. Remove white label marked 1. Remove the white  label marked 2. Rub patch adhesive wings for 2 additional minutes.  While looking in a mirror, press and release button in center of patch. A small green light will  flash 3-4 times. This will be your only indicator that the monitor has been turned on.  Do not shower for the first 24 hours. You may shower after the first 24 hours.  Press the button if you feel a symptom. You will hear a small click. Record Date, Time and  Symptom in the Patient Logbook.  When you are ready to remove the patch, follow instructions on the last 2 pages of Patient  Logbook. Stick patch monitor onto the last page of Patient Logbook.   Place Patient Logbook in the blue and white box. Use locking tab on box and tape box closed  securely. The blue and white box has prepaid postage on it. Please place it in the mailbox as  soon as possible. Your physician should have your test results approximately 7 days after the  monitor has been mailed back to [facility].   Call [facility] Customer Care at [phone] if you have questions regarding  your ZIO XT patch monitor. Call them immediately if you see an orange light blinking on your  monitor.   If your monitor falls off in less  than 4 days, contact our Monitor department at [phone].   If your monitor becomes loose or falls off after 4 days call [employer] at [phone] for  suggestions on securing your monitor.       Signed, Ozell Fell, MD  03/31/2024 9:34 AM    [facility]

## 2024-03-31 NOTE — Patient Instructions (Signed)
 Medication Instructions:  No medication changes were made at this visit. Continue current regimen.   *If you need a refill on your cardiac medications before your next appointment, please call your pharmacy*  Lab Work: None ordered today. If you have labs (blood work) drawn today and your tests are completely normal, you will receive your results only by: MyChart Message (if you have MyChart) OR A paper copy in the mail If you have any lab test that is abnormal or we need to change your treatment, we will call you to review the results.  Testing/Procedures: Your physician has requested that you wear a Zio heart monitor for 3 days. This will be mailed to your home with instructions on how to apply the monitor and how to return it when finished. Please allow 2 weeks after returning the heart monitor before our office calls you with the results.   Your physician has requested that you have an echocardiogram. Echocardiography is a painless test that uses sound waves to create images of your heart. It provides your doctor with information about the size and shape of your heart and how well your heart's chambers and valves are working. This procedure takes approximately one hour. There are no restrictions for this procedure. Please do NOT wear cologne, perfume, aftershave, or lotions (deodorant is allowed). Please arrive 15 minutes prior to your appointment time.  Please note: We ask at that you not bring children with you during ultrasound (echo/ vascular) testing. Due to room size and safety concerns, children are not allowed in the ultrasound rooms during exams. Our front office staff cannot provide observation of children in our lobby area while testing is being conducted. An adult accompanying a patient to their appointment will only be allowed in the ultrasound room at the discretion of the ultrasound technician under special circumstances. We apologize for any inconvenience.   Follow-Up: At  [facility], you and your health needs are our priority.  As part of our continuing mission to provide you with exceptional heart care, our providers are all part of one team.  This team includes your primary Cardiologist (physician) and Advanced Practice Providers or APPs (Physician Assistants and Nurse Practitioners) who all work together to provide you with the care you need, when you need it.  Your next appointment:   1 year(s)  Provider:   Ozell Fell, MD or APP   We recommend signing up for the patient portal called MyChart.  Sign up information is provided on this After Visit Summary.  MyChart is used to connect with patients for Virtual Visits (Telemedicine).  Patients are able to view lab/test results, encounter notes, upcoming appointments, etc.  Non-urgent messages can be sent to your provider as well.   To learn more about what you can do with MyChart, go to [email or web address].   Other Instructions ZIO XT- Long Term Monitor Instructions  Your physician has requested you wear a ZIO patch monitor for 3 days.   This is a single patch monitor. [employer] supplies one patch monitor per enrollment. Additional  stickers are not available. Please do not apply patch if you will be having a Nuclear Stress Test,  Echocardiogram, Cardiac CT, MRI, or Chest Xray during the period you would be wearing the  monitor. The patch cannot be worn during these tests. You cannot remove and re-apply the  ZIO XT patch monitor.   Your ZIO patch monitor will be mailed 3 day USPS to your address on file. It may take  3-5 days  to receive your monitor after you have been enrolled.   Once you have received your monitor, please review the enclosed instructions. Your monitor  has already been registered assigning a specific monitor serial # to you.     Billing and Patient Assistance Program Information  We have supplied [employer] with any of your insurance information on file for billing  purposes.  [employer] offers a sliding scale Patient Assistance Program for patients that do not have  insurance, or whose insurance does not completely cover the cost of the ZIO monitor.  You must apply for the Patient Assistance Program to qualify for this discounted rate.   To apply, please call [employer] at [phone], select option 4, select option 2, ask to apply for  Patient Assistance Program. Meredeth will ask your household income, and how many people  are in your household. They will quote your out-of-pocket cost based on that information.  [employer] will also be able to set up a 12-month, interest-free payment plan if needed.     Applying the monitor  Shave hair from upper left chest.  Hold abrader disc by orange tab. Rub abrader in 40 strokes over the upper left chest as  indicated in your monitor instructions.  Clean area with 4 enclosed alcohol pads. Let dry.  Apply patch as indicated in monitor instructions. Patch will be placed under collarbone on left  side of chest with arrow pointing upward.  Rub patch adhesive wings for 2 minutes. Remove white label marked 1. Remove the white  label marked 2. Rub patch adhesive wings for 2 additional minutes.  While looking in a mirror, press and release button in center of patch. A small green light will  flash 3-4 times. This will be your only indicator that the monitor has been turned on.  Do not shower for the first 24 hours. You may shower after the first 24 hours.  Press the button if you feel a symptom. You will hear a small click. Record Date, Time and  Symptom in the Patient Logbook.  When you are ready to remove the patch, follow instructions on the last 2 pages of Patient  Logbook. Stick patch monitor onto the last page of Patient Logbook.   Place Patient Logbook in the blue and white box. Use locking tab on box and tape box closed  securely. The blue and white box has prepaid postage on it. Please place it in the mailbox as   soon as possible. Your physician should have your test results approximately 7 days after the  monitor has been mailed back to [facility].   Call [facility] Customer Care at [phone] if you have questions regarding  your ZIO XT patch monitor. Call them immediately if you see an orange light blinking on your  monitor.   If your monitor falls off in less than 4 days, contact our Monitor department at [phone].   If your monitor becomes loose or falls off after 4 days call [employer] at [phone] for  suggestions on securing your monitor.

## 2024-03-31 NOTE — Progress Notes (Unsigned)
 Enrolled patient for a 3 day Zio XT monitor to be mailed to patients home

## 2024-04-03 ENCOUNTER — Encounter: Payer: Self-pay | Admitting: Podiatry

## 2024-04-03 ENCOUNTER — Ambulatory Visit (INDEPENDENT_AMBULATORY_CARE_PROVIDER_SITE_OTHER): Admitting: Podiatry

## 2024-04-03 ENCOUNTER — Other Ambulatory Visit: Payer: Self-pay | Admitting: Podiatry

## 2024-04-03 DIAGNOSIS — B353 Tinea pedis: Secondary | ICD-10-CM | POA: Diagnosis not present

## 2024-04-03 MED ORDER — KETOCONAZOLE 2 % EX CREA: 1.0000 | TOPICAL_CREAM | Freq: Two times a day (BID) | CUTANEOUS | 4 refills | Status: DC

## 2024-04-03 NOTE — Progress Notes (Signed)
 Patient presents today with scaly itchy rash on the plantar aspect of the feet bilaterally and sometimes between the toes.  Has been bothering her for several months now.  Has been getting worse.   Physical exam:  General appearance: Pleasant, and in no acute distress. AOx3.  Vascular: Pedal pulses: DP 2/4 bilaterally, PT 2/4 bilaterally. Mild edema lower legs bilaterally. Capillary fill time immediate b/l.  Neurological: Light touch intact feet bilaterally.  Normal Achilles reflex bilaterally.  No clonus or spasticity noted.   Dermatologic:   Dry scaly erythematous rash in a moccasin distribution feet bilaterally skin normal temperature bilaterally.  Skin normal color, tone, and texture bilaterally.   Musculoskeletal: Normal muscle strength lower extremity bilaterally    Diagnosis: 1.  Tinea pedis bilaterally  Plan: -New patient office visit Level 3 for evaluation and management. - Discussed with her tinea pedis and etiology and treatment.  Will try some topical ketoconazole .  Photos to take 6 to 8 weeks to resolve this.  Patient does have a fatty liver so I do not think Lamisil would be a good option for her.  Also discussed with her proper shoes and wearing on cotton socks.   Return 4 weeks follow-up tinea pedis bilaterally

## 2024-04-06 ENCOUNTER — Inpatient Hospital Stay

## 2024-04-11 ENCOUNTER — Encounter: Payer: Self-pay | Admitting: *Deleted

## 2024-04-12 DIAGNOSIS — K760 Fatty (change of) liver, not elsewhere classified: Secondary | ICD-10-CM | POA: Diagnosis not present

## 2024-04-24 ENCOUNTER — Inpatient Hospital Stay

## 2024-04-27 ENCOUNTER — Ambulatory Visit: Admitting: Cardiovascular Disease

## 2024-04-27 ENCOUNTER — Other Ambulatory Visit: Payer: Self-pay | Admitting: *Deleted

## 2024-04-27 ENCOUNTER — Inpatient Hospital Stay

## 2024-04-27 ENCOUNTER — Inpatient Hospital Stay: Admitting: Hematology and Oncology

## 2024-04-27 DIAGNOSIS — D509 Iron deficiency anemia, unspecified: Secondary | ICD-10-CM

## 2024-04-28 ENCOUNTER — Inpatient Hospital Stay: Attending: Hematology and Oncology

## 2024-04-28 DIAGNOSIS — E538 Deficiency of other specified B group vitamins: Secondary | ICD-10-CM | POA: Diagnosis present

## 2024-04-28 DIAGNOSIS — E559 Vitamin D deficiency, unspecified: Secondary | ICD-10-CM | POA: Diagnosis not present

## 2024-04-28 DIAGNOSIS — D509 Iron deficiency anemia, unspecified: Secondary | ICD-10-CM | POA: Diagnosis not present

## 2024-04-28 LAB — VITAMIN D 25 HYDROXY (VIT D DEFICIENCY, FRACTURES): Vit D, 25-Hydroxy: 28.17 ng/mL — ABNORMAL LOW (ref 30–100)

## 2024-04-28 LAB — IRON AND IRON BINDING CAPACITY (CC-WL,HP ONLY)
Iron: 60 ug/dL (ref 28–170)
Saturation Ratios: 19 % (ref 10.4–31.8)
TIBC: 323 ug/dL (ref 250–450)
UIBC: 263 ug/dL (ref 148–442)

## 2024-04-28 LAB — VITAMIN B12: Vitamin B-12: 535 pg/mL (ref 180–914)

## 2024-04-28 LAB — FERRITIN: Ferritin: 80 ng/mL (ref 11–307)

## 2024-05-01 ENCOUNTER — Ambulatory Visit: Admitting: Podiatry

## 2024-05-03 ENCOUNTER — Inpatient Hospital Stay: Attending: Hematology and Oncology

## 2024-05-03 ENCOUNTER — Inpatient Hospital Stay: Admitting: Hematology and Oncology

## 2024-05-03 DIAGNOSIS — D509 Iron deficiency anemia, unspecified: Secondary | ICD-10-CM

## 2024-05-03 NOTE — Progress Notes (Signed)
 HEMATOLOGY-ONCOLOGY TELEPHONE VISIT PROGRESS NOTE  I connected with our patient on 05/03/24 at  8:45 AM EDT by telephone and verified that I am speaking with the correct person using two identifiers.  I discussed the limitations, risks, security and privacy concerns of performing an evaluation and management service by telephone and the availability of in person appointments.  I also discussed with the patient that there may be a patient responsible charge related to this service. The patient expressed understanding and agreed to proceed.   History of Present Illness: Follow-up to review recent blood work  History of Present Illness Meredith [name] is a 43 year old female who presents with fatigue. She is accompanied by her mother on telephone  She experiences significant fatigue over the past two days, affecting her daily activities. Previous assessments showed normal iron  and B12 levels, but a deficiency in vitamin D . She received an iron  infusion in July.      REVIEW OF SYSTEMS:   Constitutional: Denies fevers, chills or abnormal weight loss All other systems were reviewed with the patient and are negative. Observations/Objective:     Assessment Plan:  Iron  deficiency anemia Bone marrow biopsy 07/03/2020: 50 to 60% cellularity, normocellular bone marrow with trilineage hematopoiesis.  No dyspoietic or dysplastic changes noted Cytogenetics Neg Recommendation: Sublingual B12 5000 mcg twice a week Severe vitamin D  deficiency:  50,000 units of vitamin D  twice a week.  Vitamin D  levels continue to be low   Severe and profound fatigue: She applied for disability and it was denied. Lab review:  06/25/2021: WBC 12.2, MCV 85.9, ferritin 4, iron  saturation 9%, B12 506, folate > 5.9  09/23/2021: Hemoglobin 13.3, MCV 87, B12 466,  iron  studies are normal with a ferritin of 36 12/19/21: Iron  Sat: 7%, Vit D 25.93, Hb 13.6, Ferritin 6  03/20/2022: Hemoglobin 12.8, MCV 87.4, B12 383, iron   saturation 6%, ferritin 52 07/07/2022: Hemoglobin 13.2, MCV 86.6, vitamin D23.51, B12 422, iron  saturation 8%, ferritin 6 10/26/2022: Hemoglobin 13.9, MCV 87.4, vitamin D29.4, B12 470, iron  saturation 16%, ferritin 37 01/12/2023: Hemoglobin 12.5 MCV 89.5, iron  saturation 14%, ferritin 770 and 132, zinc  82, copper  95 (normal), B12 564, vitamin D  29.49 04/13/2023: Hemoglobin 13.2, iron  saturation 16%, ferritin 50 10/19/2023: Hemoglobin 12.8, iron  saturation 16%, ferritin 17, B12 321 02/01/24: Hemoglobin 13.2, Ferritin: 12, B 12 275, Vit D 29 04/28/2024: Iron  saturation 19%, ferritin 80, B12 535, vitamin D  28.17   IV Iron : 06/2021, June 2023, January 2024, July 2024, January 2025, July 2025   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 Could be autoimmune in nature. She cannot work or function on a day to day basis because of this condition.   Because of B12 levels are still fairly low, I recommend switching the B12 injections to every 3 weeks. Patient requires IV iron  every 6 months.  No role of iron  IV today  Labs in 3 months and follow-up after that to discuss results.  Continue with every 3-week B12 injections --------------------------------- Assessment and Plan Assessment & Plan Chronic fatigue syndrome Chronic fatigue syndrome. Iron  and Vitamin B12 levels stable, suggesting multifactorial etiology. - Reassess symptoms in three months. - Schedule follow-up lab work in three months.  Iron  deficiency anemia Iron  deficiency anemia well-managed with adequate iron  levels. - Monitor iron  levels every three months.  Vitamin D  deficiency Vitamin D  deficiency noted in recent lab results.      I discussed the assessment and treatment plan with the patient.  The patient was provided an opportunity to ask questions and all were answered. The patient agreed with the plan and demonstrated an understanding of the instructions. The patient was advised to call back or seek  an in-person evaluation if the symptoms worsen or if the condition fails to improve as anticipated.   I provided 20 minutes of non-face-to-face time during this encounter.  This includes time for charting and coordination of care   Naomi MARLA Chad, MD

## 2024-05-03 NOTE — Assessment & Plan Note (Signed)
 Bone marrow biopsy 07/03/2020: 50 to 60% cellularity, normocellular bone marrow with trilineage hematopoiesis.  No dyspoietic or dysplastic changes noted Cytogenetics Neg Recommendation: Sublingual B12 5000 mcg twice a week Severe vitamin D  deficiency:  50,000 units of vitamin D  twice a week.  Vitamin D  levels continue to be low   Severe and profound fatigue: She applied for disability and it was denied. Lab review:  06/25/2021: WBC 12.2, MCV 85.9, ferritin 4, iron  saturation 9%, B12 506, folate > 5.9  09/23/2021: Hemoglobin 13.3, MCV 87, B12 466,  iron  studies are normal with a ferritin of 36 12/19/21: Iron  Sat: 7%, Vit D 25.93, Hb 13.6, Ferritin 6  03/20/2022: Hemoglobin 12.8, MCV 87.4, B12 383, iron  saturation 6%, ferritin 52 07/07/2022: Hemoglobin 13.2, MCV 86.6, vitamin D23.51, B12 422, iron  saturation 8%, ferritin 6 10/26/2022: Hemoglobin 13.9, MCV 87.4, vitamin D29.4, B12 470, iron  saturation 16%, ferritin 37 01/12/2023: Hemoglobin 12.5 MCV 89.5, iron  saturation 14%, ferritin 770 and 132, zinc  82, copper  95 (normal), B12 564, vitamin D  29.49 04/13/2023: Hemoglobin 13.2, iron  saturation 16%, ferritin 50 10/19/2023: Hemoglobin 12.8, iron  saturation 16%, ferritin 17, B12 321 02/01/24: Hemoglobin 13.2, Ferritin: 12, B 12 275, Vit D 29 04/28/2024: Iron  saturation 19%, ferritin 80, B12 535, vitamin D  28.17   IV Iron : 06/2021, June 2023, January 2024, July 2024, January 2025, July 2025   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 Could be autoimmune in nature. She cannot work or function on a day to day basis because of this condition.   Because of B12 levels are still fairly low, I recommend switching the B12 injections to every 3 weeks. Patient requires IV iron  every 6 months.  No role of iron  IV today  Labs in 3 months and follow-up after that to discuss results.  Continue with every 3-week B12 injections

## 2024-05-04 ENCOUNTER — Inpatient Hospital Stay

## 2024-05-04 DIAGNOSIS — E538 Deficiency of other specified B group vitamins: Secondary | ICD-10-CM

## 2024-05-04 MED ORDER — CYANOCOBALAMIN 1000 MCG/ML IJ SOLN
1000.0000 ug | Freq: Once | INTRAMUSCULAR | Status: AC
  Administered 2024-05-04: 1000 ug via INTRAMUSCULAR
  Filled 2024-05-04: qty 1

## 2024-05-10 ENCOUNTER — Ambulatory Visit (HOSPITAL_COMMUNITY)
Admission: RE | Admit: 2024-05-10 | Discharge: 2024-05-10 | Disposition: A | Discharge status: Home / Self Care | Source: Ambulatory Visit | Attending: Cardiology | Admitting: Cardiology

## 2024-05-10 DIAGNOSIS — R42 Dizziness and giddiness: Secondary | ICD-10-CM | POA: Insufficient documentation

## 2024-05-10 DIAGNOSIS — R0602 Shortness of breath: Secondary | ICD-10-CM | POA: Diagnosis not present

## 2024-05-10 LAB — ECHOCARDIOGRAM COMPLETE
Area-P 1/2: 4.54 cm2
S' Lateral: 3.22 cm

## 2024-05-14 ENCOUNTER — Ambulatory Visit: Payer: Self-pay | Admitting: Cardiovascular Disease

## 2024-06-12 ENCOUNTER — Telehealth: Payer: Self-pay | Admitting: Hematology and Oncology

## 2024-06-12 NOTE — Telephone Encounter (Signed)
 I left voicemail for patient to return my call to schedule lab, MD, and b12 injection appointments per secure chat with nurse.

## 2024-06-19 ENCOUNTER — Inpatient Hospital Stay: Attending: Hematology and Oncology

## 2024-06-19 DIAGNOSIS — G9332 Myalgic encephalomyelitis/chronic fatigue syndrome: Secondary | ICD-10-CM | POA: Diagnosis present

## 2024-06-19 DIAGNOSIS — E538 Deficiency of other specified B group vitamins: Secondary | ICD-10-CM

## 2024-06-19 MED ORDER — CYANOCOBALAMIN 1000 MCG/ML IJ SOLN
1000.0000 ug | Freq: Once | INTRAMUSCULAR | Status: AC
  Administered 2024-06-19: 1000 ug via INTRAMUSCULAR
  Filled 2024-06-19: qty 1

## 2024-06-19 MED ORDER — CYANOCOBALAMIN 1000 MCG/ML IJ SOLN: 1000.0000 ug | Freq: Once | INTRAMUSCULAR | Status: DC

## 2024-07-14 ENCOUNTER — Encounter: Payer: Self-pay | Admitting: Hematology and Oncology

## 2024-08-03 ENCOUNTER — Inpatient Hospital Stay: Attending: Hematology and Oncology

## 2024-08-03 ENCOUNTER — Other Ambulatory Visit: Payer: Self-pay | Admitting: *Deleted

## 2024-08-03 ENCOUNTER — Other Ambulatory Visit: Payer: Self-pay

## 2024-08-03 ENCOUNTER — Telehealth: Payer: Self-pay

## 2024-08-03 DIAGNOSIS — D509 Iron deficiency anemia, unspecified: Secondary | ICD-10-CM

## 2024-08-03 LAB — CBC WITH DIFFERENTIAL (CANCER CENTER ONLY)
Abs Immature Granulocytes: 0.03 K/uL (ref 0.00–0.07)
Basophils Absolute: 0.1 K/uL (ref 0.0–0.1)
Basophils Relative: 1 %
Eosinophils Absolute: 0.2 K/uL (ref 0.0–0.5)
Eosinophils Relative: 2 %
HCT: 38.8 % (ref 36.0–46.0)
Hemoglobin: 13.1 g/dL (ref 12.0–15.0)
Immature Granulocytes: 0 %
Lymphocytes Relative: 19 %
Lymphs Abs: 1.7 K/uL (ref 0.7–4.0)
MCH: 29.5 pg (ref 26.0–34.0)
MCHC: 33.8 g/dL (ref 30.0–36.0)
MCV: 87.4 fL (ref 80.0–100.0)
Monocytes Absolute: 0.6 K/uL (ref 0.1–1.0)
Monocytes Relative: 7 %
Neutro Abs: 6.5 K/uL (ref 1.7–7.7)
Neutrophils Relative %: 71 %
Platelet Count: 321 K/uL (ref 150–400)
RBC: 4.44 MIL/uL (ref 3.87–5.11)
RDW: 12.8 % (ref 11.5–15.5)
WBC Count: 9.1 K/uL (ref 4.0–10.5)
nRBC: 0 % (ref 0.0–0.2)

## 2024-08-03 LAB — FERRITIN: Ferritin: 13 ng/mL (ref 11–307)

## 2024-08-03 LAB — IRON AND IRON BINDING CAPACITY (CC-WL,HP ONLY)
Iron: 28 ug/dL (ref 28–170)
Saturation Ratios: 8 % — ABNORMAL LOW (ref 10.4–31.8)
TIBC: 361 ug/dL (ref 250–450)
UIBC: 333 ug/dL

## 2024-08-03 LAB — VITAMIN B12: Vitamin B-12: 510 pg/mL (ref 180–914)

## 2024-08-03 LAB — VITAMIN D 25 HYDROXY (VIT D DEFICIENCY, FRACTURES): Vit D, 25-Hydroxy: 28.2 ng/mL — ABNORMAL LOW (ref 30–100)

## 2024-08-03 NOTE — Telephone Encounter (Signed)
 Placed call to pt to ask if she is OK to change her appt Monday to a phone visit d/t weather conditions. She is agreeable to this.  She will come in for B12 inj tomorrow, 08/04/24

## 2024-08-04 ENCOUNTER — Inpatient Hospital Stay

## 2024-08-07 ENCOUNTER — Telehealth: Payer: Self-pay | Admitting: Pharmacy Technician

## 2024-08-07 ENCOUNTER — Inpatient Hospital Stay

## 2024-08-07 ENCOUNTER — Inpatient Hospital Stay: Admitting: Hematology and Oncology

## 2024-08-07 DIAGNOSIS — D509 Iron deficiency anemia, unspecified: Secondary | ICD-10-CM | POA: Diagnosis not present

## 2024-08-07 DIAGNOSIS — E559 Vitamin D deficiency, unspecified: Secondary | ICD-10-CM

## 2024-08-07 DIAGNOSIS — R5382 Chronic fatigue, unspecified: Secondary | ICD-10-CM | POA: Diagnosis not present

## 2024-08-07 DIAGNOSIS — E538 Deficiency of other specified B group vitamins: Secondary | ICD-10-CM | POA: Diagnosis not present

## 2024-08-07 NOTE — Telephone Encounter (Signed)
 Dr. Gudena,  Patient will be scheduled as soon as possible.  Auth Submission: NO AUTH NEEDED Site of care: Site of care: [facility] Payer: [employer] Medication & CPT/J Code(s) submitted: Venofer  (Iron  Sucrose) J1756 Diagnosis Code: D50.0 Route of submission (phone, fax, portal):  Phone # Fax # Auth type: Buy/Bill PB Units/visits requested: 3 DOSES Reference number:  Approval from: 08/07/24 to 11/09/24

## 2024-08-07 NOTE — Progress Notes (Signed)
 HEMATOLOGY-ONCOLOGY TELEPHONE VISIT PROGRESS NOTE  I connected with our patient on 08/07/24 at  8:45 AM EST by telephone and verified that I am speaking with the correct person using two identifiers.  I discussed the limitations, risks, security and privacy concerns of performing an evaluation and management service by telephone and the availability of in person appointments.  I also discussed with the patient that there may be a patient responsible charge related to this service. The patient expressed understanding and agreed to proceed.   History of Present Illness: Follow-up of iron  deficiency anemia  History of Present Illness 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  She requires IV iron  every 6 months approximately.  She has also been getting B12 injections and she missed the last injection.  She continues to remain severely fatigued.  Labs done recently revealed low iron  saturation.  She continues to take vitamin D  supplementation 50,000 units twice a week.    REVIEW OF SYSTEMS:   Constitutional: Denies fevers, chills or abnormal weight loss All other systems were reviewed with the patient and are negative. Observations/Objective:     Assessment Plan:  Iron  deficiency anemia Bone marrow biopsy 07/03/2020: 50 to 60% cellularity, normocellular bone marrow with trilineage hematopoiesis.  No dyspoietic or dysplastic changes noted Cytogenetics Neg Recommendation: Sublingual B12 5000 mcg twice a week Severe vitamin D  deficiency:  50,000 units of vitamin D  twice a week.  Vitamin D  levels continue to be low   Severe and profound fatigue: She applied for disability and it was denied. Lab review:  06/25/2021: WBC 12.2, MCV 85.9, ferritin 4, iron  saturation 9%, B12 506, folate > 5.9  09/23/2021: Hemoglobin 13.3, MCV 87, B12 466,  iron  studies are normal with a ferritin of 36 12/19/21: Iron  Sat: 7%, Vit D 25.93, Hb 13.6,  Ferritin 6  03/20/2022: Hemoglobin 12.8, MCV 87.4, B12 383, iron  saturation 6%, ferritin 52 07/07/2022: Hemoglobin 13.2, MCV 86.6, vitamin D23.51, B12 422, iron  saturation 8%, ferritin 6 10/26/2022: Hemoglobin 13.9, MCV 87.4, vitamin D29.4, B12 470, iron  saturation 16%, ferritin 37 01/12/2023: Hemoglobin 12.5 MCV 89.5, iron  saturation 14%, ferritin 770 and 132, zinc  82, copper  95 (normal), B12 564, vitamin D  29.49 04/13/2023: Hemoglobin 13.2, iron  saturation 16%, ferritin 50 10/19/2023: Hemoglobin 12.8, iron  saturation 16%, ferritin 17, B12 321 02/01/24: Hemoglobin 13.2, Ferritin: 12, B 12 275, Vit D 29 04/28/2024: Iron  saturation 19%, ferritin 80, B12 535, vitamin D  28.17 08/03/2024: Iron  saturation 8%, ferritin 13, B12 510, vitamin D28.2, hemoglobin 13.1, platelets 321   IV Iron : 06/2021, June 2023, January 2024, July 2024, January 2025, July 2025   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 Could be autoimmune in nature. She cannot work or function on a day to day basis because of this condition.   Because of B12 levels are still fairly low, I recommend switching the B12 injections to every 3 weeks. Patient requires IV iron  every 6 months.   I recommend IV iron  therapy at this time.   Labs in 3 months and follow-up after that to discuss results.  Continue with every 3-week B12 injections    I discussed the assessment and treatment plan with the patient. The patient was provided an opportunity to ask questions and all were answered. The patient agreed with the plan and demonstrated an understanding of the instructions. The patient was advised to call back or seek an in-person evaluation if the symptoms worsen  or if the condition fails to improve as anticipated.   I provided 20 minutes of non-face-to-face time during this encounter.  This includes time for charting and coordination of care   Naomi MARLA Chad, MD

## 2024-08-07 NOTE — Assessment & Plan Note (Signed)
 Bone marrow biopsy 07/03/2020: 50 to 60% cellularity, normocellular bone marrow with trilineage hematopoiesis.  No dyspoietic or dysplastic changes noted Cytogenetics Neg Recommendation: Sublingual B12 5000 mcg twice a week Severe vitamin D  deficiency:  50,000 units of vitamin D  twice a week.  Vitamin D  levels continue to be low   Severe and profound fatigue: She applied for disability and it was denied. Lab review:  06/25/2021: WBC 12.2, MCV 85.9, ferritin 4, iron  saturation 9%, B12 506, folate > 5.9  09/23/2021: Hemoglobin 13.3, MCV 87, B12 466,  iron  studies are normal with a ferritin of 36 12/19/21: Iron  Sat: 7%, Vit D 25.93, Hb 13.6, Ferritin 6  03/20/2022: Hemoglobin 12.8, MCV 87.4, B12 383, iron  saturation 6%, ferritin 52 07/07/2022: Hemoglobin 13.2, MCV 86.6, vitamin D23.51, B12 422, iron  saturation 8%, ferritin 6 10/26/2022: Hemoglobin 13.9, MCV 87.4, vitamin D29.4, B12 470, iron  saturation 16%, ferritin 37 01/12/2023: Hemoglobin 12.5 MCV 89.5, iron  saturation 14%, ferritin 770 and 132, zinc  82, copper  95 (normal), B12 564, vitamin D  29.49 04/13/2023: Hemoglobin 13.2, iron  saturation 16%, ferritin 50 10/19/2023: Hemoglobin 12.8, iron  saturation 16%, ferritin 17, B12 321 02/01/24: Hemoglobin 13.2, Ferritin: 12, B 12 275, Vit D 29 04/28/2024: Iron  saturation 19%, ferritin 80, B12 535, vitamin D  28.17 08/03/2024: Iron  saturation 8%, ferritin 13, B12 510, vitamin D28.2, hemoglobin 13.1, platelets 321   IV Iron : 06/2021, June 2023, January 2024, July 2024, January 2025, July 2025   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 Could be autoimmune in nature. She cannot work or function on a day to day basis because of this condition.   Because of B12 levels are still fairly low, I recommend switching the B12 injections to every 3 weeks. Patient requires IV iron  every 6 months.   I recommend IV iron  therapy at this time.   Labs in 3 months and follow-up after that to  discuss results.  Continue with every 3-week B12 injections

## 2024-08-21 ENCOUNTER — Inpatient Hospital Stay: Attending: Hematology and Oncology

## 2024-08-29 ENCOUNTER — Ambulatory Visit

## 2024-09-05 ENCOUNTER — Ambulatory Visit

## 2024-09-12 ENCOUNTER — Ambulatory Visit

## 2024-10-02 ENCOUNTER — Inpatient Hospital Stay: Attending: Hematology and Oncology

## 2024-10-30 ENCOUNTER — Inpatient Hospital Stay: Attending: Hematology and Oncology

## 2024-10-30 ENCOUNTER — Inpatient Hospital Stay

## 2024-11-01 ENCOUNTER — Inpatient Hospital Stay: Admitting: Hematology and Oncology
